# Patient Record
Sex: Female | Born: 1940 | Race: White | Hispanic: No | Marital: Married | Smoking: Never smoker
Health system: Southern US, Community
[De-identification: ages and names within clinical notes are randomized; demographics above are authoritative.]

## PROBLEM LIST (undated history)

## (undated) DIAGNOSIS — I639 Cerebral infarction, unspecified: Secondary | ICD-10-CM

## (undated) DIAGNOSIS — F329 Major depressive disorder, single episode, unspecified: Secondary | ICD-10-CM

## (undated) DIAGNOSIS — F32A Depression, unspecified: Secondary | ICD-10-CM

## (undated) DIAGNOSIS — K219 Gastro-esophageal reflux disease without esophagitis: Secondary | ICD-10-CM

## (undated) DIAGNOSIS — I10 Essential (primary) hypertension: Secondary | ICD-10-CM

## (undated) DIAGNOSIS — M1990 Unspecified osteoarthritis, unspecified site: Secondary | ICD-10-CM

## (undated) HISTORY — PX: WISDOM TOOTH EXTRACTION: SHX21

## (undated) HISTORY — PX: HIATAL HERNIA REPAIR: SHX195

---

## 1998-07-31 ENCOUNTER — Other Ambulatory Visit: Admission: RE | Admit: 1998-07-31 | Discharge: 1998-07-31 | Payer: Self-pay | Admitting: Obstetrics & Gynecology

## 2000-06-22 ENCOUNTER — Encounter: Admission: RE | Admit: 2000-06-22 | Discharge: 2000-06-22 | Payer: Self-pay | Admitting: Obstetrics & Gynecology

## 2000-06-22 ENCOUNTER — Encounter: Payer: Self-pay | Admitting: Obstetrics & Gynecology

## 2000-08-03 ENCOUNTER — Other Ambulatory Visit: Admission: RE | Admit: 2000-08-03 | Discharge: 2000-08-03 | Payer: Self-pay | Admitting: Obstetrics & Gynecology

## 2001-06-24 ENCOUNTER — Encounter: Admission: RE | Admit: 2001-06-24 | Discharge: 2001-06-24 | Payer: Self-pay | Admitting: Internal Medicine

## 2001-06-24 ENCOUNTER — Encounter: Payer: Self-pay | Admitting: Internal Medicine

## 2001-09-13 ENCOUNTER — Other Ambulatory Visit: Admission: RE | Admit: 2001-09-13 | Discharge: 2001-09-13 | Payer: Self-pay | Admitting: Obstetrics & Gynecology

## 2002-04-14 ENCOUNTER — Encounter: Admission: RE | Admit: 2002-04-14 | Discharge: 2002-04-14 | Payer: Self-pay | Admitting: Internal Medicine

## 2002-04-14 ENCOUNTER — Encounter: Payer: Self-pay | Admitting: Internal Medicine

## 2002-09-12 ENCOUNTER — Encounter: Payer: Self-pay | Admitting: Obstetrics & Gynecology

## 2002-09-12 ENCOUNTER — Encounter: Admission: RE | Admit: 2002-09-12 | Discharge: 2002-09-12 | Payer: Self-pay | Admitting: Obstetrics & Gynecology

## 2002-10-30 ENCOUNTER — Other Ambulatory Visit: Admission: RE | Admit: 2002-10-30 | Discharge: 2002-10-30 | Payer: Self-pay | Admitting: Obstetrics & Gynecology

## 2003-12-25 ENCOUNTER — Other Ambulatory Visit: Admission: RE | Admit: 2003-12-25 | Discharge: 2003-12-25 | Payer: Self-pay | Admitting: Obstetrics & Gynecology

## 2004-05-06 ENCOUNTER — Encounter: Admission: RE | Admit: 2004-05-06 | Discharge: 2004-05-06 | Payer: Self-pay | Admitting: Obstetrics & Gynecology

## 2004-06-18 ENCOUNTER — Other Ambulatory Visit: Admission: RE | Admit: 2004-06-18 | Discharge: 2004-06-18 | Payer: Self-pay | Admitting: Obstetrics & Gynecology

## 2004-12-10 ENCOUNTER — Other Ambulatory Visit: Admission: RE | Admit: 2004-12-10 | Discharge: 2004-12-10 | Payer: Self-pay | Admitting: Obstetrics & Gynecology

## 2005-06-17 ENCOUNTER — Encounter: Admission: RE | Admit: 2005-06-17 | Discharge: 2005-06-17 | Payer: Self-pay | Admitting: Obstetrics & Gynecology

## 2005-06-17 ENCOUNTER — Other Ambulatory Visit: Admission: RE | Admit: 2005-06-17 | Discharge: 2005-06-17 | Payer: Self-pay | Admitting: Obstetrics & Gynecology

## 2005-12-24 ENCOUNTER — Other Ambulatory Visit: Admission: RE | Admit: 2005-12-24 | Discharge: 2005-12-24 | Payer: Self-pay | Admitting: Obstetrics & Gynecology

## 2006-07-01 ENCOUNTER — Encounter: Admission: RE | Admit: 2006-07-01 | Discharge: 2006-07-01 | Payer: Self-pay | Admitting: Obstetrics & Gynecology

## 2007-11-16 ENCOUNTER — Observation Stay (HOSPITAL_COMMUNITY): Admission: EM | Admit: 2007-11-16 | Discharge: 2007-11-16 | Payer: Self-pay | Admitting: Emergency Medicine

## 2007-12-08 ENCOUNTER — Encounter: Admission: RE | Admit: 2007-12-08 | Discharge: 2007-12-08 | Payer: Self-pay | Admitting: Internal Medicine

## 2008-01-26 ENCOUNTER — Encounter: Admission: RE | Admit: 2008-01-26 | Discharge: 2008-01-26 | Payer: Self-pay | Admitting: General Surgery

## 2008-05-04 ENCOUNTER — Encounter: Admission: RE | Admit: 2008-05-04 | Discharge: 2008-05-04 | Payer: Self-pay | Admitting: Internal Medicine

## 2008-06-21 IMAGING — RF DG ESOPHAGUS
19 of 23 series · 19 of 24 positions shown · non-contrast
Comparison: [REDACTED] portable chest x-ray, 11/16/07.

CLINICAL DATA: Chest/arm pain.  Hiatus hernia.   Treatment for GERD.
DIAGNOSTIC BARIUM SWALLOW/ESOPHAGRAM:
TECHNIQUE: Double and single contrast technique with ingested 13 mm barium tablet.

[Series 1: run · 1 of 4 slices shown (1 of 19)]
[im 1/4]
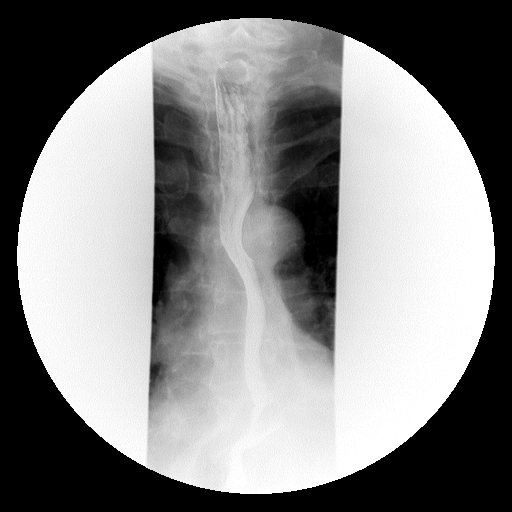

[Series 2: run · 1 of 14 slices shown (2 of 19)]
[im 1/14]
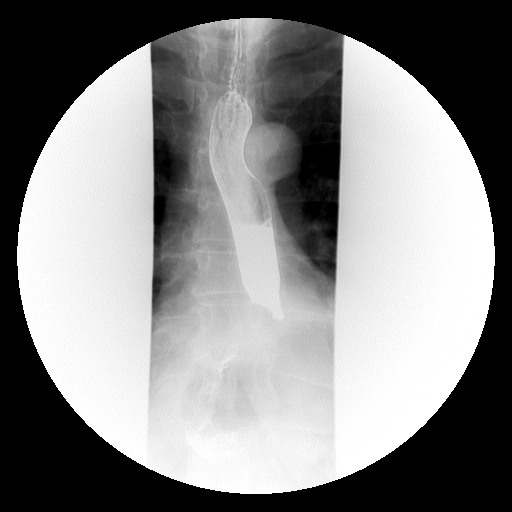

[Series 4: run · 1 of 1 slices shown (3 of 19)]
[im 1/1]
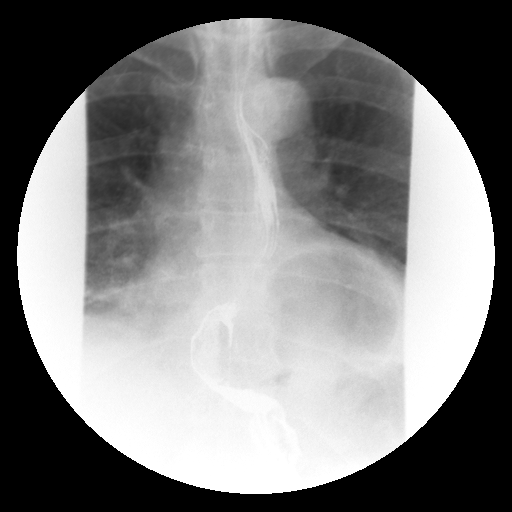

[Series 5: run · 1 of 6 slices shown (4 of 19)]
[im 1/6]
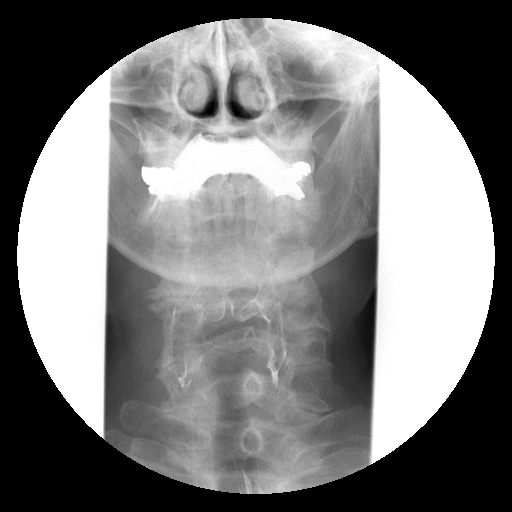

[Series 6: run · 1 of 1 slices shown (5 of 19)]
[im 1/1]
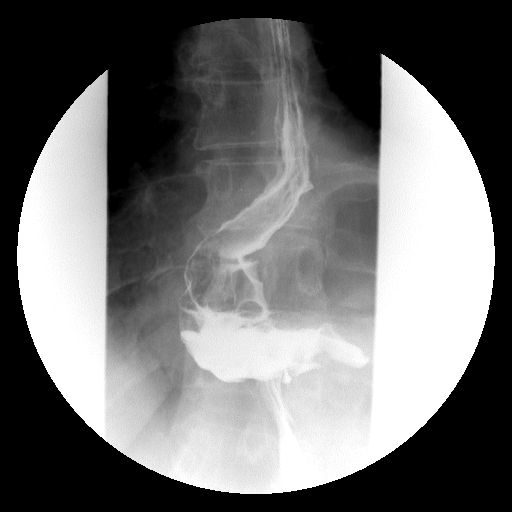

[Series 7: run · 1 of 5 slices shown (6 of 19)]
[im 1/5]
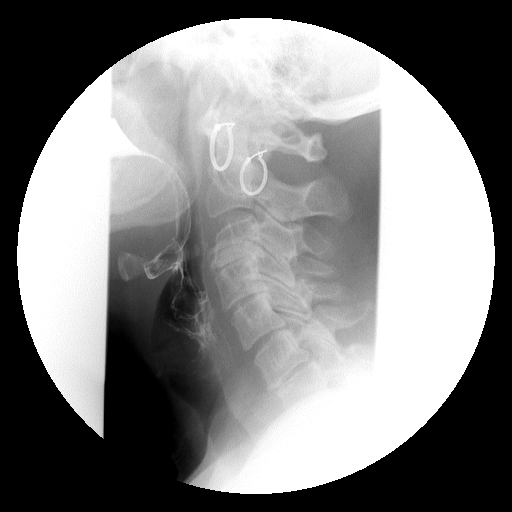

[Series 9: run · 1 of 5 slices shown (7 of 19)]
[im 1/5]
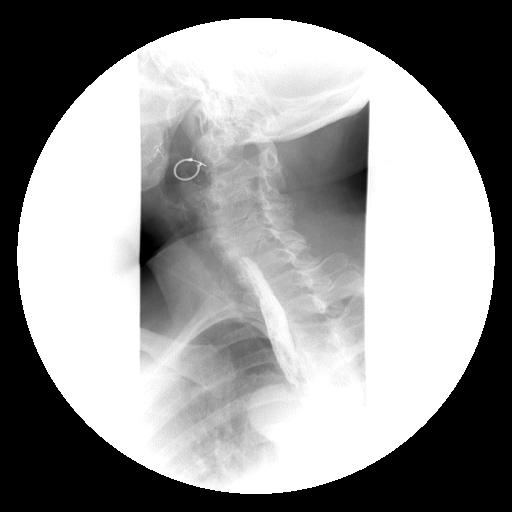

[Series 10: run · 1 of 3 slices shown (8 of 19)]
[im 1/3]
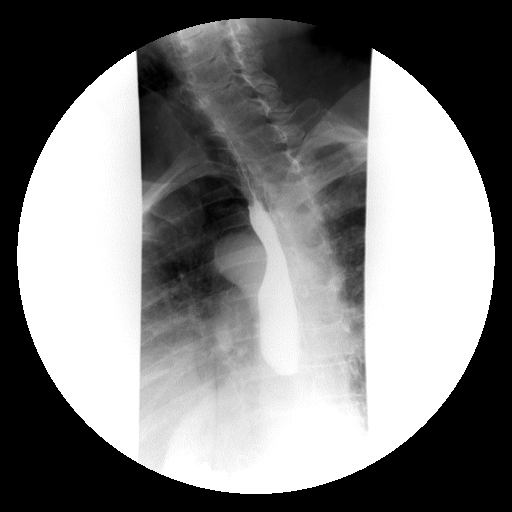

[Series 11: run · 1 of 22 slices shown (9 of 19)]
[im 1/22]
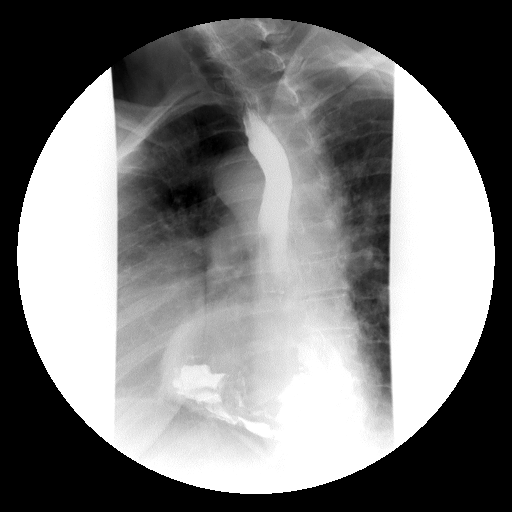

[Series 12: run · 1 of 30 slices shown (10 of 19)]
[im 30/30]
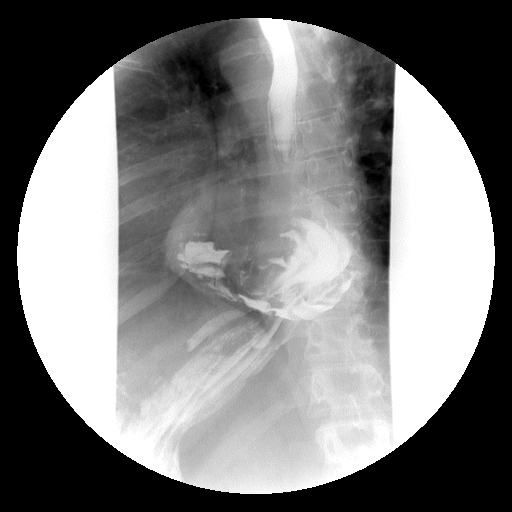

[Series 13: run · 1 of 8 slices shown (11 of 19)]
[im 1/8]
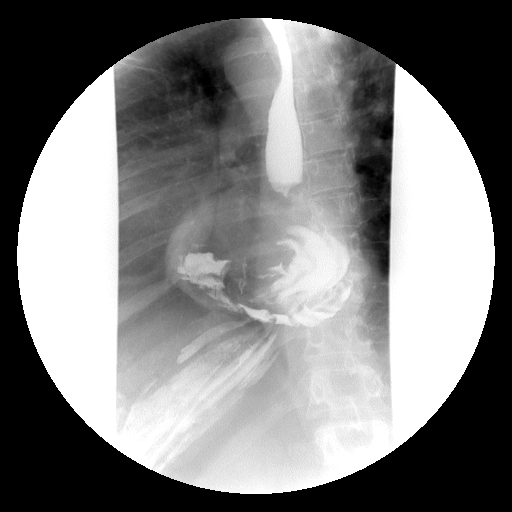

[Series 14: run · 1 of 1 slices shown (12 of 19)]
[im 1/1]
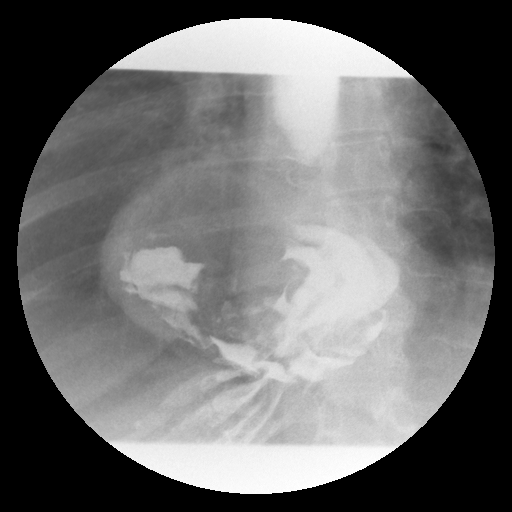

[Series 15: run · 1 of 1 slices shown (13 of 19)]
[im 1/1]
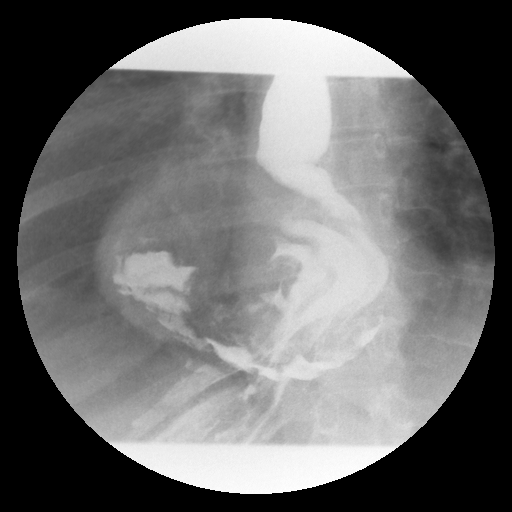

[Series 17: run · 1 of 1 slices shown (14 of 19)]
[im 1/1]
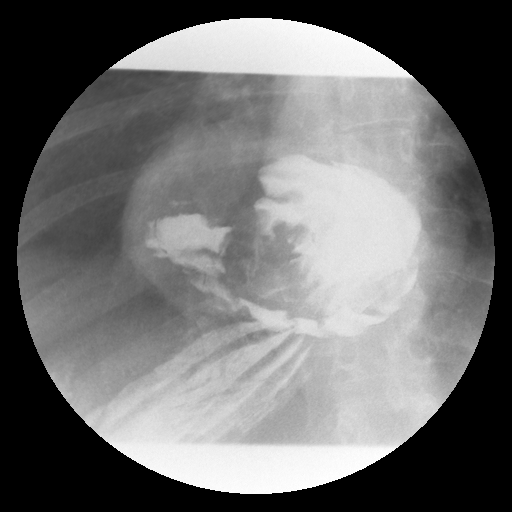

[Series 18: run · 1 of 1 slices shown (15 of 19)]
[im 1/1]
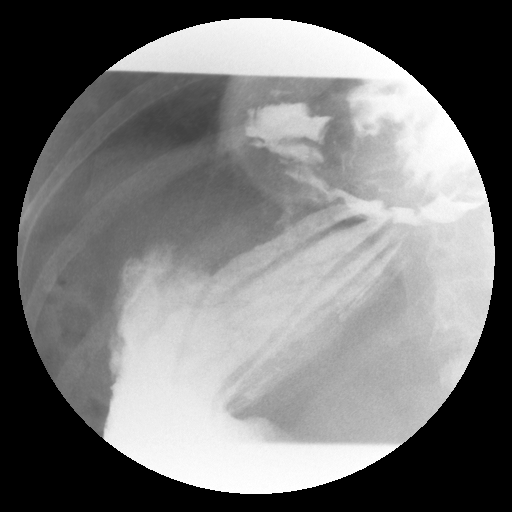

[Series 19: run · 1 of 1 slices shown (16 of 19)]
[im 1/1]
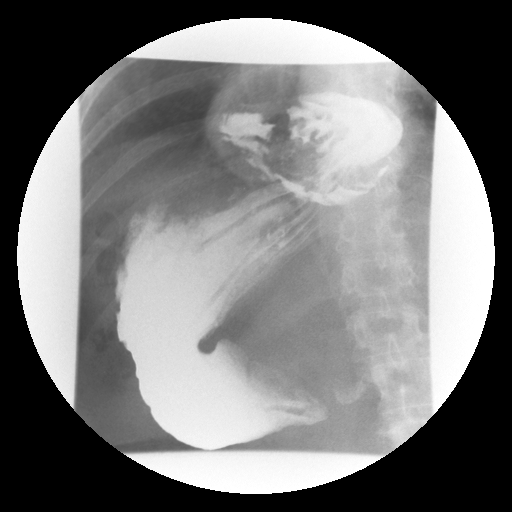

[Series 20: run · 1 of 1 slices shown (17 of 19)]
[im 1/1]
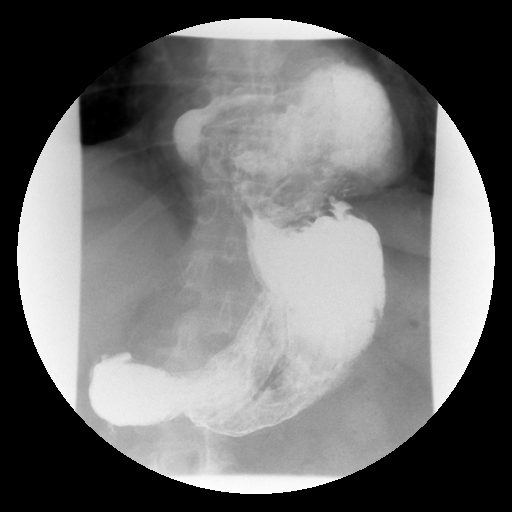

[Series 22: run · 1 of 1 slices shown (18 of 19)]
[im 1/1]
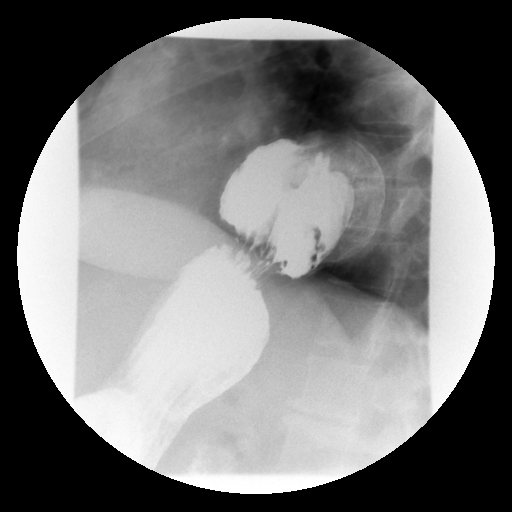

[Series 23: run · 1 of 1 slices shown (19 of 19)]
[im 1/1]
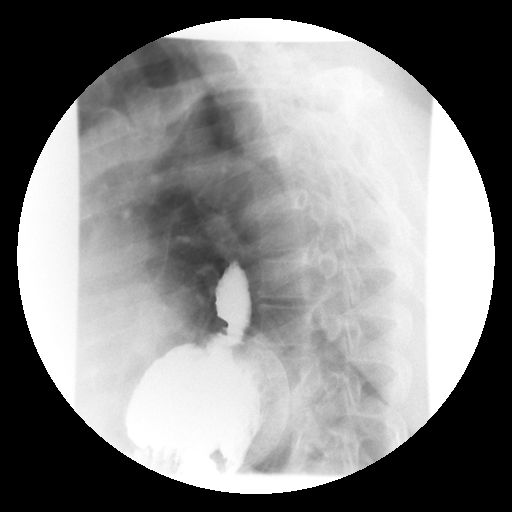

[19 of 24 positions shown; findings below may reference images not displayed]

FINDINGS: Moderately large (approximately one-third stomach) paraesophageal hiatus hernia is seen.  Normal antegrade peristalsis is seen through the cervical and thoracic esophagus with no obstruction to liquid barium nor ingested 13 mm barium tablet.  Slight spontaneous and Valsalva/water siphon induced gastroesophageal reflux was demonstrated in recumbent position.  No other significant intrinsic nor extrinsic esophageal nor gastric lesion is seen.   Advanced degenerative disk disease with slight posterior spondylosis C3-4 noted.
IMPRESSION: 1.  Moderately large (approximately one-third stomach) paraesophageal hernia.
2.  Slight gastroesophageal reflux. 
3.  Degenerative joint disease maximal C3-4 with slight posterior spondylosis.

## 2008-12-25 ENCOUNTER — Ambulatory Visit (HOSPITAL_COMMUNITY): Admission: RE | Admit: 2008-12-25 | Discharge: 2008-12-25 | Payer: Self-pay | Admitting: Gastroenterology

## 2009-01-03 ENCOUNTER — Encounter: Admission: RE | Admit: 2009-01-03 | Discharge: 2009-01-03 | Payer: Self-pay | Admitting: General Surgery

## 2009-02-22 ENCOUNTER — Inpatient Hospital Stay (HOSPITAL_COMMUNITY): Admission: RE | Admit: 2009-02-22 | Discharge: 2009-02-26 | Payer: Self-pay | Admitting: General Surgery

## 2009-02-22 ENCOUNTER — Encounter (INDEPENDENT_AMBULATORY_CARE_PROVIDER_SITE_OTHER): Payer: Self-pay | Admitting: General Surgery

## 2009-04-11 ENCOUNTER — Encounter: Admission: RE | Admit: 2009-04-11 | Discharge: 2009-04-11 | Payer: Self-pay | Admitting: General Surgery

## 2009-07-24 ENCOUNTER — Encounter: Admission: RE | Admit: 2009-07-24 | Discharge: 2009-07-24 | Payer: Self-pay | Admitting: Obstetrics & Gynecology

## 2009-09-25 ENCOUNTER — Encounter: Admission: RE | Admit: 2009-09-25 | Discharge: 2009-09-25 | Payer: Self-pay | Admitting: Orthopedic Surgery

## 2010-08-25 ENCOUNTER — Encounter: Admission: RE | Admit: 2010-08-25 | Discharge: 2010-08-25 | Payer: Self-pay | Admitting: Obstetrics & Gynecology

## 2010-11-18 ENCOUNTER — Encounter
Admission: RE | Admit: 2010-11-18 | Discharge: 2010-11-18 | Payer: Self-pay | Source: Home / Self Care | Attending: Internal Medicine | Admitting: Internal Medicine

## 2010-12-07 ENCOUNTER — Encounter: Payer: Self-pay | Admitting: Orthopedic Surgery

## 2011-02-07 ENCOUNTER — Emergency Department (HOSPITAL_COMMUNITY)
Admission: EM | Admit: 2011-02-07 | Discharge: 2011-02-07 | Disposition: A | Discharge status: Home / Self Care | Payer: Medicare Other | Attending: Emergency Medicine | Admitting: Emergency Medicine

## 2011-02-07 ENCOUNTER — Emergency Department (HOSPITAL_COMMUNITY): Payer: Medicare Other

## 2011-02-07 DIAGNOSIS — W1809XA Striking against other object with subsequent fall, initial encounter: Secondary | ICD-10-CM | POA: Insufficient documentation

## 2011-02-07 DIAGNOSIS — S0510XA Contusion of eyeball and orbital tissues, unspecified eye, initial encounter: Secondary | ICD-10-CM | POA: Insufficient documentation

## 2011-02-07 DIAGNOSIS — R51 Headache: Secondary | ICD-10-CM | POA: Insufficient documentation

## 2011-02-07 DIAGNOSIS — S0083XA Contusion of other part of head, initial encounter: Secondary | ICD-10-CM | POA: Insufficient documentation

## 2011-02-07 DIAGNOSIS — M542 Cervicalgia: Secondary | ICD-10-CM | POA: Insufficient documentation

## 2011-02-07 DIAGNOSIS — S0003XA Contusion of scalp, initial encounter: Secondary | ICD-10-CM | POA: Insufficient documentation

## 2011-02-07 DIAGNOSIS — I10 Essential (primary) hypertension: Secondary | ICD-10-CM | POA: Insufficient documentation

## 2011-02-07 DIAGNOSIS — IMO0002 Reserved for concepts with insufficient information to code with codable children: Secondary | ICD-10-CM | POA: Insufficient documentation

## 2011-02-26 LAB — CBC
Hemoglobin: 11.7 g/dL — ABNORMAL LOW (ref 12.0–15.0)
MCV: 79.3 fL (ref 78.0–100.0)
RDW: 16.4 % — ABNORMAL HIGH (ref 11.5–15.5)
WBC: 7.3 10*3/uL (ref 4.0–10.5)

## 2011-02-26 LAB — COMPREHENSIVE METABOLIC PANEL
Albumin: 3.7 g/dL (ref 3.5–5.2)
Alkaline Phosphatase: 76 U/L (ref 39–117)
BUN: 8 mg/dL (ref 6–23)
CO2: 27 mEq/L (ref 19–32)
Chloride: 105 mEq/L (ref 96–112)
Creatinine, Ser: 0.99 mg/dL (ref 0.4–1.2)
GFR calc Af Amer: >60 mL/min (ref >60)
GFR calc non Af Amer: 56 mL/min — ABNORMAL LOW (ref >60)
Potassium: 3.6 mEq/L (ref 3.5–5.1)
Sodium: 140 mEq/L (ref 135–145)
Total Bilirubin: 0.7 mg/dL (ref 0.3–1.2)
Total Protein: 6.7 g/dL (ref 6.0–8.3)

## 2011-02-26 LAB — URINALYSIS, ROUTINE W REFLEX MICROSCOPIC
Bilirubin Urine: NEGATIVE
Hgb urine dipstick: NEGATIVE
Ketones, ur: NEGATIVE mg/dL
Nitrite: NEGATIVE
Protein, ur: NEGATIVE mg/dL
Specific Gravity, Urine: 1.017 (ref 1.005–1.030)
Urobilinogen, UA: 0.2 mg/dL (ref 0.0–1.0)

## 2011-02-26 LAB — URINE MICROSCOPIC-ADD ON

## 2011-02-26 LAB — DIFFERENTIAL
Basophils Relative: 1 % (ref 0–1)
Monocytes Absolute: 0.4 10*3/uL (ref 0.1–1.0)
Neutrophils Relative %: 68 % (ref 43–77)

## 2011-03-31 NOTE — Op Note (Signed)
[name], [name] NO.:  [id number]   MEDICAL RECORD NO.:  [id number]          PATIENT TYPE:  INP   LOCATION:  1529                         FACILITY:  [facility]   PHYSICIAN:  Angelia Mould. Derrell Lolling, M.D.DATE OF BIRTH:  05/17/1941   DATE OF PROCEDURE:  02/22/2009  DATE OF DISCHARGE:                               OPERATIVE REPORT   PREOPERATIVE DIAGNOSIS:  Paraesophageal hernia and gastroesophageal  reflux disease.   POSTOPERATIVE DIAGNOSIS:  Paraesophageal hernia and gastroesophageal  reflux disease.   OPERATION PERFORMED:  1. Reduction and repair of paraesophageal hernia with Veritas mesh.  2. Laparoscopic Nissen fundoplication over a 50-French bougie.   SURGEON:  Angelia Mould. Derrell Lolling, M.D.   FIRST ASSISTANT:  Adolph Pollack, M.D.   OPERATIVE INDICATIONS:  This is a 70 year old Caucasian female who has  been having progressive problems with left chest pain and mild  obstructive symptoms and to a lesser degree, some reflux symptoms.  She  has been worked up and by upper GI she has a large paraesophageal  hernia.  Manometry shows good esophageal peristalsis and amplitude.  She  has had endoscopy, which shows no tumors.  Because of progressive  symptoms, she is brought to the operating room electively for repair.   OPERATIVE FINDINGS:  The patient had a large paraesophageal hernia with  about 50% of the stomach up in the chest.  There were very few adhesions  and I was able to reduce this reasonably well.  There was a large hernia  sac which was completely debrided.  The esophageal length looked normal  once we had reduced everything and dissected all the hernia sac out and  had gotten our esophageal length all the way.  The stomach, duodenum and  liver looked healthy.  The peritoneal surfaces, small bowel and large  bowel looked healthy.   OPERATIVE TECHNIQUE:  Following the induction of general endotracheal  anesthesia, a Foley catheter was placed.   Intravenous antibiotics were  given.  The abdomen was prepped and draped in a sterile fashion.  The  patient was identified as the correct patient and correct procedure and  a surgical time-out was held.   A 11-mm optical port was placed in the right rectus sheath about 5 cm  above the umbilicus.  This was uneventful and atraumatic.  Pneumoperitoneum was created.  A 5-mm port was placed in the subxiphoid  region and entering just to the left of the falciform ligament.  We  removed this and then placed an Iron Man liver retractor and fixed this  to the table, which elevated the left lobe of the liver quite nicely.  We placed an 11-mm trocar in the right upper quadrant and an 11-mm  trocar in the left subcostal region and a 5-mm trocar in the left  abdomen.   We reduced the stomach out of the chest.  We examined the right and left  crura.   We started the dissection by pulling the hernia sac down and dissecting  it away from the apex of the crura, and then dissecting it away from the  right and left crura, and  gently and bluntly teasing it out of the  mediastinum.  We identified the anterior vagus nerve and felt that we  had preserved that.  We then continued dissection down the right and  left crura as far posteriorly as we could.  We then took down all of the  short gastric vessels using the Harmonic scalpel.  We we then able to  create a window behind the gastroesophageal junction and pass a Penrose  drain around this for elevation.  We continued the dissection of the  hernia sac circumferentially.  We then had the hernia sac completely  elevated to where we could see it and it was quite bulky, so we just  slowly debrided this away from the upper stomach and gastroesophageal  junction using the Harmonic scalpel and removed it from the abdominal  cavity.  It was sent for pathologic examination.  At this point we had  good visualization of what appeared to be a normal esophageal  length.  We had about 6 cm of esophagus below the hiatus.  The hiatus was quite  large.  We had the crura completely dissected back to their posterior  confluence.  We then asked the anesthesiologist to insert a 50-French  lighted bougie.  We then closed the diaphragm with 4 interrupted sutures  of 0 Surgidac over Teflon pledgets.  This tightened up the hiatus quite  nicely but allowed adequate opening for the esophagus with the 50-French  bougie in place.   We pulled the bougie back.  We then took a piece of Veritas biologic  mesh.  This was an 8 x 14-inche piece of mesh and we trimmed it back to  about 8 x 9.  We cut a U-shaped collar in it.  We then inserted it into  the peritoneal cavity and placed it around the esophagus up above the  repair and well below the repair.  This completely covered the crural  repair.  This was then sutured in place with about 8 interrupted sutures  of 0 Surgidac, which were tied with tie knot device.  At this point we  had the diaphragm repaired and then reinforced it with the biologic  mesh.  There was no bleeding and everything looked good.  We inspected  the esophagus and the stomach.  We then examined the fundus of the  stomach and passed it behind the esophagus and then found a piece of  fundus on the left side.  We did a shoe shine maneuver to make sure we  had everything in place.  We then reinserted the bougie and then judged  the wrap.  We did a Nissen fundoplication with 3 interrupted sutures of  0 Surgidac.  We took a suitable piece of fundus on the left, a small  bite of esophagus anteriorly, and then a suitable piece of fundus on the  right.  We then tied all of these 3 sutures with the tie knot device.  This looked good.  We then secured the fundoplication itself to the  anterior rim of the diaphragmatic repair with 2 interrupted sutures of  #1 Surgidac, one on the right and one on the left. We placed a 19-French  Blake drain anteriorly up  into the mediastinum.  We brought this out  through one of the trocar sites in the left upper quadrant and sutured  the drain to the skin and connected it to a suction bulb.   We irrigated the area a little bit.  It  was actually quite clean.  There  was no bleeding.  The liver looked healthy.  The spleen was not injured.  There was no bleeding.  The stomach looked fine; really no bleeding or  bruising anywhere.  No sign of any injury.  We felt everything looked  good and so we removed all the trocars under direct vision.  We removed  the liver retractor under direct vision.  The pneumoperitoneum was  released.  We closed all the skin incisions with skin staples.  Clean  bandages were placed.  The patient was taken to the recovery room in  stable condition.   ESTIMATED BLOOD LOSS:  About 20-30 mL.   COMPLICATIONS:  None.   COUNTS:  Sponge, needle and instrument counts were correct.      Angelia Mould. Derrell Lolling, M.D.  Electronically Signed     HMI/MEDQ  D:  02/22/2009  T:  02/22/2009  Job:  [id number]   cc:   Johnella Moloney, MD   Armanda Magic, M.D.  Fax: [phone]   Danise Edge, M.D.  Fax: [phone]

## 2011-03-31 NOTE — Op Note (Signed)
NAMEEVELINA, LORE NO.:  [id number]   MEDICAL RECORD NO.:  [id number]          PATIENT TYPE:  AMB   LOCATION:  ENDO                         FACILITY:  [facility]   PHYSICIAN:  Danise Edge, M.D.   DATE OF BIRTH:  1941/07/16   DATE OF PROCEDURE:  DATE OF DISCHARGE:                               OPERATIVE REPORT   PROCEDURE:  Esophageal manometry.   PROCEDURE INDICATIONS:  Ms. Samari Bittinger has a symptomatic large  paraesophageal hernia.  She is undergoing a preoperative esophageal  manometry.   Lower esophageal sphincter data was based on five wet swallows.  Data  obtained from the lower esophageal sphincter may be inaccurate due to  the presence of her large paraesophageal hernia.  The resting lower  esophageal sphincter pressure was measured at 18.3 mmHg, which is  normal.  With five wet swallows, there is 80% relaxation of the lower  esophageal sphincter for 9.1 seconds.  The residual pressure after  relaxation was measured at 3.6 mmHg which is normal.   Lower esophageal body study:  Data obtained from the esophageal body was  based on six wet swallows.  There was 83% peristaltic contractions and  17% retrograde contractions.  Distal esophageal contraction amplitude  was normal at 54 mmHg.   Upper esophageal sphincter function:  With swallows, there is 93%  relaxation of the upper esophageal sphincter which appears coordinated.           ______________________________  Danise Edge, M.D.     MJ/MEDQ  D:  02/08/2009  T:  02/09/2009  Job:  [id number]   cc:   Angelia Mould. Derrell Lolling, M.D.  [address]  [place]  [place] [address]

## 2011-03-31 NOTE — Consult Note (Signed)
[name], RAYOS NO.:  [id number]   MEDICAL RECORD NO.:  [id number]          PATIENT TYPE:  INP   LOCATION:  3735                         FACILITY:  [facility]   PHYSICIAN:  Armanda Magic, M.D.     DATE OF BIRTH:  16-Jun-1941   DATE OF CONSULTATION:  11/16/2007  DATE OF DISCHARGE:                                 CONSULTATION   REFERRING PHYSICIAN:  Candyce Churn, M.D.   CHIEF COMPLAINT:  Chest pain.   HISTORY OF PRESENT ILLNESS:  This is a 70 year old female with no known  history of coronary disease who described an episode of indigestion  while eating pizza last night.  Apparently she had finished one piece of  pizza and then developed pressure in her midsternal area that she said  it felt like her entire meal was stuck in her chest.  She said she could  not take a deep breath in because she felt so full.  She went home and  then developed a pain sensation in her left shoulder radiating down her  left arm and up into the back of her neck.  She went home and took 2  Tums without any improvement.  She called the [facility]  nurse who told her to take four baby aspirin which she took and then had  relief of her discomfort.  She presented to the emergency room.  She  said she had about 3 hours of constant discomfort and she has had no  further episodes.  She says that she has been told in the past that she  has a very large hiatal hernia and has pretty significant GERD for which  she used to have to sleep sitting up but now has been fairly well  controlled on Protonix.   ALLERGIES:  PENICILLIN, SULFA.   MEDICATIONS:  1. Norvasc 5 mg a day.  2. Protonix 40 mg a day.  3. Zocor 40 mg a day.   SOCIAL HISTORY:  She denies any tobacco or alcohol use.  No IV drug use.   FAMILY HISTORY:  Her father died of an MI at 73.  He was a smoker.   PAST MEDICAL HISTORY:  Significant for hypertension, gastroesophageal  reflux disease and dyslipidemia.   PAST SURGICAL HISTORY:  None.   PHYSICAL EXAM:  VITAL SIGNS:  Blood pressure is 146/87, respirations 18.  She is afebrile.  Pulse 71.  GENERAL:  This is a well-developed, obese white female in no acute  distress.  HEENT:  Is benign.  NECK:  Supple without lymphadenopathy.  Carotid upstrokes +2  bilaterally, no bruits.  LUNGS:  Clear to auscultation throughout.  HEART:  Regular rate and rhythm.  No murmurs, rubs or gallops.  Normal  S1, S2.  ABDOMEN:  Benign.  EXTREMITIES:  No edema.   LABS:  Sodium 137, potassium 3.6, chloride 107, BUN 18, creatinine 0.9,  glucose 120, white cell count 13.1, hematocrit 32.4, hemoglobin 10.8,  platelet count 407.  Point of care enzymes are negative x1.  CPK 75, MB  1.1, troponin 0.02.  EKG shows sinus rhythm at 89 beats  per minute with  RSR prime in V1 and nonspecific T-wave abnormality.  Chest x-ray shows  large hiatal hernia.   ASSESSMENT:  1. Chest pain syndrome, atypical and most likely secondary to      gastroesophageal reflux with esophageal spasm especially given the      fact that her chest x-ray shows a very markedly enlarged hiatal      hernia.  Cardiac enzymes are negative thus far.  EKG has had some      nonspecific T-wave changes.  2. Known hiatal hernia with gastroesophageal reflux disease.  3. Dyslipidemia.  4. Hypertension.  5. Obesity.   PLAN:  1. Will check another set of cardiac enzymes.  If 2 sets of cardiac      enzymes are negative given 3 hours of constant pain would unlikely      be due to coronary ischemia and therefore I think would be okay to      send home.  We will schedule an outpatient stress Cardiolite study      on her next week in our office.      Armanda Magic, M.D.  Electronically Signed     TT/MEDQ  D:  11/16/2007  T:  11/16/2007  Job:  [id number]   cc:   Candyce Churn, M.D.  Deirdre Peer. Polite, M.D.

## 2011-03-31 NOTE — Discharge Summary (Signed)
[name], [name] NO.:  [id number]   MEDICAL RECORD NO.:  [id number]          PATIENT TYPE:  INP   LOCATION:  1529                         FACILITY:  [facility]   PHYSICIAN:  Angelia Mould. Derrell Lolling, M.D.DATE OF BIRTH:  06-11-1941   DATE OF ADMISSION:  02/22/2009  DATE OF DISCHARGE:  02/26/2009                               DISCHARGE SUMMARY   FINAL DIAGNOSES:  1. Large paraesophageal hernia.  2. Gastroesophageal reflux disease.  3. Hypertension.   OPERATION PERFORMED:  Laparoscopic repair of paraesophageal hernia with  mesh, Nissen fundoplication.   SURGEON:  Angelia Mould. Derrell Lolling, M.D.   FIRST ASSISTANT:  Adolph Pollack, M.D.   OPERATIVE INDICATION:  This is a 70 year old Caucasian female who has  had symptoms with pain in the left chest and left arm after meals.  She  had been evaluated in the emergency room and myocardial infarction has  been ruled out.  She takes Protonix and that helps partially but does  not control her symptoms.  She has had an upper GI which shows a large  paraesophageal hernia.  She has had a full GI workup.  Her symptoms are  progressive and she has requested that we proceed with repair.   HOSPITAL COURSE:  On the day of admission the patient was taken to the  operating room and underwent laparoscopic reduction and repair of her  large paraesophageal hernia.  The crural closure was reinforced with  biologic mesh and a Nissen fundoplication over a 50 French bougie was  performed.  The surgery was uneventful.  Postoperatively she had a  stable course.  She had some dysphagia the first 24 hours.  Her  Gastrografin swallow showed some edema of the wrap but it was not  completely obstructing.  Forty-eight hours after her surgery she was  swallowing liquids without any difficulty and feeling fine.  We were  able to advance her to full liquid diet which she tolerated.  We had no  unusual drainage and we removed the drain and discharged her  home on  April 13 on a full liquid diet.  She was given a prescription for  Vicodin for pain.  She was told to continue her usual medications which  include:   DISCHARGE MEDICATIONS:  1. Azor 5 mg a day.  2. Crestor 5 mg a day.  3. Protonix 1 tablet daily.  4. Prozac.  5. Multivitamins.   FOLLOWUP:  She was asked to return to see me in the office in about 10-  14 days.      Angelia Mould. Derrell Lolling, M.D.  Electronically Signed     HMI/MEDQ  D:  03/09/2009  T:  03/10/2009  Job:  [id number]   cc:   Candyce Churn, M.D.  Fax: [phone]   Armanda Magic, M.D.  Fax: [phone]   Danise Edge, M.D.  Fax: [phone]

## 2011-03-31 NOTE — H&P (Signed)
NAMEALEXX, GIAMBRA NO.:  [id number]   MEDICAL RECORD NO.:  [id number]          PATIENT TYPE:  EMS   LOCATION:  MAJO                         FACILITY:  [facility]   PHYSICIAN:  Hollice Espy, M.D.DATE OF BIRTH:  [birth year]   DATE OF ADMISSION:  11/16/2007  DATE OF DISCHARGE:                              HISTORY & PHYSICAL   PRIMARY CARE PHYSICIAN:  Candyce Churn, M.D.   [facility]   CHIEF COMPLAINT:  Chest pressure.   HISTORY OF PRESENT ILLNESS:  The patient is a 70 year old white female  with past medical history of hypertension, hyperlipidemia and GERD who  has been in previously good health, and this evening after dinner  started noticing she was complaining of some shortness of breath and  difficulty taking a deep inspiration. She then went home and then  started noticing some mid sternal chest pressure.  This also had some  associated symptoms, some aching in her left shoulder going a little bit  down her left arm and up the side of her neck.  These symptoms persisted  for several hours and only felt better after she crushed and took four  aspirin as was advised per 9-1-1.  She then came into the emergency  room.  An EKG showed normal sinus rhythm and cardiac markers were  essentially unremarkable.  Other lab work noted a white count of 13 and  a chest x-ray noted large hiatal hernia, but no evidence of any active  disease.  Currently, the patient is doing well.  She says that ever  since the aspirin resolved her chest and shoulder discomfort, she has  not had a problems.  She currently denies any headaches, vision changes,  dysphagia, chest pain, palpitations, shortness of breath, wheeze, cough,  abdominal pain, hematuria, dysuria, constipation, diarrhea, focal  extremity numbness, weakness or pain.  Review of systems otherwise  negative.   PAST MEDICAL HISTORY:  1. Hypertension.  2. GERD.  3. Hyperlipidemia.   MEDICATIONS:  Azor, Protonix, Prozac.  She needs to be started on  cholesterol medication, but has not yet had her appointment with her PCP  to start that.   ALLERGIES:  PENICILLIN, SULFA.   SOCIAL HISTORY:  She denies any alcohol, alcohol or drug use.   FAMILY HISTORY:  Negative for CAD.   PHYSICAL EXAMINATION:  VITAL SIGNS:  Temperature 97, heart rate 75,  blood pressure 122/76, although, since then her blood pressure has  actually gone up to 169/99, respirations 18, O2 saturation 98% on room  air.  GENERAL:  Alert and oriented x3.  No apparent distress.  HEENT:  Normocephalic, atraumatic.  Mucous membranes moist.  No carotid  bruits.  HEART:  Regular rate and rhythm.  S1, S2.  Soft 2/6 systolic ejection  murmur.  LUNGS:  Clear to auscultation bilaterally.  ABDOMEN:  Soft, nontender, nondistended.  Positive bowel sounds.  EXTREMITIES:  No cyanosis, clubbing or edema.   STUDIES:  Chest x-ray is as per HPI as is EKG.  White count 13.1, H&H  10.8 and 32, MCV 82, platelet count 407, 77% neutrophils.  Sodium 137,  potassium 3.6, chloride 107, bicarbonate 23, BUN 18, creatinine 0.9,  glucose 120, CPK 59.5, MB 1.3, troponin I less than 0.05.   ASSESSMENT:  1. Chest pressure with migratory symptoms.  Will check two more sets      of cardiac markers, consult [facility].  The patient will      likely need an Adenosine Cardiolite stress test as she is unable to      run on the treadmill.  2. Leukocytosis.  No shift.  This may be some stress margination,      possibly from a cardiac event.  3  Hypertension.  Continue Azor.  1. Gastroesophageal reflux disease.  Continue Protonix.  2. Hyperlipidemia.      Hollice Espy, M.D.  Electronically Signed     SKK/MEDQ  D:  11/16/2007  T:  11/16/2007  Job:  [id number]   cc:   Candyce Churn, M.D.  [facility]

## 2011-07-01 ENCOUNTER — Other Ambulatory Visit: Payer: Self-pay | Admitting: Dermatology

## 2011-07-31 ENCOUNTER — Other Ambulatory Visit: Payer: Self-pay | Admitting: Internal Medicine

## 2011-07-31 ENCOUNTER — Ambulatory Visit
Admission: RE | Admit: 2011-07-31 | Discharge: 2011-07-31 | Disposition: A | Discharge status: Home / Self Care | Payer: Medicare Other | Source: Ambulatory Visit | Attending: Internal Medicine | Admitting: Internal Medicine

## 2011-07-31 ENCOUNTER — Inpatient Hospital Stay (HOSPITAL_COMMUNITY)
Admission: EM | Admit: 2011-07-31 | Discharge: 2011-08-04 | DRG: 066 | Disposition: A | Discharge status: Home / Self Care | Payer: Medicare Other | Attending: Internal Medicine | Admitting: Internal Medicine

## 2011-07-31 DIAGNOSIS — R531 Weakness: Secondary | ICD-10-CM

## 2011-07-31 DIAGNOSIS — Y998 Other external cause status: Secondary | ICD-10-CM

## 2011-07-31 DIAGNOSIS — E785 Hyperlipidemia, unspecified: Secondary | ICD-10-CM | POA: Diagnosis present

## 2011-07-31 DIAGNOSIS — R42 Dizziness and giddiness: Secondary | ICD-10-CM

## 2011-07-31 DIAGNOSIS — R5381 Other malaise: Secondary | ICD-10-CM | POA: Diagnosis present

## 2011-07-31 DIAGNOSIS — R51 Headache: Secondary | ICD-10-CM

## 2011-07-31 DIAGNOSIS — R279 Unspecified lack of coordination: Secondary | ICD-10-CM | POA: Diagnosis present

## 2011-07-31 DIAGNOSIS — W19XXXA Unspecified fall, initial encounter: Secondary | ICD-10-CM | POA: Diagnosis present

## 2011-07-31 DIAGNOSIS — R202 Paresthesia of skin: Secondary | ICD-10-CM

## 2011-07-31 DIAGNOSIS — I10 Essential (primary) hypertension: Secondary | ICD-10-CM | POA: Diagnosis present

## 2011-07-31 DIAGNOSIS — I6350 Cerebral infarction due to unspecified occlusion or stenosis of unspecified cerebral artery: Principal | ICD-10-CM | POA: Diagnosis present

## 2011-07-31 DIAGNOSIS — I519 Heart disease, unspecified: Secondary | ICD-10-CM | POA: Diagnosis present

## 2011-07-31 MED ORDER — GADOBENATE DIMEGLUMINE 529 MG/ML IV SOLN
15.0000 mL | Freq: Once | INTRAVENOUS | Status: AC | PRN
  Administered 2011-07-31: 15 mL via INTRAVENOUS

## 2011-08-01 LAB — CBC
HCT: 37.7 % (ref 36.0–46.0)
Hemoglobin: 13.4 g/dL (ref 12.0–15.0)
MCH: 30 pg (ref 26.0–34.0)
MCHC: 35.5 g/dL (ref 30.0–36.0)
MCV: 84.3 fL (ref 78.0–100.0)
Platelets: 300 10*3/uL (ref 150–400)
RBC: 4.38 MIL/uL (ref 3.87–5.11)
RDW: 12.5 % (ref 11.5–15.5)
WBC: 7.8 10*3/uL (ref 4.0–10.5)

## 2011-08-01 LAB — LIPID PANEL
Cholesterol: 219 mg/dL — ABNORMAL HIGH (ref 0–200)
HDL: 62 mg/dL (ref >39)
LDL Cholesterol: 124 mg/dL — ABNORMAL HIGH (ref 0–99)
Total CHOL/HDL Ratio: 3.5 RATIO
Triglycerides: 108 mg/dL (ref <150)
Triglycerides: 126 mg/dL (ref <150)
VLDL: 25 mg/dL (ref 0–40)

## 2011-08-01 LAB — CK TOTAL AND CKMB (NOT AT ARMC): Total CK: 53 U/L (ref 7–177)

## 2011-08-01 LAB — COMPREHENSIVE METABOLIC PANEL
ALT: 10 U/L (ref 0–35)
AST: 13 U/L (ref 0–37)
Albumin: 3.7 g/dL (ref 3.5–5.2)
Alkaline Phosphatase: 82 U/L (ref 39–117)
Calcium: 9.8 mg/dL (ref 8.4–10.5)
Potassium: 3.7 mEq/L (ref 3.5–5.1)
Sodium: 139 mEq/L (ref 135–145)
Total Protein: 6.7 g/dL (ref 6.0–8.3)

## 2011-08-01 LAB — PROTIME-INR: INR: 0.84 (ref 0.00–1.49)

## 2011-08-01 LAB — DIFFERENTIAL
Basophils Absolute: 0.1 10*3/uL (ref 0.0–0.1)
Eosinophils Absolute: 0.2 10*3/uL (ref 0.0–0.7)
Eosinophils Relative: 2 % (ref 0–5)
Lymphocytes Relative: 41 % (ref 12–46)
Lymphs Abs: 3.2 10*3/uL (ref 0.7–4.0)
Neutrophils Relative %: 49 % (ref 43–77)

## 2011-08-01 LAB — HEMOGLOBIN A1C: Hgb A1c MFr Bld: 5.3 % (ref <5.7)

## 2011-08-01 LAB — TROPONIN I: Troponin I: <0.3 ng/mL (ref <0.30)

## 2011-08-01 LAB — TSH: TSH: 2.176 u[IU]/mL (ref 0.350–4.500)

## 2011-08-01 NOTE — H&P (Signed)
[name], [name] NO.:  [id number]  MEDICAL RECORD NO.:  [id number]  LOCATION:  [facility]                         FACILITY:  [facility]  PHYSICIAN:  Freeman Caldron, MD    DATE OF BIRTH:  08/31/1941  DATE OF ADMISSION:  07/31/2011 DATE OF DISCHARGE:                             HISTORY & PHYSICAL   PRIMARY CARE PHYSICIAN:  Dr. Kevan Ny.  CHIEF COMPLAINT:  Disk herniation.  HISTORY OF PRESENT ILLNESS:  Ms. Lizer is a very delightful 69 year old female with a history of hypertension, dyslipidemia, and depression, who presents for an acute stroke.  She was seen by her primary care doctor earlier today and was noted to have increasing frequent episodes of kind of atypical coordination.  She states "I can move my right arm and leg, but I cannot make it do what I want to."  She actually suffered a fall early Thursday morning because of the issues with her coordination on the right side.  She denies any trauma event.  This has been a kind of on and off throughout the last day and half or so.  She said she actually had 1 similar episode about a month ago that lasted may be 45 minutes or so, but then completely resolved.  Currently, she has no other complaints such as fevers, chills, weight loss, pain or discomfort, headaches, diplopia or visual disturbances, chest pain, shortness of breath, nausea, vomiting, diarrhea, melena, edema, PND, orthopnea.  PAST MEDICAL HISTORY: 1. Hypertension. 2. Dyslipidemia. 3. Depression. 4. She had a paraesophageal hernia repair in 2010.  SOCIAL HISTORY:  She lives in [place] with her husband and her cat. 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  No tobacco.  No EtOH.  No drug use.  FAMILY HISTORY:  Mom passed away at 56 secondary to her old age.  Father passed away at 47 secondary to MI.  ALLERGIES:  PENICILLIN causes a rash and swelling.  SULFA, nausea.  MEDICATIONS: 1. She takes  Azor 10/40 half tab p.o. every daily. 2. Aspirin 81 though she has not been taking this for the last 2     weeks. 3. Prozac 40 one daily.  A 12-point review of systems was conducted, negative with the exception of pertinent as per HPI.  PHYSICAL EXAMINATION:  VITAL SIGNS:  Temperature 98.1, blood pressure 142/79 with a heart rate of some 65, respiratory rate 18, satting 99% on room air. GENERAL:  She is a middle-to-older age Caucasian female in no acute distress, very pleasant to conversation. HEENT:  Anicteric.  Pupils equally round and reactive to light and accommodation.  Extraocular movements intact. NECK:  Negative bilaterally for bruits. CARDIOVASCULAR:  Regular S1 and S2.  She has no murmurs, clicks, or rubs.  PMI is nondisplaced. LUNGS:  Clear to auscultation bilaterally. SKIN:  Warm.  No rash. ABDOMEN:  Soft, nontender, nondistended.  No organomegaly.  Positive bowel sounds. EXTREMITIES:  Warm and well perfused.  No clubbing, cyanosis, or edema. MUSCULOSKELETAL:  Good muscle bulk and intact throughout. NEUROLOGIC:  Alert and oriented x3.  Cranial nerves II through XII appeared to be grossly intact.  Patient is notable to have an abnormal Romberg  and difficulty with finger-nose-finger test.  She can iniate movement with the right lower extremity though has difficulty completing the task.  DIAGNOSTIC STUDIES AND RADIOLOGY:  MRI of brain, acute small vessel infarct of left paramedian pons.  No mass or hemorrhage effect.  There is solitary and chronic microhemorrhage in the right cerebellum.  LABORATORY DATA:  White count 7.8, hemoglobin 13.2, platelets 300. Chemistry, 139, 3.7, 105, 25, 18, 0.8, 6.  LFTs unremarkable.  INR 0.7.  ASSESSMENT AND PLAN:  A 70 year old female with hypertension, dyslipidemia, recent loss of coordination in the right upper and lower extremity, found to have a new pons cerebrovascular accident. 1. We will admit to Hospitalist Service.  Monitor on  telemetry.  We     will ask Neurology to weigh in.  We will allow some permissive     hypertension and hold blood pressure medications for the next 36 hrs.  She will     complete a speech eval and start heart-healthy diet.  We will go     ahead and get an EKG, 2-D echo, and carotid Dopplers be performed.     She will be maintained on enoxaparin for DVT prophylaxis.  She is a     nontobacco user and thus will not need any education in that     regard.  Thus, we will go ahead and continue her on an aspirin 325     mg p.o. daily.  Follow her up after an hour. 2. Hypertension.  Hold her amlodipine/olmesartan 10/40 one-half tablet     daily for the time being. 3. Dyslipidemia.  I do not really see where she is going.  We are     going to go ahead and check a lipid panel in a.m. as well. 4. FEN/gastrointestinal prophylaxis.  No fluids.  Follow     electrolytes.  Heart-health diet once speech eval.  Enoxaparin for     DVT. 5. Full code.     Freeman Caldron, MD     LR/MEDQ  D:  08/01/2011  T:  08/01/2011  Job:  [id number]  Electronically Signed by Freeman Caldron  on 08/01/2011 07:31:42 PM

## 2011-08-02 LAB — CARDIAC PANEL(CRET KIN+CKTOT+MB+TROPI)
Relative Index: INVALID (ref 0.0–2.5)
Relative Index: INVALID (ref 0.0–2.5)
Total CK: 45 U/L (ref 7–177)
Troponin I: <0.3 ng/mL (ref <0.30)

## 2011-08-03 DIAGNOSIS — I6330 Cerebral infarction due to thrombosis of unspecified cerebral artery: Secondary | ICD-10-CM

## 2011-08-03 LAB — BASIC METABOLIC PANEL
Calcium: 9.4 mg/dL (ref 8.4–10.5)
Chloride: 103 mEq/L (ref 96–112)
Creatinine, Ser: 0.81 mg/dL (ref 0.50–1.10)
GFR calc Af Amer: >60 mL/min (ref >60)

## 2011-08-07 ENCOUNTER — Ambulatory Visit: Payer: Medicare Other | Attending: Internal Medicine | Admitting: Physical Therapy

## 2011-08-07 DIAGNOSIS — Z5189 Encounter for other specified aftercare: Secondary | ICD-10-CM | POA: Insufficient documentation

## 2011-08-07 DIAGNOSIS — I69998 Other sequelae following unspecified cerebrovascular disease: Secondary | ICD-10-CM | POA: Insufficient documentation

## 2011-08-07 DIAGNOSIS — R269 Unspecified abnormalities of gait and mobility: Secondary | ICD-10-CM | POA: Insufficient documentation

## 2011-08-07 DIAGNOSIS — R5381 Other malaise: Secondary | ICD-10-CM | POA: Insufficient documentation

## 2011-08-07 DIAGNOSIS — M6281 Muscle weakness (generalized): Secondary | ICD-10-CM | POA: Insufficient documentation

## 2011-08-10 ENCOUNTER — Ambulatory Visit: Payer: Medicare Other | Admitting: Physical Therapy

## 2011-08-10 ENCOUNTER — Ambulatory Visit: Payer: Medicare Other | Admitting: Occupational Therapy

## 2011-08-12 ENCOUNTER — Ambulatory Visit: Payer: Medicare Other | Admitting: Physical Therapy

## 2011-08-12 ENCOUNTER — Ambulatory Visit: Payer: Medicare Other | Admitting: Occupational Therapy

## 2011-08-14 ENCOUNTER — Ambulatory Visit: Payer: Medicare Other | Admitting: Occupational Therapy

## 2011-08-14 ENCOUNTER — Ambulatory Visit: Payer: Medicare Other | Admitting: Physical Therapy

## 2011-08-14 NOTE — Consult Note (Signed)
[name], [name] NO.:  [id number]  MEDICAL RECORD NO.:  [id number]  LOCATION:  3003                         FACILITY:  [facility]  PHYSICIAN:  Thana Farr, MD    DATE OF BIRTH:  [birth year]  DATE OF CONSULTATION:  08/01/2011 DATE OF DISCHARGE:                                CONSULTATION   CHIEF COMPLAINT:  Stroke.  HISTORY OF PRESENT ILLNESS:  This 70 year old white female who had onset of dizziness and right-sided clumsiness during the night, Wednesday. Her symptoms were intermittent through Thursday.  She had MRI of the brain on Friday ordered by her primary care provider, which confirmed an infarct and was sent to the hospital for further workup.  The patient complains of being off balance, dizzy, and ntrouble coordinating use the right upper extremity and hand.   Neuro consult is now requested for further evaluation.  PAST MEDICAL HISTORY:  1. Hypertension. 2. Hyperlipidemia (intolerant to Crestor.  Has not tried other     statins). 3. Depression. 4. History of hiatal hernia repair in 2010.  CURRENT MEDICATIONS: 1. Azor 10/40 one half p.o. daily. 2. Aspirin 325 mg (had been taking 81 mg aspirin at home but had been     off this 2 weeks prior to this admission). 3. Prozac 40 mg a day. 4. Estradiol 1 mg p.o. daily. 5. Prometrium 200 mg p.o. daily. 6. Vitamin B and vitamin D.  ALLERGIES:  PENICILLIN causing rash and SULFA causes nausea.  FAMILY HISTORY:  Noncontributory to this consult.  SOCIAL HISTORY:  The patient is married and retired.  She is a mother of 2 and watches her grandchildren during the day.  No tobacco, alcohol or illicit drug use.  REVIEW OF SYSTEMS:  She complains of dizziness which she describes more of vertigo type dizziness but no other visual disturbance.  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  She has a mild left frontal headache for the last couple of days which is new for her.  She does not  generally have headaches.  She is denying any swallowing difficulties, coughing, chest pain, or shortness of breath.  No palpitations.  No trouble controlling bowel or bladder.  No history of seizure or stroke previously.  PHYSICAL EXAMINATION:  VITAL SIGNS:  Temperature is 98.3, blood pressure 123/79, pulse 69, respirations 16, SAO2 97% on room air. NEURO:  The patient is alert and oriented x3 with fluent speech and no evidence of aphasia.  She follows multi-step commands.  PERRL.  EOMI. Visual fields are full and intact.  Conjugate gaze.  No nystagmus. Tongue is midline and smile symmetric.  Shoulder shrug is intact. Strength is 5/5 x4 extremities.  Grips are equal.  She has a drift at the right upper extremity.  She has impaired RAMI on the right, intact to the left.  Sensation to pinprick and light touch is intact throughout.  DTRs are 2+ in the bilateral upper and lower extremities and equivocal plantars bilaterally.  Finger-to-nose on the right is dysmetric, on the left is intact.  Heel-to-shin is intact bilaterally.  LABORATORY DATA:  Cholesterol 219, triglyceride 108, HDL 62, and LDL 135.  White blood cells 9.6, hemoglobin 13.4,  and platelets 303.  Sodium is 139, potassium 3.7, BUN 18, creatinine 0.86 and glucose 93.  First cardiac enzymes are negative.  Her MRI of the brain performed July 31, 2011, with and without contrast showed acute small vessel infarcts in the left paramedian pons without mass or hemorrhage.  She has nonspecific white matter disease, otherwise.  ASSESSMENT: 1. This is a 70 year old white female with hypertension and statin     intolerance (ongoing hyperlipidemia) now with declared left     paramedian pons infarct.  The patient's symptoms include primarily     clumsiness of the right upper and lower extremities especially the     right hand and some dizziness. 2. Hyperlipidemia - consider Pravachol treatment due to problems with     other statin  intolerance.  PLAN: 1. Two-D echo. 2. Carotid Dopplers. 3. PT/OT. 4. Aspirin 325 a day (continue). 5. Primary provider to address need for ongoing estrogen/progesterone therapy as an outpatient.  The patient has been seen and examined by Dr. Thana Farr.  She agrees with the above plan.  Thank you for allowing [place] to assist in the management of the patient.     Luan Moore, P.A.   ______________________________ Thana Farr, MD    TCJ/MEDQ  D:  08/01/2011  T:  08/01/2011  Job:  [id number]  Electronically Signed by Delice Bison JERNEJCIC P.A. on 08/04/2011 07:03:22 PM Electronically Signed by Thana Farr MD on 08/14/2011 09:48:47 AM

## 2011-08-17 ENCOUNTER — Ambulatory Visit: Payer: Medicare Other | Admitting: Occupational Therapy

## 2011-08-17 ENCOUNTER — Ambulatory Visit: Payer: Medicare Other | Attending: Internal Medicine | Admitting: Physical Therapy

## 2011-08-17 DIAGNOSIS — I69998 Other sequelae following unspecified cerebrovascular disease: Secondary | ICD-10-CM | POA: Insufficient documentation

## 2011-08-17 DIAGNOSIS — M6281 Muscle weakness (generalized): Secondary | ICD-10-CM | POA: Insufficient documentation

## 2011-08-17 DIAGNOSIS — R5381 Other malaise: Secondary | ICD-10-CM | POA: Insufficient documentation

## 2011-08-17 DIAGNOSIS — R269 Unspecified abnormalities of gait and mobility: Secondary | ICD-10-CM | POA: Insufficient documentation

## 2011-08-17 DIAGNOSIS — Z5189 Encounter for other specified aftercare: Secondary | ICD-10-CM | POA: Insufficient documentation

## 2011-08-18 ENCOUNTER — Ambulatory Visit: Payer: Medicare Other | Admitting: Physical Therapy

## 2011-08-18 ENCOUNTER — Ambulatory Visit: Payer: Medicare Other | Admitting: Occupational Therapy

## 2011-08-19 ENCOUNTER — Ambulatory Visit: Payer: Medicare Other | Admitting: Occupational Therapy

## 2011-08-19 ENCOUNTER — Ambulatory Visit: Payer: Medicare Other | Admitting: Physical Therapy

## 2011-08-21 LAB — POCT CARDIAC MARKERS
Myoglobin, poc: 59.5
Operator id: 270651
Troponin i, poc: <0.05

## 2011-08-21 LAB — LIPID PANEL
Cholesterol: 268 — ABNORMAL HIGH
HDL: 58
Triglycerides: 203 — ABNORMAL HIGH

## 2011-08-21 LAB — I-STAT 8, (EC8 V) (CONVERTED LAB)
Bicarbonate: 23.2
HCT: 34 — ABNORMAL LOW
Hemoglobin: 11.6 — ABNORMAL LOW
Operator id: 270651
pCO2, Ven: 34 — ABNORMAL LOW

## 2011-08-21 LAB — CBC
Platelets: 407 — ABNORMAL HIGH
WBC: 13.1 — ABNORMAL HIGH

## 2011-08-21 LAB — HEMOGLOBIN A1C: Mean Plasma Glucose: 115

## 2011-08-21 LAB — DIFFERENTIAL
Lymphocytes Relative: 17
Lymphs Abs: 2.3
Neutrophils Relative %: 77

## 2011-08-21 LAB — TROPONIN I
Troponin I: 0.02
Troponin I: 0.02

## 2011-08-21 LAB — CK TOTAL AND CKMB (NOT AT ARMC): Relative Index: INVALID

## 2011-08-24 ENCOUNTER — Ambulatory Visit: Payer: Medicare Other | Admitting: Physical Therapy

## 2011-08-24 ENCOUNTER — Encounter: Payer: Medicare Other | Admitting: Occupational Therapy

## 2011-08-24 NOTE — Discharge Summary (Signed)
[name], [name] NO.:  [id number]  MEDICAL RECORD NO.:  [id number]  LOCATION:  3003                         FACILITY:  [facility]  PHYSICIAN:  Elizabeth Overlie, MD       DATE OF BIRTH:  [birth year]  DATE OF ADMISSION:  07/31/2011 DATE OF DISCHARGE:  08/03/2011                              DISCHARGE SUMMARY   PRIMARY CARE PHYSICIAN:  Dr. Kevan Ny.  DISCHARGE DIAGNOSES: 1. Hypertension. 2. Dyslipidemia. 3. Left paramedian pons infarct. 4. Deconditioning secondary to cerebrovascular accident, awaiting CIR     consult.  PROCEDURES:  Carotid Doppler ultrasound does not show any significant extracranial carotid artery stenosis.  MRI of the brain shows actual infarct in the left paramedian pons.  No mass effect or hemorrhage. Chronic underlying nonspecific white matter signal changes.  EF of 55- 60% with grade 1 diastolic dysfunction.  No cardiac source of embolic embolism identified.  Hemoglobin A1c of 5.3.  Lipid panel shows triglycerides of 126, LDL of 124, and total cholesterol of 204.  SUBJECTIVE:  This is a 70 year old female who presents to the ER with a chief complaint of atypical coordination and inability to move the right arm and the right leg.  The patient has also suffered a fall on Thursday morning because of difficulty with coordination.  She denied any trauma. The patient symptoms persisted.  She had an earlier episode during the month when her symptoms lasted about 45 minutes.  The patient denied any chest pain, palpitations, or shortness of breath.  HOSPITAL COURSE:  The patient was admitted to the Hospitalist Service for her CVA.  She was found to have multiple risk factors including hypertension and dyslipidemia.  A full stroke workup including a 2-D echo, carotid Doppler, and PT/OT evaluation was done and MRI was consistent with her symptoms.  She was started on a full-dose aspirin. She was asked to discontinue her hormonal therapy and  discontinue Naprosyn.  She was insisted to be started on statin, but the patient states that she has been intolerant to Crestor in the past due to myalgias and therefore she would not to consider starting her statin therapy at this time.  She was requesting to be started on Zetia and Zetia has been started.  The patient was evaluated by PT/OT and was found to be appropriate for CIR.  A CIR consultation is being obtained.  PHYSICAL EXAMINATION:  VITAL SIGNS:  Blood pressure 111/63, respirations 24, pulse 68, temperature 98.0, and 98% on room air. GENERAL:  Comfortable currently.  No acute cardiopulmonary distress. HEENT:  Pupils are equal and reactive.  Extraocular movements are intact. NECK:  Supple.  No JVD. LUNGS:  Clear to auscultation bilaterally.  No wheezes, crackles, or rhonchi. CARDIOVASCULAR:  Regular rate and rhythm.  No murmurs, rubs, or gallops. ABDOMEN:  Soft, nontender, and nondistended. EXTREMITIES:  Without cyanosis, clubbing, or edema. NEUROLOGIC:  Unchanged.  DISCHARGE MEDICATIONS: 1. Aspirin 325 mg p.o. daily. 2. Zetia 10 mg p.o. daily. 3. Azor 10/40 one tablet p.o. daily. 4. Prozac 40 mg p.o. daily. 5. Vitamin B12 1000 mg p.o. daily. 6. Vitamin D 2000 units p.o. daily.     Elizabeth Overlie, MD     NA/MEDQ  D:  08/03/2011  T:  08/03/2011  Job:  [id number]  cc:   Dr. Kevan Ny  Electronically Signed by Elizabeth Overlie MD on 08/24/2011 02:04:57 PM

## 2011-08-25 ENCOUNTER — Ambulatory Visit: Payer: Medicare Other | Admitting: Physical Therapy

## 2011-08-26 ENCOUNTER — Ambulatory Visit: Payer: Medicare Other | Admitting: Physical Therapy

## 2011-08-26 ENCOUNTER — Ambulatory Visit: Payer: Medicare Other | Admitting: Occupational Therapy

## 2011-08-28 ENCOUNTER — Ambulatory Visit: Payer: Medicare Other | Admitting: Physical Therapy

## 2011-08-31 ENCOUNTER — Encounter: Payer: Medicare Other | Admitting: Occupational Therapy

## 2011-08-31 ENCOUNTER — Ambulatory Visit: Payer: Medicare Other | Admitting: Physical Therapy

## 2011-09-02 ENCOUNTER — Encounter: Payer: Medicare Other | Admitting: Occupational Therapy

## 2011-09-02 ENCOUNTER — Ambulatory Visit: Payer: Medicare Other | Admitting: Physical Therapy

## 2011-09-03 ENCOUNTER — Ambulatory Visit: Payer: Medicare Other | Admitting: Physical Therapy

## 2011-09-04 ENCOUNTER — Ambulatory Visit: Payer: Medicare Other | Admitting: Physical Therapy

## 2011-09-04 ENCOUNTER — Encounter: Payer: Medicare Other | Admitting: Occupational Therapy

## 2011-09-07 ENCOUNTER — Ambulatory Visit: Payer: Medicare Other | Admitting: Physical Therapy

## 2011-09-07 ENCOUNTER — Encounter: Payer: Medicare Other | Admitting: Occupational Therapy

## 2011-09-07 ENCOUNTER — Other Ambulatory Visit: Payer: Self-pay | Admitting: Internal Medicine

## 2011-09-07 DIAGNOSIS — Z1231 Encounter for screening mammogram for malignant neoplasm of breast: Secondary | ICD-10-CM

## 2011-09-08 ENCOUNTER — Ambulatory Visit
Admission: RE | Admit: 2011-09-08 | Discharge: 2011-09-08 | Disposition: A | Discharge status: Home / Self Care | Payer: Medicare Other | Source: Ambulatory Visit | Attending: Internal Medicine | Admitting: Internal Medicine

## 2011-09-08 DIAGNOSIS — Z1231 Encounter for screening mammogram for malignant neoplasm of breast: Secondary | ICD-10-CM

## 2011-09-09 ENCOUNTER — Ambulatory Visit: Payer: Medicare Other | Admitting: Physical Therapy

## 2011-09-09 ENCOUNTER — Encounter: Payer: Medicare Other | Admitting: Occupational Therapy

## 2011-09-11 ENCOUNTER — Encounter: Payer: Medicare Other | Admitting: Occupational Therapy

## 2011-09-11 ENCOUNTER — Ambulatory Visit: Payer: Medicare Other | Admitting: Physical Therapy

## 2011-09-14 ENCOUNTER — Encounter: Payer: Medicare Other | Admitting: Occupational Therapy

## 2011-09-14 ENCOUNTER — Ambulatory Visit: Payer: Medicare Other | Admitting: Physical Therapy

## 2011-09-16 ENCOUNTER — Encounter: Payer: Medicare Other | Admitting: Occupational Therapy

## 2011-09-18 ENCOUNTER — Ambulatory Visit: Payer: Medicare Other | Admitting: Physical Therapy

## 2011-09-18 ENCOUNTER — Ambulatory Visit: Payer: Medicare Other | Attending: Internal Medicine | Admitting: Physical Therapy

## 2011-09-18 ENCOUNTER — Encounter: Payer: Medicare Other | Admitting: Occupational Therapy

## 2011-09-18 DIAGNOSIS — R269 Unspecified abnormalities of gait and mobility: Secondary | ICD-10-CM | POA: Insufficient documentation

## 2011-09-18 DIAGNOSIS — Z5189 Encounter for other specified aftercare: Secondary | ICD-10-CM | POA: Insufficient documentation

## 2011-09-18 DIAGNOSIS — I69998 Other sequelae following unspecified cerebrovascular disease: Secondary | ICD-10-CM | POA: Insufficient documentation

## 2011-09-18 DIAGNOSIS — M6281 Muscle weakness (generalized): Secondary | ICD-10-CM | POA: Insufficient documentation

## 2011-09-18 DIAGNOSIS — R5381 Other malaise: Secondary | ICD-10-CM | POA: Insufficient documentation

## 2011-09-23 ENCOUNTER — Ambulatory Visit: Payer: Medicare Other | Admitting: Physical Therapy

## 2011-09-25 ENCOUNTER — Ambulatory Visit: Payer: Medicare Other | Admitting: Physical Therapy

## 2011-10-05 ENCOUNTER — Ambulatory Visit: Payer: Medicare Other | Admitting: Physical Therapy

## 2011-10-06 ENCOUNTER — Ambulatory Visit: Payer: Medicare Other | Admitting: Physical Therapy

## 2011-10-07 ENCOUNTER — Ambulatory Visit: Payer: Medicare Other | Admitting: Physical Therapy

## 2011-10-15 ENCOUNTER — Ambulatory Visit: Payer: Medicare Other | Admitting: Physical Therapy

## 2011-10-20 ENCOUNTER — Ambulatory Visit: Payer: Medicare Other | Attending: Internal Medicine | Admitting: Physical Therapy

## 2011-10-20 DIAGNOSIS — R5381 Other malaise: Secondary | ICD-10-CM | POA: Insufficient documentation

## 2011-10-20 DIAGNOSIS — M6281 Muscle weakness (generalized): Secondary | ICD-10-CM | POA: Insufficient documentation

## 2011-10-20 DIAGNOSIS — R269 Unspecified abnormalities of gait and mobility: Secondary | ICD-10-CM | POA: Insufficient documentation

## 2011-10-20 DIAGNOSIS — Z5189 Encounter for other specified aftercare: Secondary | ICD-10-CM | POA: Insufficient documentation

## 2011-10-20 DIAGNOSIS — I69998 Other sequelae following unspecified cerebrovascular disease: Secondary | ICD-10-CM | POA: Insufficient documentation

## 2011-10-22 ENCOUNTER — Ambulatory Visit: Payer: Medicare Other | Admitting: Physical Therapy

## 2011-11-23 DIAGNOSIS — R209 Unspecified disturbances of skin sensation: Secondary | ICD-10-CM | POA: Diagnosis not present

## 2011-11-23 DIAGNOSIS — I6350 Cerebral infarction due to unspecified occlusion or stenosis of unspecified cerebral artery: Secondary | ICD-10-CM | POA: Diagnosis not present

## 2011-11-23 DIAGNOSIS — M5412 Radiculopathy, cervical region: Secondary | ICD-10-CM | POA: Diagnosis not present

## 2011-11-25 DIAGNOSIS — M5412 Radiculopathy, cervical region: Secondary | ICD-10-CM | POA: Diagnosis not present

## 2011-12-11 DIAGNOSIS — E559 Vitamin D deficiency, unspecified: Secondary | ICD-10-CM | POA: Diagnosis not present

## 2011-12-11 DIAGNOSIS — F329 Major depressive disorder, single episode, unspecified: Secondary | ICD-10-CM | POA: Diagnosis not present

## 2011-12-11 DIAGNOSIS — I10 Essential (primary) hypertension: Secondary | ICD-10-CM | POA: Diagnosis not present

## 2011-12-11 DIAGNOSIS — M5412 Radiculopathy, cervical region: Secondary | ICD-10-CM | POA: Diagnosis not present

## 2011-12-11 DIAGNOSIS — E538 Deficiency of other specified B group vitamins: Secondary | ICD-10-CM | POA: Diagnosis not present

## 2011-12-11 DIAGNOSIS — R5382 Chronic fatigue, unspecified: Secondary | ICD-10-CM | POA: Diagnosis not present

## 2011-12-11 DIAGNOSIS — I6990 Unspecified sequelae of unspecified cerebrovascular disease: Secondary | ICD-10-CM | POA: Diagnosis not present

## 2011-12-29 ENCOUNTER — Ambulatory Visit: Payer: Medicare Other | Attending: Internal Medicine | Admitting: *Deleted

## 2011-12-29 DIAGNOSIS — R5381 Other malaise: Secondary | ICD-10-CM | POA: Diagnosis not present

## 2011-12-29 DIAGNOSIS — R269 Unspecified abnormalities of gait and mobility: Secondary | ICD-10-CM | POA: Diagnosis not present

## 2011-12-29 DIAGNOSIS — I69998 Other sequelae following unspecified cerebrovascular disease: Secondary | ICD-10-CM | POA: Diagnosis not present

## 2011-12-29 DIAGNOSIS — M6281 Muscle weakness (generalized): Secondary | ICD-10-CM | POA: Insufficient documentation

## 2011-12-29 DIAGNOSIS — Z5189 Encounter for other specified aftercare: Secondary | ICD-10-CM | POA: Insufficient documentation

## 2011-12-31 ENCOUNTER — Ambulatory Visit: Payer: Medicare Other | Admitting: *Deleted

## 2012-01-05 ENCOUNTER — Ambulatory Visit: Payer: Medicare Other | Admitting: *Deleted

## 2012-01-07 ENCOUNTER — Ambulatory Visit: Payer: Medicare Other | Admitting: *Deleted

## 2012-01-12 ENCOUNTER — Ambulatory Visit: Payer: Medicare Other | Admitting: *Deleted

## 2012-01-13 DIAGNOSIS — I6350 Cerebral infarction due to unspecified occlusion or stenosis of unspecified cerebral artery: Secondary | ICD-10-CM | POA: Diagnosis not present

## 2012-01-14 ENCOUNTER — Ambulatory Visit: Payer: Medicare Other | Admitting: *Deleted

## 2012-01-19 ENCOUNTER — Ambulatory Visit: Payer: Medicare Other | Admitting: *Deleted

## 2012-01-21 ENCOUNTER — Ambulatory Visit: Payer: Medicare Other | Admitting: *Deleted

## 2012-02-10 DIAGNOSIS — M25519 Pain in unspecified shoulder: Secondary | ICD-10-CM | POA: Diagnosis not present

## 2012-02-10 DIAGNOSIS — E785 Hyperlipidemia, unspecified: Secondary | ICD-10-CM | POA: Diagnosis not present

## 2012-02-10 DIAGNOSIS — R5382 Chronic fatigue, unspecified: Secondary | ICD-10-CM | POA: Diagnosis not present

## 2012-02-10 DIAGNOSIS — I10 Essential (primary) hypertension: Secondary | ICD-10-CM | POA: Diagnosis not present

## 2012-02-10 DIAGNOSIS — E538 Deficiency of other specified B group vitamins: Secondary | ICD-10-CM | POA: Diagnosis not present

## 2012-02-10 DIAGNOSIS — F329 Major depressive disorder, single episode, unspecified: Secondary | ICD-10-CM | POA: Diagnosis not present

## 2012-02-10 DIAGNOSIS — E559 Vitamin D deficiency, unspecified: Secondary | ICD-10-CM | POA: Diagnosis not present

## 2012-02-10 DIAGNOSIS — M5412 Radiculopathy, cervical region: Secondary | ICD-10-CM | POA: Diagnosis not present

## 2012-02-10 DIAGNOSIS — I6990 Unspecified sequelae of unspecified cerebrovascular disease: Secondary | ICD-10-CM | POA: Diagnosis not present

## 2012-04-06 DIAGNOSIS — H3530 Unspecified macular degeneration: Secondary | ICD-10-CM | POA: Diagnosis not present

## 2012-04-06 DIAGNOSIS — H2510 Age-related nuclear cataract, unspecified eye: Secondary | ICD-10-CM | POA: Diagnosis not present

## 2012-04-06 DIAGNOSIS — H02059 Trichiasis without entropian unspecified eye, unspecified eyelid: Secondary | ICD-10-CM | POA: Diagnosis not present

## 2012-04-06 DIAGNOSIS — H5310 Unspecified subjective visual disturbances: Secondary | ICD-10-CM | POA: Diagnosis not present

## 2012-07-05 DIAGNOSIS — I6350 Cerebral infarction due to unspecified occlusion or stenosis of unspecified cerebral artery: Secondary | ICD-10-CM | POA: Diagnosis not present

## 2012-08-17 DIAGNOSIS — Z1331 Encounter for screening for depression: Secondary | ICD-10-CM | POA: Diagnosis not present

## 2012-08-17 DIAGNOSIS — Z79899 Other long term (current) drug therapy: Secondary | ICD-10-CM | POA: Diagnosis not present

## 2012-08-17 DIAGNOSIS — E559 Vitamin D deficiency, unspecified: Secondary | ICD-10-CM | POA: Diagnosis not present

## 2012-08-17 DIAGNOSIS — I6990 Unspecified sequelae of unspecified cerebrovascular disease: Secondary | ICD-10-CM | POA: Diagnosis not present

## 2012-08-17 DIAGNOSIS — E7800 Pure hypercholesterolemia, unspecified: Secondary | ICD-10-CM | POA: Diagnosis not present

## 2012-08-17 DIAGNOSIS — Z0000 Encounter for general adult medical examination without abnormal findings: Secondary | ICD-10-CM | POA: Diagnosis not present

## 2012-08-17 DIAGNOSIS — I10 Essential (primary) hypertension: Secondary | ICD-10-CM | POA: Diagnosis not present

## 2012-08-17 DIAGNOSIS — Z23 Encounter for immunization: Secondary | ICD-10-CM | POA: Diagnosis not present

## 2012-08-17 DIAGNOSIS — E538 Deficiency of other specified B group vitamins: Secondary | ICD-10-CM | POA: Diagnosis not present

## 2012-08-17 DIAGNOSIS — R5382 Chronic fatigue, unspecified: Secondary | ICD-10-CM | POA: Diagnosis not present

## 2012-09-14 ENCOUNTER — Other Ambulatory Visit: Payer: Self-pay | Admitting: Internal Medicine

## 2012-09-14 DIAGNOSIS — Z1231 Encounter for screening mammogram for malignant neoplasm of breast: Secondary | ICD-10-CM

## 2012-09-15 DIAGNOSIS — E538 Deficiency of other specified B group vitamins: Secondary | ICD-10-CM | POA: Diagnosis not present

## 2012-09-15 DIAGNOSIS — E559 Vitamin D deficiency, unspecified: Secondary | ICD-10-CM | POA: Diagnosis not present

## 2012-09-15 DIAGNOSIS — I10 Essential (primary) hypertension: Secondary | ICD-10-CM | POA: Diagnosis not present

## 2012-09-15 DIAGNOSIS — E7800 Pure hypercholesterolemia, unspecified: Secondary | ICD-10-CM | POA: Diagnosis not present

## 2012-09-15 DIAGNOSIS — L659 Nonscarring hair loss, unspecified: Secondary | ICD-10-CM | POA: Diagnosis not present

## 2012-10-18 ENCOUNTER — Ambulatory Visit
Admission: RE | Admit: 2012-10-18 | Discharge: 2012-10-18 | Disposition: A | Discharge status: Home / Self Care | Payer: Medicare Other | Source: Ambulatory Visit | Attending: Internal Medicine | Admitting: Internal Medicine

## 2012-10-18 DIAGNOSIS — Z1231 Encounter for screening mammogram for malignant neoplasm of breast: Secondary | ICD-10-CM

## 2012-11-17 DIAGNOSIS — E538 Deficiency of other specified B group vitamins: Secondary | ICD-10-CM | POA: Diagnosis not present

## 2012-11-17 DIAGNOSIS — I10 Essential (primary) hypertension: Secondary | ICD-10-CM | POA: Diagnosis not present

## 2012-11-17 DIAGNOSIS — E7800 Pure hypercholesterolemia, unspecified: Secondary | ICD-10-CM | POA: Diagnosis not present

## 2012-11-17 DIAGNOSIS — E559 Vitamin D deficiency, unspecified: Secondary | ICD-10-CM | POA: Diagnosis not present

## 2012-11-17 DIAGNOSIS — L659 Nonscarring hair loss, unspecified: Secondary | ICD-10-CM | POA: Diagnosis not present

## 2012-11-17 DIAGNOSIS — M26629 Arthralgia of temporomandibular joint, unspecified side: Secondary | ICD-10-CM | POA: Diagnosis not present

## 2013-04-04 ENCOUNTER — Encounter (HOSPITAL_COMMUNITY): Payer: Self-pay | Admitting: Emergency Medicine

## 2013-04-04 ENCOUNTER — Emergency Department (HOSPITAL_COMMUNITY): Payer: Medicare Other

## 2013-04-04 ENCOUNTER — Observation Stay (HOSPITAL_COMMUNITY)
Admission: EM | Admit: 2013-04-04 | Discharge: 2013-04-04 | Disposition: A | Discharge status: Home / Self Care | Payer: Medicare Other | Attending: Emergency Medicine | Admitting: Emergency Medicine

## 2013-04-04 DIAGNOSIS — Z7982 Long term (current) use of aspirin: Secondary | ICD-10-CM | POA: Diagnosis not present

## 2013-04-04 DIAGNOSIS — Z8673 Personal history of transient ischemic attack (TIA), and cerebral infarction without residual deficits: Secondary | ICD-10-CM | POA: Insufficient documentation

## 2013-04-04 DIAGNOSIS — G459 Transient cerebral ischemic attack, unspecified: Secondary | ICD-10-CM | POA: Diagnosis not present

## 2013-04-04 DIAGNOSIS — R42 Dizziness and giddiness: Secondary | ICD-10-CM | POA: Insufficient documentation

## 2013-04-04 DIAGNOSIS — Z79899 Other long term (current) drug therapy: Secondary | ICD-10-CM | POA: Insufficient documentation

## 2013-04-04 DIAGNOSIS — R110 Nausea: Secondary | ICD-10-CM | POA: Diagnosis not present

## 2013-04-04 DIAGNOSIS — I639 Cerebral infarction, unspecified: Secondary | ICD-10-CM | POA: Insufficient documentation

## 2013-04-04 DIAGNOSIS — I6350 Cerebral infarction due to unspecified occlusion or stenosis of unspecified cerebral artery: Secondary | ICD-10-CM | POA: Diagnosis not present

## 2013-04-04 DIAGNOSIS — G319 Degenerative disease of nervous system, unspecified: Secondary | ICD-10-CM | POA: Diagnosis not present

## 2013-04-04 DIAGNOSIS — R209 Unspecified disturbances of skin sensation: Secondary | ICD-10-CM | POA: Insufficient documentation

## 2013-04-04 DIAGNOSIS — R29898 Other symptoms and signs involving the musculoskeletal system: Secondary | ICD-10-CM | POA: Insufficient documentation

## 2013-04-04 DIAGNOSIS — R29818 Other symptoms and signs involving the nervous system: Secondary | ICD-10-CM | POA: Diagnosis not present

## 2013-04-04 DIAGNOSIS — H538 Other visual disturbances: Secondary | ICD-10-CM | POA: Insufficient documentation

## 2013-04-04 DIAGNOSIS — I10 Essential (primary) hypertension: Secondary | ICD-10-CM | POA: Diagnosis not present

## 2013-04-04 HISTORY — DX: Essential (primary) hypertension: I10

## 2013-04-04 HISTORY — DX: Cerebral infarction, unspecified: I63.9

## 2013-04-04 LAB — APTT: aPTT: 35 seconds (ref 24–37)

## 2013-04-04 LAB — PROTIME-INR: Prothrombin Time: 12.5 seconds (ref 11.6–15.2)

## 2013-04-04 LAB — COMPREHENSIVE METABOLIC PANEL
ALT: 12 U/L (ref 0–35)
AST: 17 U/L (ref 0–37)
CO2: 20 mEq/L (ref 19–32)
Calcium: 9.8 mg/dL (ref 8.4–10.5)
Chloride: 104 mEq/L (ref 96–112)
GFR calc non Af Amer: 65 mL/min — ABNORMAL LOW (ref >90)
Sodium: 139 mEq/L (ref 135–145)
Total Bilirubin: 0.4 mg/dL (ref 0.3–1.2)

## 2013-04-04 LAB — DIFFERENTIAL
Basophils Absolute: 0.1 10*3/uL (ref 0.0–0.1)
Lymphocytes Relative: 17 % (ref 12–46)
Neutro Abs: 6.5 10*3/uL (ref 1.7–7.7)

## 2013-04-04 LAB — CBC
Platelets: 300 10*3/uL (ref 150–400)
RDW: 13.1 % (ref 11.5–15.5)
WBC: 8.4 10*3/uL (ref 4.0–10.5)

## 2013-04-04 LAB — GLUCOSE, CAPILLARY

## 2013-04-04 LAB — POCT I-STAT TROPONIN I: Troponin i, poc: 0 ng/mL (ref 0.00–0.08)

## 2013-04-04 MED ORDER — ONDANSETRON 4 MG PO TBDP: 4.0000 mg | ORAL_TABLET | Freq: Three times a day (TID) | ORAL | Status: DC | PRN

## 2013-04-04 MED ORDER — ONDANSETRON 4 MG PO TBDP
8.0000 mg | ORAL_TABLET | Freq: Once | ORAL | Status: AC
  Administered 2013-04-04: 8 mg via ORAL
  Filled 2013-04-04: qty 2

## 2013-04-04 MED ORDER — LORAZEPAM 2 MG/ML IJ SOLN
1.0000 mg | Freq: Once | INTRAMUSCULAR | Status: AC
  Administered 2013-04-04: 1 mg via INTRAVENOUS
  Filled 2013-04-04: qty 1

## 2013-04-04 MED ORDER — MECLIZINE HCL 25 MG PO TABS
25.0000 mg | ORAL_TABLET | Freq: Once | ORAL | Status: AC
  Administered 2013-04-04: 25 mg via ORAL
  Filled 2013-04-04: qty 1

## 2013-04-04 MED ORDER — MECLIZINE HCL 25 MG PO TABS: ORAL_TABLET | ORAL | Status: DC

## 2013-04-04 NOTE — ED Notes (Signed)
MD at bedside. 

## 2013-04-04 NOTE — ED Notes (Signed)
Pt stated she passes out when she gets her blood drawn and would prefer to be in a bed before blood is drawn on her; RN aware

## 2013-04-04 NOTE — ED Notes (Addendum)
Pt c/o dizziness and right sided numbness upon waking this am; pt sts up early this am and not feeling normal; pt sts hx of CVA in past with similar sx; no obvious neuro deficits; grip strength equal at present

## 2013-04-04 NOTE — ED Provider Notes (Addendum)
History     CSN: [id number]  Arrival date & time 04/04/13  1036   First MD Initiated Contact with Patient 04/04/13 1110      Chief Complaint  Patient presents with  . Stroke Symptoms    (Consider location/radiation/quality/duration/timing/severity/associated sxs/prior treatment) HPI Patient reports she didn't sleep well last night for no apparent reason. She states she did doze off when it was getting day light,  She states when she woke up however she started feeling dizziness which she describes as a spinning sensation especially when she tried to sit up. He did have some nausea without vomiting. She states at home sitting up made it feel worse, laying down made it better. She states the spinning sensation did go away but it did recur when she got to the ER for a short period time. She also reports some tingling in her right hand and her right foot. She and her husband report she has had normal speech today.  Patient reports in September 2012 she had an episode when she was seeing double and had headache with nausea. She did not seek medical care at that time. About 10 days later she started having tingling in her right arm and right leg and her right leg was very weak with difficulty walking on it, she delayed getting medical care and was diagnosed with a stroke. She reports she has regained her strength in her right side. She relates she does have intermittent tingling in her right hand or foot. She did not have a spinning sensation or dizziness with this episode of stroke. She is followed by Dr. Pearlean Brownie, neurology. She states she takes a regular aspirin every other day because taking 1 daily made her gums bleed.  PCP Dr Jerelyn Scott Neurology Dr Pearlean Brownie  Past Medical History  Diagnosis Date  . Hypertension   . CVA (cerebral vascular accident)     History reviewed. No pertinent past surgical history.  History reviewed. No pertinent family history.  History  Substance Use Topics  .  Smoking status: Never Smoker   . Smokeless tobacco: Not on file  . Alcohol Use: No  lives at home Lives with spouse  OB History   Grav Para Term Preterm Abortions TAB SAB Ect Mult Living                  Review of Systems  All other systems reviewed and are negative.    Allergies  Sulfa antibiotics and Penicillins  Home Medications   Current Outpatient Rx  Name  Route  Sig  Dispense  Refill  . amLODipine-olmesartan (AZOR) 10-40 MG per tablet   Oral   Take 1 tablet by mouth daily. Take 0.5/half of this medication daily.         Marland Kitchen aspirin 325 MG tablet   Oral   Take 325 mg by mouth every other day         . Cyanocobalamin (VITAMIN B-12 PO)   Oral   Take 1 tablet by mouth daily.         Marland Kitchen FLUoxetine (PROZAC) 40 MG capsule   Oral   Take 40 mg by mouth daily.         Marland Kitchen omeprazole (PRILOSEC) 20 MG capsule   Oral   Take 20 mg by mouth daily.         Marland Kitchen VITAMIN D, CHOLECALCIFEROL, PO   Oral   Take 1 tablet by mouth daily.  BP 152/74  Pulse 64  Temp(Src) 98.4 F (36.9 C) (Oral)  Resp 18  SpO2 98%  Vital signs normal    Physical Exam  Nursing note and vitals reviewed. Constitutional: She is oriented to person, place, and time. She appears well-developed and well-nourished.  Non-toxic appearance. She does not appear ill. No distress.  HENT:  Head: Normocephalic and atraumatic.  Right Ear: External ear normal.  Left Ear: External ear normal.  Nose: Nose normal. No mucosal edema or rhinorrhea.  Mouth/Throat: Oropharynx is clear and moist and mucous membranes are normal. No dental abscesses or edematous.  Eyes: Conjunctivae and EOM are normal. Pupils are equal, round, and reactive to light.  Neck: Normal range of motion and full passive range of motion without pain. Neck supple.  Cardiovascular: Normal rate, regular rhythm and normal heart sounds.  Exam reveals no gallop and no friction rub.   No murmur heard. Pulmonary/Chest: Effort  normal and breath sounds normal. No respiratory distress. She has no wheezes. She has no rhonchi. She has no rales. She exhibits no tenderness and no crepitus.  Abdominal: Soft. Normal appearance and bowel sounds are normal. She exhibits no distension. There is no tenderness. There is no rebound and no guarding.  Musculoskeletal: Normal range of motion. She exhibits no edema and no tenderness.  Moves all extremities well.   Neurological: She is alert and oriented to person, place, and time. She has normal strength. No cranial nerve deficit.  Cranial nerves II through XII are intact, motor is equal bilaterally, grips are equal bilaterally, finger to nose is equal bilaterally, her motor strength is normal bilaterally. She does report however she has some diminished sensation to light touch on her right lower extremity.  Skin: Skin is warm, dry and intact. No rash noted. No erythema. No pallor.  Psychiatric: She has a normal mood and affect. Her speech is normal and behavior is normal. Her mood appears not anxious.    ED Course  Procedures (including critical care time)   14:06 Dr Thad Ranger, Neurology will see  15:20 Neurology has consulted recommend admission per PA note.   16:25 Dr Lendell Caprice admit to observation, tele, team 2  16:42 Dr Thad Ranger, states patient can be discharged to see Dr Pearlean Brownie as an outpatient, to take her regular ASA today and start her QOD ASA regimine after that.  16:45 Dr Lendell Caprice notified that patient is being discharged.   Results for orders placed during the hospital encounter of 04/04/13  [facility]      Result Value Range   Prothrombin Time 12.5  11.6 - 15.2 seconds   INR 0.94  0.00 - 1.49  APTT      Result Value Range   aPTT 35  24 - 37 seconds  CBC      Result Value Range   WBC 8.4  4.0 - 10.5 K/uL   RBC 4.59  3.87 - 5.11 MIL/uL   Hemoglobin 13.5  12.0 - 15.0 g/dL   HCT 16.1  09.6 - 04.5 %   MCV 84.5  78.0 - 100.0 fL   MCH 29.4  26.0 - 34.0 pg   MCHC  34.8  30.0 - 36.0 g/dL   RDW 40.9  81.1 - 91.4 %   Platelets 300  150 - 400 K/uL  DIFFERENTIAL      Result Value Range   Neutrophils Relative % 77  43 - 77 %   Neutro Abs 6.5  1.7 - 7.7 K/uL   Lymphocytes Relative 17  12 - 46 %   Lymphs Abs 1.5  0.7 - 4.0 K/uL   Monocytes Relative 4  3 - 12 %   Monocytes Absolute 0.3  0.1 - 1.0 K/uL   Eosinophils Relative 1  0 - 5 %   Eosinophils Absolute 0.1  0.0 - 0.7 K/uL   Basophils Relative 1  0 - 1 %   Basophils Absolute 0.1  0.0 - 0.1 K/uL  COMPREHENSIVE METABOLIC PANEL      Result Value Range   Sodium 139  135 - 145 mEq/L   Potassium 4.1  3.5 - 5.1 mEq/L   Chloride 104  96 - 112 mEq/L   CO2 20  19 - 32 mEq/L   Glucose, Bld 102 (*) 70 - 99 mg/dL   BUN 14  6 - 23 mg/dL   Creatinine, Ser 4.09  0.50 - 1.10 mg/dL   Calcium 9.8  8.4 - 81.1 mg/dL   Total Protein 7.1  6.0 - 8.3 g/dL   Albumin 3.7  3.5 - 5.2 g/dL   AST 17  0 - 37 U/L   ALT 12  0 - 35 U/L   Alkaline Phosphatase 93  39 - 117 U/L   Total Bilirubin 0.4  0.3 - 1.2 mg/dL   GFR calc non Af Amer 65 (*) >90 mL/min   GFR calc Af Amer 76 (*) >90 mL/min  TROPONIN I      Result Value Range   Troponin I <0.30  <0.30 ng/mL  GLUCOSE, CAPILLARY      Result Value Range   Glucose-Capillary 92  70 - 99 mg/dL   Comment 1 Documented in Chart     Comment 2 Notify RN    POCT I-STAT TROPONIN I      Result Value Range   Troponin i, poc 0.00  0.00 - 0.08 ng/mL   Comment 3            Laboratory interpretation all normal   Ct Head (brain) Wo Contrast  04/04/2013   *RADIOLOGY REPORT*  Clinical Data: Dizziness and right-sided numbness  CT HEAD WITHOUT CONTRAST  Technique:  Contiguous axial images were obtained from the base of the skull through the vertex without contrast. Study was obtained within 24 hours of patient arrival at the emergency department.  Comparison: Brain MRI July 31, 2011 and brain CT February 07, 2011  Findings: There is mild diffuse atrophy.  There is no mass, hemorrhage,  extra-axial fluid collection, or midline shift.  There is patchy small vessel disease in the centra semiovale bilaterally. There is evidence of small lacunar infarcts in the left pons which do not appear acute.  No acute appearing infarct is seen on this study.  Bony calvarium appears intact.  The mastoid air cells are clear.  IMPRESSION: Atrophy with small vessel disease.  Small prior lacunar infarcts in the left pons.  There is no demonstrable mass, hemorrhage, or acute appearing infarct.   Original Report Authenticated By: Bretta Bang, M.D.   Mr Brain Wo Contrast  04/04/2013   *RADIOLOGY REPORT*  Clinical Data: Dizziness with right-sided weakness.  History of prior brainstem stroke.  Hypertension.  MRI HEAD WITHOUT CONTRAST  Technique:  Multiplanar, multiecho pulse sequences of the brain and surrounding structures were obtained according to standard protocol without intravenous contrast.  Comparison: 04/04/2013 CT.  07/31/2011 MR.  Findings: Motion degraded exam.  No acute infarct.  Remote left pontine infarct.  Moderate small vessel disease type changes.  Global atrophy. Ventricular prominence  without change and may be related to the central atrophy.  Difficult to exclude mild hydrocephalus.  Small area of blood breakdown products right cerebellum unchanged. This may reflect changes of prior hemorrhagic ischemia, trauma or small cavernoma.  No other areas of intracranial hemorrhage.  No other intracranial mass lesion detected on this unenhanced exam.  Major intracranial vascular structures are patent.  Small right vertebral artery without change.  Mild cervical spondylotic changes C3-4.  Cervical medullary junction, pituitary region, pineal region and orbital structures unremarkable.  Minimal paranasal sinus mucosal thickening.  IMPRESSION: No acute infarct.  Please see above for additional findings.   Original Report Authenticated By: Lacy Duverney, M.D.       Date: 04/04/2013  Rate: 71  Rhythm:  normal sinus rhythm  QRS Axis: normal  Intervals: normal  ST/T Wave abnormalities: normal  Conduction Disutrbances:none  Narrative Interpretation:   Old EKG Reviewed: unchanged from 08/01/2011     1. TIA (transient ischemic attack)   2. Vertigo     New Prescriptions   MECLIZINE (ANTIVERT) 25 MG TABLET    Take 1 or 2 po Q 6hrs for dizziness   ONDANSETRON (ZOFRAN ODT) 4 MG DISINTEGRATING TABLET    Take 1 tablet (4 mg total) by mouth every 8 (eight) hours as needed for nausea (or dizziness).     Plan discharge  Devoria Albe, MD, FACEP       MDM          Ward Givens, MD 04/04/13 1625  Ward Givens, MD 04/04/13 1610  Ward Givens, MD 04/04/13 1651

## 2013-04-04 NOTE — Consult Note (Addendum)
Referring Physician: Lynelle Doctor    Chief Complaint: right finger tip paresthesia and resolved right leg paresthesia  HPI:                                                                                                                                         [name] is an 72 y.o. female who suffered a left paramedian pontine infarct in 2012 with right arm/leg paresthesia, diplopia and nausea.  Patient has since had residual right fingertip paresthesia and intermittent right leg paresthesia. Last night she was having no symptoms.  She states she could not sleep and received very little sleep last night.  At 8:30 this am she noted blurred vision, nausea, increased paresthesia of right hand fingertips and right leg paresthesia.  She continues to have finger tip tingling but leg has resolved. While in ER she noted a sensation that the room was "moving back and forth" but this since has resolved.    Patient was on 325 ASA every other day since stroke. Every other day due to significant bleeding in gums when she was on every day.   Date last known well: 5.20.14 Time last known well: early in the morning "just before the son came up" tPA Given: No: out of window  Past Medical History  Diagnosis Date  . Hypertension   . CVA (cerebral vascular accident)     History reviewed. No pertinent past surgical history.  Family history: Both parents died in their 21's.  Mother died of old age.  Father died of an MI.  Social History:  reports that she has never smoked. She does not have any smokeless tobacco history on file. She reports that she does not drink alcohol or use illicit drugs.  Allergies:  Allergies  Allergen Reactions  . Sulfa Antibiotics Nausea And Vomiting  . Penicillins Rash    Medications:                                                                                                                           No current facility-administered medications for this encounter.    Current Outpatient Prescriptions  Medication Sig Dispense Refill  . amLODipine-olmesartan (AZOR) 10-40 MG per tablet Take 1 tablet by mouth daily. Take 0.5/half of this medication daily.      Marland Kitchen aspirin 325 MG tablet Take 325 mg by mouth daily.  Take o      . Cyanocobalamin (VITAMIN B-12 PO) Take 1 tablet by mouth daily.      Marland Kitchen FLUoxetine (PROZAC) 40 MG capsule Take 40 mg by mouth daily.      Marland Kitchen omeprazole (PRILOSEC) 20 MG capsule Take 20 mg by mouth daily.      Marland Kitchen VITAMIN D, CHOLECALCIFEROL, PO Take 1 tablet by mouth daily.      . meclizine (ANTIVERT) 25 MG tablet Take 1 or 2 po Q 6hrs for dizziness  40 tablet  0  . ondansetron (ZOFRAN ODT) 4 MG disintegrating tablet Take 1 tablet (4 mg total) by mouth every 8 (eight) hours as needed for nausea (or dizziness).  8 tablet  0     ROS:                                                                                                                                       History obtained from the patient  General ROS: negative for - chills, fatigue, fever, night sweats, weight gain or weight loss Psychological ROS: negative for - behavioral disorder, hallucinations, memory difficulties, mood swings or suicidal ideation Ophthalmic ROS: negative for - blurry vision, double vision, eye pain or loss of vision ENT ROS: negative for - epistaxis, nasal discharge, oral lesions, sore throat, tinnitus or vertigo Allergy and Immunology ROS: negative for - hives or itchy/watery eyes Hematological and Lymphatic ROS: negative for - bleeding problems, bruising or swollen lymph nodes Endocrine ROS: negative for - galactorrhea, hair pattern changes, polydipsia/polyuria or temperature intolerance Respiratory ROS: negative for - cough, hemoptysis, shortness of breath or wheezing Cardiovascular ROS: negative for - chest pain, dyspnea on exertion, edema or irregular heartbeat Gastrointestinal ROS: negative for - abdominal pain, diarrhea, hematemesis, nausea/vomiting or  stool incontinence Genito-Urinary ROS: negative for - dysuria, hematuria, incontinence or urinary frequency/urgency Musculoskeletal ROS: negative for - joint swelling or muscular weakness Neurological ROS: as noted in HPI Dermatological ROS: negative for rash and skin lesion changes  Neurologic Examination:                                                                                                      Blood pressure 126/77, pulse 75, temperature 98.4 F (36.9 C), temperature source Oral, resp. rate 17, SpO2 94.00%.  Mental Status: Alert, oriented, thought content appropriate.  Speech fluent without evidence of aphasia.  Able to follow 3 step commands without difficulty. Cranial Nerves: II: Discs flat bilaterally; Visual fields grossly normal,  pupils equal, round, reactive to light and accommodation III,IV, VI: ptosis not present, extra-ocular motions intact bilaterally V,VII: smile symmetric, facial light touch sensation normal bilaterally VIII: hearing normal bilaterally IX,X: gag reflex present XI: bilateral shoulder shrug XII: midline tongue extension Motor: Right : Upper extremity   5/5    Left:     Upper extremity   5/5  Lower extremity   5/5     Lower extremity   5/5 Tone and bulk:normal tone throughout; no atrophy noted Sensory: Pinprick and light touch intact throughout, bilaterally---has subjective tingling in right finger tips  Deep Tendon Reflexes: 2+ bilateral UE, right KJ 2+, left KJ 1+ and bilateral AJ 1+ Plantars: Right: downgoing   Left: downgoing Cerebellar: normal finger-to-nose,  normal heel-to-shin test CV: pulses palpable throughout    Results for orders placed during the hospital encounter of 04/04/13 (from the past 48 hour(s))  GLUCOSE, CAPILLARY     Status: None   Collection Time    04/04/13 11:29 AM      Result Value Range   Glucose-Capillary 92  70 - 99 mg/dL   Comment 1 Documented in Chart     Comment 2 Notify RN    PROTIME-INR     Status: None    Collection Time    04/04/13 11:33 AM      Result Value Range   Prothrombin Time 12.5  11.6 - 15.2 seconds   INR 0.94  0.00 - 1.49  APTT     Status: None   Collection Time    04/04/13 11:33 AM      Result Value Range   aPTT 35  24 - 37 seconds  CBC     Status: None   Collection Time    04/04/13 11:33 AM      Result Value Range   WBC 8.4  4.0 - 10.5 K/uL   RBC 4.59  3.87 - 5.11 MIL/uL   Hemoglobin 13.5  12.0 - 15.0 g/dL   HCT 40.9  81.1 - 91.4 %   MCV 84.5  78.0 - 100.0 fL   MCH 29.4  26.0 - 34.0 pg   MCHC 34.8  30.0 - 36.0 g/dL   RDW 78.2  95.6 - 21.3 %   Platelets 300  150 - 400 K/uL  DIFFERENTIAL     Status: None   Collection Time    04/04/13 11:33 AM      Result Value Range   Neutrophils Relative % 77  43 - 77 %   Neutro Abs 6.5  1.7 - 7.7 K/uL   Lymphocytes Relative 17  12 - 46 %   Lymphs Abs 1.5  0.7 - 4.0 K/uL   Monocytes Relative 4  3 - 12 %   Monocytes Absolute 0.3  0.1 - 1.0 K/uL   Eosinophils Relative 1  0 - 5 %   Eosinophils Absolute 0.1  0.0 - 0.7 K/uL   Basophils Relative 1  0 - 1 %   Basophils Absolute 0.1  0.0 - 0.1 K/uL  COMPREHENSIVE METABOLIC PANEL     Status: Abnormal   Collection Time    04/04/13 11:33 AM      Result Value Range   Sodium 139  135 - 145 mEq/L   Potassium 4.1  3.5 - 5.1 mEq/L   Chloride 104  96 - 112 mEq/L   CO2 20  19 - 32 mEq/L   Glucose, Bld 102 (*) 70 - 99 mg/dL   BUN 14  6 -  23 mg/dL   Creatinine, Ser 1.61  0.50 - 1.10 mg/dL   Calcium 9.8  8.4 - 09.6 mg/dL   Total Protein 7.1  6.0 - 8.3 g/dL   Albumin 3.7  3.5 - 5.2 g/dL   AST 17  0 - 37 U/L   ALT 12  0 - 35 U/L   Alkaline Phosphatase 93  39 - 117 U/L   Total Bilirubin 0.4  0.3 - 1.2 mg/dL   GFR calc non Af Amer 65 (*) >90 mL/min   GFR calc Af Amer 76 (*) >90 mL/min   Comment:            The eGFR has been calculated     using the CKD EPI equation.     This calculation has not been     validated in all clinical     situations.     eGFR's persistently     <90  mL/min signify     possible Chronic Kidney Disease.  TROPONIN I     Status: None   Collection Time    04/04/13 11:33 AM      Result Value Range   Troponin I <0.30  <0.30 ng/mL   Comment:            Due to the release kinetics of cTnI,     a negative result within the first hours     of the onset of symptoms does not rule out     myocardial infarction with certainty.     If myocardial infarction is still suspected,     repeat the test at appropriate intervals.  POCT I-STAT TROPONIN I     Status: None   Collection Time    04/04/13 11:46 AM      Result Value Range   Troponin i, poc 0.00  0.00 - 0.08 ng/mL   Comment 3            Comment: Due to the release kinetics of cTnI,     a negative result within the first hours     of the onset of symptoms does not rule out     myocardial infarction with certainty.     If myocardial infarction is still suspected,     repeat the test at appropriate intervals.   Ct Head (brain) Wo Contrast  04/04/2013   *RADIOLOGY REPORT*  Clinical Data: Dizziness and right-sided numbness  CT HEAD WITHOUT CONTRAST  Technique:  Contiguous axial images were obtained from the base of the skull through the vertex without contrast. Study was obtained within 24 hours of patient arrival at the emergency department.  Comparison: Brain MRI July 31, 2011 and brain CT February 07, 2011  Findings: There is mild diffuse atrophy.  There is no mass, hemorrhage, extra-axial fluid collection, or midline shift.  There is patchy small vessel disease in the centra semiovale bilaterally. There is evidence of small lacunar infarcts in the left pons which do not appear acute.  No acute appearing infarct is seen on this study.  Bony calvarium appears intact.  The mastoid air cells are clear.  IMPRESSION: Atrophy with small vessel disease.  Small prior lacunar infarcts in the left pons.  There is no demonstrable mass, hemorrhage, or acute appearing infarct.   Original Report Authenticated By:  Bretta Bang, M.D.   Mr Brain Wo Contrast  04/04/2013   *RADIOLOGY REPORT*  Clinical Data: Dizziness with right-sided weakness.  History of prior brainstem stroke.  Hypertension.  MRI HEAD  WITHOUT CONTRAST  Technique:  Multiplanar, multiecho pulse sequences of the brain and surrounding structures were obtained according to standard protocol without intravenous contrast.  Comparison: 04/04/2013 CT.  07/31/2011 MR.  Findings: Motion degraded exam.  No acute infarct.  Remote left pontine infarct.  Moderate small vessel disease type changes.  Global atrophy. Ventricular prominence without change and may be related to the central atrophy.  Difficult to exclude mild hydrocephalus.  Small area of blood breakdown products right cerebellum unchanged. This may reflect changes of prior hemorrhagic ischemia, trauma or small cavernoma.  No other areas of intracranial hemorrhage.  No other intracranial mass lesion detected on this unenhanced exam.  Major intracranial vascular structures are patent.  Small right vertebral artery without change.  Mild cervical spondylotic changes C3-4.  Cervical medullary junction, pituitary region, pineal region and orbital structures unremarkable.  Minimal paranasal sinus mucosal thickening.  IMPRESSION: No acute infarct.  Please see above for additional findings.   Original Report Authenticated By: Lacy Duverney, M.D.    Assessment and plan discussed with with attending physician and they are in agreement.    Felicie Morn PA-C [facility] [phone]  04/04/2013, 5:05 PM   Patient seen and examined.  Clinical course and management discussed.  Necessary edits performed.  I agree with the above.  Assessment and plan of care developed and discussed below.    Assessment: 72 y.o. female presenting with complaints of right hand and foot tingling.  Symptoms in foot have improved.  Patient reports to having intermittent symptoms in this distribution since her initial infarct  but this sensation was worse.  Head CT has been reviewed and shows no acute changes.  MRI of the brain has also been reviewed and shows no evidence of acute changes as well.  With no evidence of acute stroke do not feel that further stroke work up is indicated at this time.  Patient on an aspirin every other day at home.   Patient is not a tPA candidate, presented outside time window.    Stroke Risk Factors - hypertension and CVA  Recommendations: 1.  ASA 325 mg today.  To start back on her usual regimen tomorrow 2.  Patient to call Dr. Marlis Edelson office in AM for follow up instructions.   Case discussed with Dr. Cristal Generous, MD [facility] [phone]  04/04/2013  5:08 PM

## 2013-04-04 NOTE — ED Notes (Signed)
Dr. Reynolds at the bedside.  

## 2013-04-11 DIAGNOSIS — H52209 Unspecified astigmatism, unspecified eye: Secondary | ICD-10-CM | POA: Diagnosis not present

## 2013-04-11 DIAGNOSIS — H2510 Age-related nuclear cataract, unspecified eye: Secondary | ICD-10-CM | POA: Diagnosis not present

## 2013-04-11 DIAGNOSIS — H3530 Unspecified macular degeneration: Secondary | ICD-10-CM | POA: Diagnosis not present

## 2013-04-12 DIAGNOSIS — H6980 Other specified disorders of Eustachian tube, unspecified ear: Secondary | ICD-10-CM | POA: Diagnosis not present

## 2013-04-12 DIAGNOSIS — M25519 Pain in unspecified shoulder: Secondary | ICD-10-CM | POA: Diagnosis not present

## 2013-04-12 DIAGNOSIS — G501 Atypical facial pain: Secondary | ICD-10-CM | POA: Diagnosis not present

## 2013-04-12 DIAGNOSIS — H8110 Benign paroxysmal vertigo, unspecified ear: Secondary | ICD-10-CM | POA: Diagnosis not present

## 2013-04-26 DIAGNOSIS — L0291 Cutaneous abscess, unspecified: Secondary | ICD-10-CM | POA: Diagnosis not present

## 2013-04-26 DIAGNOSIS — L0390 Cellulitis, unspecified: Secondary | ICD-10-CM | POA: Diagnosis not present

## 2013-04-27 ENCOUNTER — Ambulatory Visit: Payer: Medicare Other | Admitting: Rehabilitation

## 2013-04-28 DIAGNOSIS — L408 Other psoriasis: Secondary | ICD-10-CM | POA: Diagnosis not present

## 2013-04-28 DIAGNOSIS — L03119 Cellulitis of unspecified part of limb: Secondary | ICD-10-CM | POA: Diagnosis not present

## 2013-04-28 DIAGNOSIS — L02419 Cutaneous abscess of limb, unspecified: Secondary | ICD-10-CM | POA: Diagnosis not present

## 2013-05-03 DIAGNOSIS — A4902 Methicillin resistant Staphylococcus aureus infection, unspecified site: Secondary | ICD-10-CM | POA: Diagnosis not present

## 2013-05-03 DIAGNOSIS — L03119 Cellulitis of unspecified part of limb: Secondary | ICD-10-CM | POA: Diagnosis not present

## 2013-05-23 DIAGNOSIS — E538 Deficiency of other specified B group vitamins: Secondary | ICD-10-CM | POA: Diagnosis not present

## 2013-05-23 DIAGNOSIS — M26629 Arthralgia of temporomandibular joint, unspecified side: Secondary | ICD-10-CM | POA: Diagnosis not present

## 2013-05-23 DIAGNOSIS — H6980 Other specified disorders of Eustachian tube, unspecified ear: Secondary | ICD-10-CM | POA: Diagnosis not present

## 2013-05-23 DIAGNOSIS — I10 Essential (primary) hypertension: Secondary | ICD-10-CM | POA: Diagnosis not present

## 2013-05-23 DIAGNOSIS — L03119 Cellulitis of unspecified part of limb: Secondary | ICD-10-CM | POA: Diagnosis not present

## 2013-05-23 DIAGNOSIS — A4902 Methicillin resistant Staphylococcus aureus infection, unspecified site: Secondary | ICD-10-CM | POA: Diagnosis not present

## 2013-05-23 DIAGNOSIS — L02419 Cutaneous abscess of limb, unspecified: Secondary | ICD-10-CM | POA: Diagnosis not present

## 2013-05-23 DIAGNOSIS — M25519 Pain in unspecified shoulder: Secondary | ICD-10-CM | POA: Diagnosis not present

## 2013-05-23 DIAGNOSIS — H8110 Benign paroxysmal vertigo, unspecified ear: Secondary | ICD-10-CM | POA: Diagnosis not present

## 2013-05-31 ENCOUNTER — Other Ambulatory Visit: Payer: Self-pay | Admitting: Neurology

## 2013-10-10 DIAGNOSIS — Z23 Encounter for immunization: Secondary | ICD-10-CM | POA: Diagnosis not present

## 2013-11-08 ENCOUNTER — Other Ambulatory Visit: Payer: Self-pay | Admitting: Internal Medicine

## 2013-11-08 DIAGNOSIS — E559 Vitamin D deficiency, unspecified: Secondary | ICD-10-CM | POA: Diagnosis not present

## 2013-11-08 DIAGNOSIS — M542 Cervicalgia: Secondary | ICD-10-CM

## 2013-11-08 DIAGNOSIS — Z1331 Encounter for screening for depression: Secondary | ICD-10-CM | POA: Diagnosis not present

## 2013-11-08 DIAGNOSIS — E7800 Pure hypercholesterolemia, unspecified: Secondary | ICD-10-CM | POA: Diagnosis not present

## 2013-11-08 DIAGNOSIS — E739 Lactose intolerance, unspecified: Secondary | ICD-10-CM | POA: Diagnosis not present

## 2013-11-08 DIAGNOSIS — R220 Localized swelling, mass and lump, head: Secondary | ICD-10-CM | POA: Diagnosis not present

## 2013-11-08 DIAGNOSIS — Z0000 Encounter for general adult medical examination without abnormal findings: Secondary | ICD-10-CM | POA: Diagnosis not present

## 2013-11-08 DIAGNOSIS — E538 Deficiency of other specified B group vitamins: Secondary | ICD-10-CM | POA: Diagnosis not present

## 2013-11-08 DIAGNOSIS — I10 Essential (primary) hypertension: Secondary | ICD-10-CM | POA: Diagnosis not present

## 2013-11-10 ENCOUNTER — Other Ambulatory Visit: Payer: Medicare Other

## 2013-11-14 ENCOUNTER — Ambulatory Visit
Admission: RE | Admit: 2013-11-14 | Discharge: 2013-11-14 | Disposition: A | Discharge status: Home / Self Care | Payer: Medicare Other | Source: Ambulatory Visit | Attending: Internal Medicine | Admitting: Internal Medicine

## 2013-11-14 DIAGNOSIS — M542 Cervicalgia: Secondary | ICD-10-CM

## 2013-11-14 DIAGNOSIS — R220 Localized swelling, mass and lump, head: Secondary | ICD-10-CM

## 2013-12-06 DIAGNOSIS — R220 Localized swelling, mass and lump, head: Secondary | ICD-10-CM | POA: Diagnosis not present

## 2013-12-06 DIAGNOSIS — R6889 Other general symptoms and signs: Secondary | ICD-10-CM | POA: Diagnosis not present

## 2013-12-06 DIAGNOSIS — R221 Localized swelling, mass and lump, neck: Secondary | ICD-10-CM | POA: Diagnosis not present

## 2014-04-17 DIAGNOSIS — H3530 Unspecified macular degeneration: Secondary | ICD-10-CM | POA: Diagnosis not present

## 2014-04-17 DIAGNOSIS — H2510 Age-related nuclear cataract, unspecified eye: Secondary | ICD-10-CM | POA: Diagnosis not present

## 2014-04-17 DIAGNOSIS — H52209 Unspecified astigmatism, unspecified eye: Secondary | ICD-10-CM | POA: Diagnosis not present

## 2014-05-11 DIAGNOSIS — L819 Disorder of pigmentation, unspecified: Secondary | ICD-10-CM | POA: Diagnosis not present

## 2014-05-11 DIAGNOSIS — R6889 Other general symptoms and signs: Secondary | ICD-10-CM | POA: Diagnosis not present

## 2014-05-11 DIAGNOSIS — E739 Lactose intolerance, unspecified: Secondary | ICD-10-CM | POA: Diagnosis not present

## 2014-05-11 DIAGNOSIS — I10 Essential (primary) hypertension: Secondary | ICD-10-CM | POA: Diagnosis not present

## 2014-05-11 DIAGNOSIS — L570 Actinic keratosis: Secondary | ICD-10-CM | POA: Diagnosis not present

## 2014-05-11 DIAGNOSIS — E7800 Pure hypercholesterolemia, unspecified: Secondary | ICD-10-CM | POA: Diagnosis not present

## 2014-05-11 DIAGNOSIS — M25519 Pain in unspecified shoulder: Secondary | ICD-10-CM | POA: Diagnosis not present

## 2014-05-11 DIAGNOSIS — L821 Other seborrheic keratosis: Secondary | ICD-10-CM | POA: Diagnosis not present

## 2014-05-11 DIAGNOSIS — E538 Deficiency of other specified B group vitamins: Secondary | ICD-10-CM | POA: Diagnosis not present

## 2014-05-11 DIAGNOSIS — L723 Sebaceous cyst: Secondary | ICD-10-CM | POA: Diagnosis not present

## 2014-05-11 DIAGNOSIS — D1801 Hemangioma of skin and subcutaneous tissue: Secondary | ICD-10-CM | POA: Diagnosis not present

## 2014-05-11 DIAGNOSIS — E559 Vitamin D deficiency, unspecified: Secondary | ICD-10-CM | POA: Diagnosis not present

## 2014-05-11 DIAGNOSIS — B079 Viral wart, unspecified: Secondary | ICD-10-CM | POA: Diagnosis not present

## 2014-08-09 ENCOUNTER — Other Ambulatory Visit: Payer: Self-pay

## 2014-08-09 ENCOUNTER — Ambulatory Visit
Admission: RE | Admit: 2014-08-09 | Discharge: 2014-08-09 | Disposition: A | Discharge status: Home / Self Care | Payer: Medicare Other | Source: Ambulatory Visit

## 2014-08-09 DIAGNOSIS — Z1231 Encounter for screening mammogram for malignant neoplasm of breast: Secondary | ICD-10-CM | POA: Diagnosis not present

## 2014-10-22 DIAGNOSIS — Z23 Encounter for immunization: Secondary | ICD-10-CM | POA: Diagnosis not present

## 2014-11-15 DIAGNOSIS — F419 Anxiety disorder, unspecified: Secondary | ICD-10-CM | POA: Diagnosis not present

## 2014-11-15 DIAGNOSIS — J329 Chronic sinusitis, unspecified: Secondary | ICD-10-CM | POA: Diagnosis not present

## 2014-11-15 DIAGNOSIS — Z23 Encounter for immunization: Secondary | ICD-10-CM | POA: Diagnosis not present

## 2014-11-15 DIAGNOSIS — Z0001 Encounter for general adult medical examination with abnormal findings: Secondary | ICD-10-CM | POA: Diagnosis not present

## 2014-11-15 DIAGNOSIS — D81818 Other biotin-dependent carboxylase deficiency: Secondary | ICD-10-CM | POA: Diagnosis not present

## 2014-11-15 DIAGNOSIS — R413 Other amnesia: Secondary | ICD-10-CM | POA: Diagnosis not present

## 2014-11-15 DIAGNOSIS — E780 Pure hypercholesterolemia: Secondary | ICD-10-CM | POA: Diagnosis not present

## 2014-11-15 DIAGNOSIS — I10 Essential (primary) hypertension: Secondary | ICD-10-CM | POA: Diagnosis not present

## 2014-11-15 DIAGNOSIS — Z1389 Encounter for screening for other disorder: Secondary | ICD-10-CM | POA: Diagnosis not present

## 2014-11-15 DIAGNOSIS — E559 Vitamin D deficiency, unspecified: Secondary | ICD-10-CM | POA: Diagnosis not present

## 2015-02-05 DIAGNOSIS — L570 Actinic keratosis: Secondary | ICD-10-CM | POA: Diagnosis not present

## 2015-02-05 DIAGNOSIS — L309 Dermatitis, unspecified: Secondary | ICD-10-CM | POA: Diagnosis not present

## 2015-04-23 DIAGNOSIS — H52203 Unspecified astigmatism, bilateral: Secondary | ICD-10-CM | POA: Diagnosis not present

## 2015-04-23 DIAGNOSIS — H2513 Age-related nuclear cataract, bilateral: Secondary | ICD-10-CM | POA: Diagnosis not present

## 2015-04-23 DIAGNOSIS — H3531 Nonexudative age-related macular degeneration: Secondary | ICD-10-CM | POA: Diagnosis not present

## 2015-05-07 ENCOUNTER — Other Ambulatory Visit (HOSPITAL_COMMUNITY): Payer: Self-pay | Admitting: Internal Medicine

## 2015-05-07 DIAGNOSIS — R1011 Right upper quadrant pain: Secondary | ICD-10-CM

## 2015-05-07 DIAGNOSIS — M546 Pain in thoracic spine: Secondary | ICD-10-CM | POA: Diagnosis not present

## 2015-05-07 DIAGNOSIS — M791 Myalgia: Secondary | ICD-10-CM | POA: Diagnosis not present

## 2015-05-07 DIAGNOSIS — M25511 Pain in right shoulder: Secondary | ICD-10-CM | POA: Diagnosis not present

## 2015-05-08 ENCOUNTER — Ambulatory Visit (HOSPITAL_COMMUNITY)
Admission: RE | Admit: 2015-05-08 | Discharge: 2015-05-08 | Disposition: A | Discharge status: Home / Self Care | Payer: Medicare Other | Source: Ambulatory Visit | Attending: Internal Medicine | Admitting: Internal Medicine

## 2015-05-08 DIAGNOSIS — R1011 Right upper quadrant pain: Secondary | ICD-10-CM | POA: Diagnosis not present

## 2015-05-08 MED ORDER — TECHNETIUM TC 99M MEBROFENIN IV KIT
5.0000 | PACK | Freq: Once | INTRAVENOUS | Status: AC | PRN
  Administered 2015-05-08: 5 via INTRAVENOUS

## 2015-05-08 MED ORDER — STERILE WATER FOR INJECTION IJ SOLN
INTRAMUSCULAR | Status: AC
  Filled 2015-05-08: qty 10

## 2015-05-08 MED ORDER — SINCALIDE 5 MCG IJ SOLR: 0.0200 ug/kg | Freq: Once | INTRAMUSCULAR | Status: AC

## 2015-05-08 MED ORDER — SINCALIDE 5 MCG IJ SOLR
INTRAMUSCULAR | Status: AC
  Administered 2015-05-08: 1.5 ug via INTRAVENOUS
  Filled 2015-05-08: qty 5

## 2015-05-17 ENCOUNTER — Ambulatory Visit (HOSPITAL_COMMUNITY): Payer: Medicare Other

## 2015-06-04 ENCOUNTER — Other Ambulatory Visit: Payer: Self-pay | Admitting: Internal Medicine

## 2015-06-04 DIAGNOSIS — M546 Pain in thoracic spine: Secondary | ICD-10-CM | POA: Diagnosis not present

## 2015-06-04 DIAGNOSIS — M791 Myalgia: Secondary | ICD-10-CM | POA: Diagnosis not present

## 2015-06-04 DIAGNOSIS — R1011 Right upper quadrant pain: Secondary | ICD-10-CM | POA: Diagnosis not present

## 2015-06-04 DIAGNOSIS — R413 Other amnesia: Secondary | ICD-10-CM | POA: Diagnosis not present

## 2015-06-04 DIAGNOSIS — E559 Vitamin D deficiency, unspecified: Secondary | ICD-10-CM | POA: Diagnosis not present

## 2015-06-04 DIAGNOSIS — D81818 Other biotin-dependent carboxylase deficiency: Secondary | ICD-10-CM | POA: Diagnosis not present

## 2015-06-04 DIAGNOSIS — K219 Gastro-esophageal reflux disease without esophagitis: Secondary | ICD-10-CM | POA: Diagnosis not present

## 2015-06-04 DIAGNOSIS — F329 Major depressive disorder, single episode, unspecified: Secondary | ICD-10-CM | POA: Diagnosis not present

## 2015-06-04 DIAGNOSIS — I10 Essential (primary) hypertension: Secondary | ICD-10-CM | POA: Diagnosis not present

## 2015-06-10 ENCOUNTER — Other Ambulatory Visit: Payer: Self-pay | Admitting: General Surgery

## 2015-06-10 DIAGNOSIS — Z8673 Personal history of transient ischemic attack (TIA), and cerebral infarction without residual deficits: Secondary | ICD-10-CM | POA: Diagnosis not present

## 2015-06-10 DIAGNOSIS — M549 Dorsalgia, unspecified: Secondary | ICD-10-CM

## 2015-06-10 DIAGNOSIS — E785 Hyperlipidemia, unspecified: Secondary | ICD-10-CM | POA: Diagnosis not present

## 2015-06-10 DIAGNOSIS — M5489 Other dorsalgia: Secondary | ICD-10-CM

## 2015-06-10 DIAGNOSIS — Z9889 Other specified postprocedural states: Secondary | ICD-10-CM | POA: Diagnosis not present

## 2015-06-10 DIAGNOSIS — Z8719 Personal history of other diseases of the digestive system: Secondary | ICD-10-CM

## 2015-06-10 DIAGNOSIS — F419 Anxiety disorder, unspecified: Secondary | ICD-10-CM | POA: Diagnosis not present

## 2015-06-10 DIAGNOSIS — K219 Gastro-esophageal reflux disease without esophagitis: Secondary | ICD-10-CM | POA: Diagnosis not present

## 2015-06-10 DIAGNOSIS — F329 Major depressive disorder, single episode, unspecified: Secondary | ICD-10-CM | POA: Diagnosis not present

## 2015-06-10 DIAGNOSIS — I10 Essential (primary) hypertension: Secondary | ICD-10-CM | POA: Diagnosis not present

## 2015-06-12 ENCOUNTER — Ambulatory Visit
Admission: RE | Admit: 2015-06-12 | Discharge: 2015-06-12 | Disposition: A | Discharge status: Home / Self Care | Payer: Medicare Other | Source: Ambulatory Visit | Attending: Internal Medicine | Admitting: Internal Medicine

## 2015-06-12 DIAGNOSIS — R197 Diarrhea, unspecified: Secondary | ICD-10-CM | POA: Diagnosis not present

## 2015-06-12 DIAGNOSIS — K8020 Calculus of gallbladder without cholecystitis without obstruction: Secondary | ICD-10-CM | POA: Diagnosis not present

## 2015-06-12 DIAGNOSIS — R1011 Right upper quadrant pain: Secondary | ICD-10-CM

## 2015-06-19 ENCOUNTER — Other Ambulatory Visit: Payer: Self-pay | Admitting: General Surgery

## 2015-06-20 ENCOUNTER — Other Ambulatory Visit: Payer: Self-pay | Admitting: General Surgery

## 2015-06-20 DIAGNOSIS — K449 Diaphragmatic hernia without obstruction or gangrene: Secondary | ICD-10-CM

## 2015-06-20 NOTE — Addendum Note (Signed)
Addended by: [name] on: 06/20/2015 12:24 PM   Modules accepted: Orders

## 2015-06-26 ENCOUNTER — Ambulatory Visit
Admission: RE | Admit: 2015-06-26 | Discharge: 2015-06-26 | Disposition: A | Discharge status: Home / Self Care | Payer: Medicare Other | Source: Ambulatory Visit | Attending: General Surgery | Admitting: General Surgery

## 2015-06-26 DIAGNOSIS — K219 Gastro-esophageal reflux disease without esophagitis: Secondary | ICD-10-CM | POA: Diagnosis not present

## 2015-06-26 DIAGNOSIS — K449 Diaphragmatic hernia without obstruction or gangrene: Secondary | ICD-10-CM | POA: Diagnosis not present

## 2015-07-16 ENCOUNTER — Other Ambulatory Visit: Payer: Self-pay | Admitting: General Surgery

## 2015-07-16 DIAGNOSIS — F329 Major depressive disorder, single episode, unspecified: Secondary | ICD-10-CM | POA: Diagnosis not present

## 2015-07-16 DIAGNOSIS — I10 Essential (primary) hypertension: Secondary | ICD-10-CM | POA: Diagnosis not present

## 2015-07-16 DIAGNOSIS — Z9889 Other specified postprocedural states: Secondary | ICD-10-CM | POA: Diagnosis not present

## 2015-07-16 DIAGNOSIS — Z8673 Personal history of transient ischemic attack (TIA), and cerebral infarction without residual deficits: Secondary | ICD-10-CM | POA: Diagnosis not present

## 2015-07-16 DIAGNOSIS — K8020 Calculus of gallbladder without cholecystitis without obstruction: Secondary | ICD-10-CM | POA: Diagnosis not present

## 2015-07-16 DIAGNOSIS — K219 Gastro-esophageal reflux disease without esophagitis: Secondary | ICD-10-CM | POA: Diagnosis not present

## 2015-07-16 DIAGNOSIS — M549 Dorsalgia, unspecified: Secondary | ICD-10-CM | POA: Diagnosis not present

## 2015-07-16 DIAGNOSIS — E785 Hyperlipidemia, unspecified: Secondary | ICD-10-CM | POA: Diagnosis not present

## 2015-07-16 DIAGNOSIS — F419 Anxiety disorder, unspecified: Secondary | ICD-10-CM | POA: Diagnosis not present

## 2015-08-08 DIAGNOSIS — L570 Actinic keratosis: Secondary | ICD-10-CM | POA: Diagnosis not present

## 2015-08-08 DIAGNOSIS — D225 Melanocytic nevi of trunk: Secondary | ICD-10-CM | POA: Diagnosis not present

## 2015-08-08 DIAGNOSIS — D2262 Melanocytic nevi of left upper limb, including shoulder: Secondary | ICD-10-CM | POA: Diagnosis not present

## 2015-08-08 DIAGNOSIS — L814 Other melanin hyperpigmentation: Secondary | ICD-10-CM | POA: Diagnosis not present

## 2015-08-08 DIAGNOSIS — L821 Other seborrheic keratosis: Secondary | ICD-10-CM | POA: Diagnosis not present

## 2015-08-08 DIAGNOSIS — L918 Other hypertrophic disorders of the skin: Secondary | ICD-10-CM | POA: Diagnosis not present

## 2015-08-19 ENCOUNTER — Other Ambulatory Visit: Payer: Self-pay

## 2015-08-19 DIAGNOSIS — Z1231 Encounter for screening mammogram for malignant neoplasm of breast: Secondary | ICD-10-CM

## 2015-08-20 ENCOUNTER — Ambulatory Visit
Admission: RE | Admit: 2015-08-20 | Discharge: 2015-08-20 | Disposition: A | Discharge status: Home / Self Care | Payer: Medicare Other | Source: Ambulatory Visit

## 2015-08-20 DIAGNOSIS — Z1231 Encounter for screening mammogram for malignant neoplasm of breast: Secondary | ICD-10-CM | POA: Diagnosis not present

## 2015-08-22 ENCOUNTER — Other Ambulatory Visit: Payer: Self-pay | Admitting: Internal Medicine

## 2015-08-22 DIAGNOSIS — R928 Other abnormal and inconclusive findings on diagnostic imaging of breast: Secondary | ICD-10-CM

## 2015-08-26 ENCOUNTER — Ambulatory Visit
Admission: RE | Admit: 2015-08-26 | Discharge: 2015-08-26 | Disposition: A | Discharge status: Home / Self Care | Payer: Medicare Other | Source: Ambulatory Visit | Attending: Internal Medicine | Admitting: Internal Medicine

## 2015-08-26 DIAGNOSIS — R928 Other abnormal and inconclusive findings on diagnostic imaging of breast: Secondary | ICD-10-CM | POA: Diagnosis not present

## 2015-08-26 DIAGNOSIS — N6489 Other specified disorders of breast: Secondary | ICD-10-CM | POA: Diagnosis not present

## 2015-08-26 NOTE — Pre-Procedure Instructions (Signed)
Shawnelle Spoerl [facility]  08/26/2015      [id number] [address] [place] [address] [address] Phone: [phone] Fax: [phone]    Your procedure is scheduled on Wed, Oct 12 @ 10:30 AM  Report to [facility] Admitting at 8:30 AM  Call this number if you have problems the morning of surgery:  [phone]   Remember:  Do not eat food or drink liquids after midnight.  Take these medicines the morning of surgery with A SIP OF WATER Amlodipine-Olmesartan(Azor),Prozac(Fluoxetine), and Meclizine(Antivert-if needed)               Stop taking your Aspirin. No Goody's,BC's,Aleve,Ibuprofen,Fish Oil,or any Herbal Medications.    Do not wear jewelry, make-up or nail polish.  Do not wear lotions, powders, or perfumes.  You may wear deodorant.  Do not shave 48 hours prior to surgery.    Do not bring valuables to the hospital.  [facility] is not responsible for any belongings or valuables.  Contacts, dentures or bridgework may not be worn into surgery.  Leave your suitcase in the car.  After surgery it may be brought to your room.  For patients admitted to the hospital, discharge time will be determined by your treatment team.  Patients discharged the day of surgery will not be allowed to drive home.    Special instructions:   - Preparing for Surgery  Before surgery, you can play an important role.  Because skin is not sterile, your skin needs to be as free of germs as possible.  You can reduce the number of germs on you skin by washing with CHG (chlorahexidine gluconate) soap before surgery.  CHG is an antiseptic cleaner which kills germs and bonds with the skin to continue killing germs even after washing.  Please DO NOT use if you have an allergy to CHG or antibacterial soaps.  If your skin becomes reddened/irritated stop using the CHG and inform your nurse when you arrive at Short Stay.  Do not shave  (including legs and underarms) for at least 48 hours prior to the first CHG shower.  You may shave your face.  Please follow these instructions carefully:   1.  Shower with CHG Soap the night before surgery and the                                morning of Surgery.  2.  If you choose to wash your hair, wash your hair first as usual with your       normal shampoo.  3.  After you shampoo, rinse your hair and body thoroughly to remove the                      Shampoo.  4.  Use CHG as you would any other liquid soap.  You can apply chg directly       to the skin and wash gently with scrungie or a clean washcloth.  5.  Apply the CHG Soap to your body ONLY FROM THE NECK DOWN.        Do not use on open wounds or open sores.  Avoid contact with your eyes,       ears, mouth and genitals (private parts).  Wash genitals (private parts)       with your normal soap.  6.  Wash thoroughly,  paying special attention to the area where your surgery        will be performed.  7.  Thoroughly rinse your body with warm water from the neck down.  8.  DO NOT shower/wash with your normal soap after using and rinsing off       the CHG Soap.  9.  Pat yourself dry with a clean towel.            10.  Wear clean pajamas.            11.  Place clean sheets on your bed the night of your first shower and do not        sleep with pets.  Day of Surgery  Do not apply any lotions/deoderants the morning of surgery.  Please wear clean clothes to the hospital/surgery center.    Please read over the following fact sheets that you were given. Pain Booklet, Coughing and Deep Breathing and Surgical Site Infection Prevention

## 2015-08-27 ENCOUNTER — Encounter (HOSPITAL_COMMUNITY): Payer: Self-pay

## 2015-08-27 ENCOUNTER — Other Ambulatory Visit: Payer: Self-pay

## 2015-08-27 ENCOUNTER — Encounter (HOSPITAL_COMMUNITY)
Admission: RE | Admit: 2015-08-27 | Discharge: 2015-08-27 | Disposition: A | Discharge status: Home / Self Care | Payer: Medicare Other | Source: Ambulatory Visit | Attending: General Surgery | Admitting: General Surgery

## 2015-08-27 DIAGNOSIS — I10 Essential (primary) hypertension: Secondary | ICD-10-CM | POA: Diagnosis not present

## 2015-08-27 DIAGNOSIS — Z7982 Long term (current) use of aspirin: Secondary | ICD-10-CM | POA: Diagnosis not present

## 2015-08-27 DIAGNOSIS — K8010 Calculus of gallbladder with chronic cholecystitis without obstruction: Secondary | ICD-10-CM | POA: Diagnosis not present

## 2015-08-27 DIAGNOSIS — E785 Hyperlipidemia, unspecified: Secondary | ICD-10-CM | POA: Diagnosis not present

## 2015-08-27 DIAGNOSIS — F329 Major depressive disorder, single episode, unspecified: Secondary | ICD-10-CM | POA: Diagnosis not present

## 2015-08-27 DIAGNOSIS — Z8673 Personal history of transient ischemic attack (TIA), and cerebral infarction without residual deficits: Secondary | ICD-10-CM | POA: Diagnosis not present

## 2015-08-27 DIAGNOSIS — K219 Gastro-esophageal reflux disease without esophagitis: Secondary | ICD-10-CM | POA: Diagnosis not present

## 2015-08-27 DIAGNOSIS — F419 Anxiety disorder, unspecified: Secondary | ICD-10-CM | POA: Diagnosis not present

## 2015-08-27 HISTORY — DX: Major depressive disorder, single episode, unspecified: F32.9

## 2015-08-27 HISTORY — DX: Unspecified osteoarthritis, unspecified site: M19.90

## 2015-08-27 HISTORY — DX: Gastro-esophageal reflux disease without esophagitis: K21.9

## 2015-08-27 HISTORY — DX: Depression, unspecified: F32.A

## 2015-08-27 LAB — CBC WITH DIFFERENTIAL/PLATELET
BASOS ABS: 0 10*3/uL (ref 0.0–0.1)
Basophils Relative: 0 %
Eosinophils Absolute: 0.2 10*3/uL (ref 0.0–0.7)
Eosinophils Relative: 3 %
HEMATOCRIT: 40.9 % (ref 36.0–46.0)
Hemoglobin: 13.8 g/dL (ref 12.0–15.0)
LYMPHS ABS: 1.9 10*3/uL (ref 0.7–4.0)
LYMPHS PCT: 26 %
MCH: 28.9 pg (ref 26.0–34.0)
MCHC: 33.7 g/dL (ref 30.0–36.0)
MCV: 85.7 fL (ref 78.0–100.0)
Monocytes Absolute: 0.4 10*3/uL (ref 0.1–1.0)
Monocytes Relative: 6 %
NEUTROS ABS: 4.8 10*3/uL (ref 1.7–7.7)
Neutrophils Relative %: 65 %
Platelets: 305 10*3/uL (ref 150–400)
RBC: 4.77 MIL/uL (ref 3.87–5.11)
RDW: 13.2 % (ref 11.5–15.5)
WBC: 7.3 10*3/uL (ref 4.0–10.5)

## 2015-08-27 LAB — COMPREHENSIVE METABOLIC PANEL
ALK PHOS: 94 U/L (ref 38–126)
ALT: 16 U/L (ref 14–54)
AST: 24 U/L (ref 15–41)
Albumin: 3.9 g/dL (ref 3.5–5.0)
Anion gap: 11 (ref 5–15)
BILIRUBIN TOTAL: 0.6 mg/dL (ref 0.3–1.2)
BUN: 15 mg/dL (ref 6–20)
CALCIUM: 9.9 mg/dL (ref 8.9–10.3)
CO2: 24 mmol/L (ref 22–32)
Chloride: 102 mmol/L (ref 101–111)
Creatinine, Ser: 1.05 mg/dL — ABNORMAL HIGH (ref 0.44–1.00)
GFR calc Af Amer: 59 mL/min — ABNORMAL LOW (ref >60)
GFR, EST NON AFRICAN AMERICAN: 51 mL/min — AB (ref >60)
Glucose, Bld: 98 mg/dL (ref 65–99)
Potassium: 4 mmol/L (ref 3.5–5.1)
Sodium: 137 mmol/L (ref 135–145)
TOTAL PROTEIN: 7.3 g/dL (ref 6.5–8.1)

## 2015-08-27 NOTE — Progress Notes (Signed)
Called office and left message for Dr. Dalbert Batman that patient has allergy to penicillin and ancef has been ordered.

## 2015-08-27 NOTE — H&P (Signed)
[name]  Location: [facility] Surgery Patient #: [id number] DOB: Jun 24, 1941 Married / Language: English / Race: White Female          History of Present Illness  The patient is a 74 year old female who presents for evaluation of gall stones. Miss [name] returns with her husband to discuss her gallbladder problems. I previously saw her on June 10, 2015. She had been having right upper back pain often postprandial but sometimes on an empty stomach. No nausea vomiting or dysphagia. No pain with swallowing. She does have some heartburn which is well controlled on omeprazole Upper endoscopy 2012 was normal. Upper GI shows a tiny hiatal hernia but no obstruction ultrasound shows a 1.6 cm gallstone lodged in the neck of the gallbladder. Hepatobiliary scan shows good visualization of the gallbladder but the ejection fraction is diminished to 17%. She has seen [name] from orthopedics who did not find any abnormality of spine or other musculoskeletal problems. History of paraesophageal hernia repair 2010 which helped her a lot. History of brainstem CVA. Hypertension. Depression and anxiety. Hyperlipidemia. I offered cholecystectomy to her and told her that I thought this would be appropriate considering her symptoms and workup to date. She would like to go ahead with cholecystectomy. She will be scheduled for laparoscopic cholecystectomy with cholangiogram, possible open cholecystectomy. I discussed the indications, details, techniques, and numerous risk of the surgery with her. She is aware the risk of bleeding, infection, conversion to open laparotomy, wound hernia, bile leak, injury to adjacent organs with major reconstructive surgery, possibility that distal stomach not resolve her symptoms. Cardiac pulmonary and thromboembolic problems. She understands all of these issues. At this time all her questions are answered. She agrees with this  plan.   Allergies  Ambien CR *HYPNOTICS/SEDATIVES/SLEEP DISORDER AGENTS* Penicillin V *PENICILLINS* Sulfa 10 *OPHTHALMIC AGENTS*  Medication History  Azor (10-40MG  Tablet, Oral) Active. Omeprazole (20MG  Capsule DR, Oral) Active. Aspirin (325MG  Tablet, Oral) Active. Vitamin B12 (100MCG Tablet, Oral) Active. PROzac (40MG  Capsule, Oral) Active. Antivert (25MG  Tablet, Oral) Active. Zofran (4MG  Tablet, Oral) Active. Vitamin D (Cholecalciferol) (1000UNIT Capsule, Oral) Active. Medications Reconciled  Vitals   Weight: 160 lb Height: 60.5in Body Surface Area: 1.76 m Body Mass Index: 30.73 kg/m Temp.: 97.70F(Temporal)  Pulse: 70 (Regular)  BP: 126/80 (Sitting, Left Arm, Standard)    Physical Exam General Mental Status-Alert. General Appearance-Not in acute distress. Build & Nutrition-Well nourished. Posture-Normal posture. Gait-Normal.  Head and Neck Head-normocephalic, atraumatic with no lesions or palpable masses. Trachea-midline. Thyroid Gland Characteristics - normal size and consistency and no palpable nodules.  Chest and Lung Exam Chest and lung exam reveals -on auscultation, normal breath sounds, no adventitious sounds and normal vocal resonance.  Cardiovascular Cardiovascular examination reveals -normal heart sounds, regular rate and rhythm with no murmurs and femoral artery auscultation bilaterally reveals normal pulses, no bruits, no thrills.  Abdomen Inspection Inspection of the abdomen reveals - No Hernias. Palpation/Percussion Palpation and Percussion of the abdomen reveal - Soft, Non Tender, No Rigidity (guarding), No hepatosplenomegaly and No Palpable abdominal masses. Note: Well-healed trocar sites from PE H repair. Umbilicus normal.   Neurologic Neurologic evaluation reveals -alert and oriented x 3 with no impairment of recent or remote memory, normal attention span and ability to concentrate, normal  sensation and normal coordination.  Musculoskeletal Normal Exam - Bilateral-Upper Extremity Strength Normal and Lower Extremity Strength Normal.    Assessment & Plan  GALLSTONES (574.20  K80.20) Current Plans  Schedule for Surgery Your  ultrasound shows a 1.6 cm gallstone which appears to be fixed in the gallbladder neck, but no inflammation or other mass. your upper GI shows a tiny recurrent hiatal hernia, but nothing bad. You continuing to have these episodes of right back pain which sometimes occur after meals and sometimes not. I suspect that your gallbladder is causing your symptoms, considering the extensive workup to date. I think that the next step in your care is to proceed with gallbladder surgery. Hopefully this will resolve your symptoms. We have discussed the indications, techniques, and risks of this surgery in detail Please read the information pamphlet that I gave you and read the printed information. We will schedule this surgery for you in the near future Pt Education - Laparoscopic Cholecystectomy: laparoscopic cholecystectomy   UPPER BACK PAIN ON RIGHT SIDE (724.5  M54.9) HISTORY OF CVA IN ADULTHOOD (V12.54  Z86.73) HISTORY OF REPAIR OF HIATAL HERNIA (V15.29  Z98.89) CHRONIC GERD (530.81  K21.9) ANXIETY AND DEPRESSION (300.00  F41.9) HYPERLIPIDEMIA, ACQUIRED (272.4  E78.5) HYPERTENSION, BENIGN (401.1  I10)   [name], M.D., [facility] General and Minimally invasive Surgery Breast and Colorectal Surgery Office:   [phone] Pager:   [phone]

## 2015-08-28 ENCOUNTER — Ambulatory Visit (HOSPITAL_COMMUNITY): Payer: Medicare Other | Admitting: Anesthesiology

## 2015-08-28 ENCOUNTER — Encounter (HOSPITAL_COMMUNITY): Payer: Self-pay | Admitting: *Deleted

## 2015-08-28 ENCOUNTER — Encounter (HOSPITAL_COMMUNITY)
Admission: RE | Disposition: A | Discharge status: Home / Self Care | Payer: Self-pay | Source: Ambulatory Visit | Attending: General Surgery

## 2015-08-28 ENCOUNTER — Ambulatory Visit (HOSPITAL_COMMUNITY)
Admission: RE | Admit: 2015-08-28 | Discharge: 2015-08-28 | Disposition: A | Discharge status: Home / Self Care | Payer: Medicare Other | Source: Ambulatory Visit | Attending: General Surgery | Admitting: General Surgery

## 2015-08-28 DIAGNOSIS — F419 Anxiety disorder, unspecified: Secondary | ICD-10-CM | POA: Diagnosis not present

## 2015-08-28 DIAGNOSIS — Z8673 Personal history of transient ischemic attack (TIA), and cerebral infarction without residual deficits: Secondary | ICD-10-CM | POA: Diagnosis not present

## 2015-08-28 DIAGNOSIS — K8020 Calculus of gallbladder without cholecystitis without obstruction: Secondary | ICD-10-CM | POA: Diagnosis not present

## 2015-08-28 DIAGNOSIS — Z7982 Long term (current) use of aspirin: Secondary | ICD-10-CM | POA: Diagnosis not present

## 2015-08-28 DIAGNOSIS — M1990 Unspecified osteoarthritis, unspecified site: Secondary | ICD-10-CM | POA: Diagnosis not present

## 2015-08-28 DIAGNOSIS — E785 Hyperlipidemia, unspecified: Secondary | ICD-10-CM | POA: Insufficient documentation

## 2015-08-28 DIAGNOSIS — K8010 Calculus of gallbladder with chronic cholecystitis without obstruction: Secondary | ICD-10-CM | POA: Diagnosis not present

## 2015-08-28 DIAGNOSIS — F329 Major depressive disorder, single episode, unspecified: Secondary | ICD-10-CM | POA: Diagnosis not present

## 2015-08-28 DIAGNOSIS — I10 Essential (primary) hypertension: Secondary | ICD-10-CM | POA: Insufficient documentation

## 2015-08-28 DIAGNOSIS — K219 Gastro-esophageal reflux disease without esophagitis: Secondary | ICD-10-CM | POA: Insufficient documentation

## 2015-08-28 HISTORY — PX: CHOLECYSTECTOMY: SHX55

## 2015-08-28 SURGERY — LAPAROSCOPIC CHOLECYSTECTOMY
Anesthesia: General | Site: Abdomen

## 2015-08-28 MED ORDER — LIDOCAINE HCL (CARDIAC) 20 MG/ML IV SOLN
INTRAVENOUS | Status: AC
  Filled 2015-08-28: qty 5

## 2015-08-28 MED ORDER — FENTANYL CITRATE (PF) 100 MCG/2ML IJ SOLN
INTRAMUSCULAR | Status: AC
  Administered 2015-08-28: 25 ug via INTRAVENOUS
  Filled 2015-08-28: qty 2

## 2015-08-28 MED ORDER — LACTATED RINGERS IV SOLN
INTRAVENOUS | Status: DC
  Administered 2015-08-28: 10:00:00 via INTRAVENOUS

## 2015-08-28 MED ORDER — FENTANYL CITRATE (PF) 100 MCG/2ML IJ SOLN
INTRAMUSCULAR | Status: DC | PRN
Start: 2015-08-28 — End: 2015-08-28
  Administered 2015-08-28: 100 ug via INTRAVENOUS
  Administered 2015-08-28: 50 ug via INTRAVENOUS

## 2015-08-28 MED ORDER — ONDANSETRON HCL 4 MG/2ML IJ SOLN
INTRAMUSCULAR | Status: DC | PRN
  Administered 2015-08-28: 4 mg via INTRAVENOUS

## 2015-08-28 MED ORDER — FENTANYL CITRATE (PF) 250 MCG/5ML IJ SOLN
INTRAMUSCULAR | Status: AC
  Filled 2015-08-28: qty 5

## 2015-08-28 MED ORDER — SUGAMMADEX SODIUM 200 MG/2ML IV SOLN
INTRAVENOUS | Status: DC | PRN
  Administered 2015-08-28: 200 mg via INTRAVENOUS

## 2015-08-28 MED ORDER — SUGAMMADEX SODIUM 200 MG/2ML IV SOLN
INTRAVENOUS | Status: AC
  Filled 2015-08-28: qty 2

## 2015-08-28 MED ORDER — DEXAMETHASONE SODIUM PHOSPHATE 10 MG/ML IJ SOLN
INTRAMUSCULAR | Status: DC | PRN
  Administered 2015-08-28: 8 mg via INTRAVENOUS

## 2015-08-28 MED ORDER — SODIUM CHLORIDE 0.9 % IJ SOLN: 3.0000 mL | Freq: Two times a day (BID) | INTRAMUSCULAR | Status: DC

## 2015-08-28 MED ORDER — LACTATED RINGERS IV SOLN
INTRAVENOUS | Status: DC | PRN
  Administered 2015-08-28: 10:00:00 via INTRAVENOUS

## 2015-08-28 MED ORDER — HYDROCODONE-ACETAMINOPHEN 7.5-325 MG PO TABS: 1.0000 | ORAL_TABLET | Freq: Once | ORAL | Status: DC | PRN

## 2015-08-28 MED ORDER — BUPIVACAINE-EPINEPHRINE 0.5% -1:200000 IJ SOLN
INTRAMUSCULAR | Status: DC | PRN
  Administered 2015-08-28: 12 mL

## 2015-08-28 MED ORDER — FENTANYL CITRATE (PF) 100 MCG/2ML IJ SOLN
25.0000 ug | INTRAMUSCULAR | Status: DC | PRN
  Administered 2015-08-28 (×2): 25 ug via INTRAVENOUS

## 2015-08-28 MED ORDER — GLYCOPYRROLATE 0.2 MG/ML IJ SOLN
INTRAMUSCULAR | Status: AC
  Filled 2015-08-28: qty 4

## 2015-08-28 MED ORDER — CEFAZOLIN SODIUM-DEXTROSE 2-3 GM-% IV SOLR
INTRAVENOUS | Status: AC
  Filled 2015-08-28: qty 50

## 2015-08-28 MED ORDER — CEFAZOLIN SODIUM-DEXTROSE 2-3 GM-% IV SOLR
INTRAVENOUS | Status: DC | PRN
  Administered 2015-08-28: 2 g via INTRAVENOUS

## 2015-08-28 MED ORDER — ROCURONIUM BROMIDE 100 MG/10ML IV SOLN
INTRAVENOUS | Status: DC | PRN
  Administered 2015-08-28: 40 mg via INTRAVENOUS

## 2015-08-28 MED ORDER — PHENYLEPHRINE HCL 10 MG/ML IJ SOLN
INTRAMUSCULAR | Status: DC | PRN
  Administered 2015-08-28 (×2): 40 ug via INTRAVENOUS

## 2015-08-28 MED ORDER — 0.9 % SODIUM CHLORIDE (POUR BTL) OPTIME
TOPICAL | Status: DC | PRN
  Administered 2015-08-28: 1000 mL

## 2015-08-28 MED ORDER — ACETAMINOPHEN 325 MG PO TABS: 650.0000 mg | ORAL_TABLET | ORAL | Status: DC | PRN

## 2015-08-28 MED ORDER — OXYCODONE HCL 5 MG PO TABS
5.0000 mg | ORAL_TABLET | ORAL | Status: DC | PRN
  Administered 2015-08-28: 10 mg via ORAL

## 2015-08-28 MED ORDER — SODIUM CHLORIDE 0.9 % IV SOLN: INTRAVENOUS | Status: DC

## 2015-08-28 MED ORDER — ACETAMINOPHEN 650 MG RE SUPP: 650.0000 mg | RECTAL | Status: DC | PRN

## 2015-08-28 MED ORDER — DEXAMETHASONE SODIUM PHOSPHATE 4 MG/ML IJ SOLN
INTRAMUSCULAR | Status: AC
  Filled 2015-08-28: qty 4

## 2015-08-28 MED ORDER — OXYCODONE HCL 5 MG PO TABS
ORAL_TABLET | ORAL | Status: AC
  Administered 2015-08-28: 10 mg via ORAL
  Filled 2015-08-28: qty 2

## 2015-08-28 MED ORDER — SODIUM CHLORIDE 0.9 % IR SOLN
Status: DC | PRN
  Administered 2015-08-28: 1000 mL

## 2015-08-28 MED ORDER — PROPOFOL 10 MG/ML IV BOLUS
INTRAVENOUS | Status: AC
  Filled 2015-08-28: qty 20

## 2015-08-28 MED ORDER — LIDOCAINE HCL (CARDIAC) 20 MG/ML IV SOLN
INTRAVENOUS | Status: DC | PRN
  Administered 2015-08-28: 50 mg via INTRAVENOUS

## 2015-08-28 MED ORDER — CHLORHEXIDINE GLUCONATE 4 % EX LIQD: 1.0000 "application " | Freq: Once | CUTANEOUS | Status: DC

## 2015-08-28 MED ORDER — HYDROCODONE-ACETAMINOPHEN 5-325 MG PO TABS: 1.0000 | ORAL_TABLET | Freq: Four times a day (QID) | ORAL | Status: DC | PRN

## 2015-08-28 MED ORDER — PROPOFOL 10 MG/ML IV BOLUS
INTRAVENOUS | Status: DC | PRN
  Administered 2015-08-28: 200 mg via INTRAVENOUS

## 2015-08-28 MED ORDER — SODIUM CHLORIDE 0.9 % IV SOLN: 250.0000 mL | INTRAVENOUS | Status: DC | PRN

## 2015-08-28 MED ORDER — NEOSTIGMINE METHYLSULFATE 10 MG/10ML IV SOLN
INTRAVENOUS | Status: AC
  Filled 2015-08-28: qty 3

## 2015-08-28 MED ORDER — PROMETHAZINE HCL 25 MG/ML IJ SOLN
6.2500 mg | INTRAMUSCULAR | Status: DC | PRN
Start: 2015-08-28 — End: 2015-08-28

## 2015-08-28 MED ORDER — FENTANYL CITRATE (PF) 100 MCG/2ML IJ SOLN: 25.0000 ug | INTRAMUSCULAR | Status: DC | PRN

## 2015-08-28 MED ORDER — SODIUM CHLORIDE 0.9 % IJ SOLN: 3.0000 mL | INTRAMUSCULAR | Status: DC | PRN

## 2015-08-28 SURGICAL SUPPLY — 41 items
APPLIER CLIP ROT 10 11.4 M/L (STAPLE) ×4
BLADE SURG ROTATE 9660 (MISCELLANEOUS) IMPLANT
CANISTER SUCTION 2500CC (MISCELLANEOUS) ×4 IMPLANT
CHLORAPREP W/TINT 26ML (MISCELLANEOUS) ×4 IMPLANT
CLIP APPLIE ROT 10 11.4 M/L (STAPLE) ×2 IMPLANT
COVER MAYO STAND STRL (DRAPES) IMPLANT
COVER SURGICAL LIGHT HANDLE (MISCELLANEOUS) ×4 IMPLANT
DERMABOND ADVANCED (GAUZE/BANDAGES/DRESSINGS) ×2
DERMABOND ADVANCED .7 DNX12 (GAUZE/BANDAGES/DRESSINGS) ×2 IMPLANT
DRAPE C-ARM 42X72 X-RAY (DRAPES) IMPLANT
ELECT REM PT RETURN 9FT ADLT (ELECTROSURGICAL) ×4
ELECTRODE REM PT RTRN 9FT ADLT (ELECTROSURGICAL) ×2 IMPLANT
GLOVE BIO SURGEON STRL SZ7 (GLOVE) ×4 IMPLANT
GLOVE BIOGEL PI IND STRL 7.0 (GLOVE) ×6 IMPLANT
GLOVE BIOGEL PI IND STRL 7.5 (GLOVE) ×2 IMPLANT
GLOVE BIOGEL PI INDICATOR 7.0 (GLOVE) ×6
GLOVE BIOGEL PI INDICATOR 7.5 (GLOVE) ×2
GLOVE ECLIPSE 7.5 STRL STRAW (GLOVE) ×8 IMPLANT
GLOVE EUDERMIC 7 POWDERFREE (GLOVE) ×4 IMPLANT
GOWN STRL REUS W/ TWL LRG LVL3 (GOWN DISPOSABLE) ×4 IMPLANT
GOWN STRL REUS W/ TWL XL LVL3 (GOWN DISPOSABLE) ×2 IMPLANT
GOWN STRL REUS W/TWL LRG LVL3 (GOWN DISPOSABLE) ×4
GOWN STRL REUS W/TWL XL LVL3 (GOWN DISPOSABLE) ×2
KIT BASIN OR (CUSTOM PROCEDURE TRAY) ×4 IMPLANT
KIT ROOM TURNOVER OR (KITS) ×4 IMPLANT
NS IRRIG 1000ML POUR BTL (IV SOLUTION) ×4 IMPLANT
PAD ARMBOARD 7.5X6 YLW CONV (MISCELLANEOUS) ×4 IMPLANT
POUCH SPECIMEN RETRIEVAL 10MM (ENDOMECHANICALS) ×4 IMPLANT
SCISSORS LAP 5X35 DISP (ENDOMECHANICALS) ×4 IMPLANT
SET CHOLANGIOGRAPH 5 50 .035 (SET/KITS/TRAYS/PACK) IMPLANT
SET IRRIG TUBING LAPAROSCOPIC (IRRIGATION / IRRIGATOR) ×4 IMPLANT
SLEEVE ENDOPATH XCEL 5M (ENDOMECHANICALS) ×4 IMPLANT
SPECIMEN JAR SMALL (MISCELLANEOUS) ×4 IMPLANT
SUT MNCRL AB 4-0 PS2 18 (SUTURE) ×4 IMPLANT
TOWEL OR 17X24 6PK STRL BLUE (TOWEL DISPOSABLE) ×4 IMPLANT
TOWEL OR 17X26 10 PK STRL BLUE (TOWEL DISPOSABLE) ×4 IMPLANT
TRAY LAPAROSCOPIC MC (CUSTOM PROCEDURE TRAY) ×4 IMPLANT
TROCAR XCEL BLUNT TIP 100MML (ENDOMECHANICALS) ×4 IMPLANT
TROCAR XCEL NON-BLD 11X100MML (ENDOMECHANICALS) ×4 IMPLANT
TROCAR XCEL NON-BLD 5MMX100MML (ENDOMECHANICALS) ×4 IMPLANT
TUBING INSUFFLATION (TUBING) ×4 IMPLANT

## 2015-08-28 NOTE — Discharge Instructions (Signed)
[facility] ______[facility] °LAPAROSCOPIC SURGERY: POST OP INSTRUCTIONS °Always review your discharge instruction sheet given to you by the facility where your surgery was performed. °IF YOU HAVE DISABILITY OR FAMILY LEAVE FORMS, YOU MUST BRING THEM TO THE OFFICE FOR PROCESSING.   °DO NOT GIVE THEM TO YOUR DOCTOR. ° °1. A prescription for pain medication may be given to you upon discharge.  Take your pain medication as prescribed, if needed.  If narcotic pain medicine is not needed, then you may take acetaminophen (Tylenol) or ibuprofen (Advil) as needed. °2. Take your usually prescribed medications unless otherwise directed. °3. If you need a refill on your pain medication, please contact your pharmacy.  They will contact our office to request authorization. Prescriptions will not be filled after 5pm or on week-ends. °4. You should follow a light diet the first few days after arrival home, such as soup and crackers, etc.  Be sure to include lots of fluids daily. °5. Most patients will experience some swelling and bruising in the area of the incisions.  Ice packs will help.  Swelling and bruising can take several days to resolve.  °6. It is common to experience some constipation if taking pain medication after surgery.  Increasing fluid intake and taking a stool softener (such as Colace) will usually help or prevent this problem from occurring.  A mild laxative (Milk of Magnesia or Miralax) should be taken according to package instructions if there are no bowel movements after 48 hours. °7. Unless discharge instructions indicate otherwise, you may remove your bandages 24-48 hours after surgery, and you may shower at that time.  You may have steri-strips (small skin tapes) in place directly over the incision.  These strips should be left on the skin for 7-10 days.  If your surgeon used skin glue on the incision, you may shower in 24 hours.  The glue will flake off over the next 2-3 weeks.  Any sutures or  staples will be removed at the office during your follow-up visit. °8. ACTIVITIES:  You may resume regular (light) daily activities beginning the next day--such as daily self-care, walking, climbing stairs--gradually increasing activities as tolerated.  You may have sexual intercourse when it is comfortable.  Refrain from any heavy lifting or straining until approved by your doctor. °a. You may drive when you are no longer taking prescription pain medication, you can comfortably wear a seatbelt, and you can safely maneuver your car and apply brakes. °b. RETURN TO WORK:  __________________________________________________________ °9. You should see your doctor in the office for a follow-up appointment approximately 2-3 weeks after your surgery.  Make sure that you call for this appointment within a day or two after you arrive home to insure a convenient appointment time. °10. OTHER INSTRUCTIONS: __________________________________________________________________________________________________________________________ __________________________________________________________________________________________________________________________ °WHEN TO CALL YOUR DOCTOR: °1. Fever over 101.0 °2. Inability to urinate °3. Continued bleeding from incision. °4. Increased pain, redness, or drainage from the incision. °5. Increasing abdominal pain ° °The clinic staff is available to answer your questions during regular business hours.  Please don’t hesitate to call and ask to speak to one of the nurses for clinical concerns.  If you have a medical emergency, go to the nearest emergency room or call 911.  A surgeon from [facility] is always on call at the hospital. °[address], [address], [place], [place]  [address] ? [address] [address], [place], [place]   [address] °[phone] ? [phone] ? FAX [phone] °Web site:   [email or web address] °

## 2015-08-28 NOTE — Op Note (Signed)
Patient Name:           [name] [facility]   Date of Surgery:        08/28/2015  Pre op Diagnosis:      Chronic cholecystitis with cholelithiasis  Post op Diagnosis:    Chronic cholecystitis with cholelithiasis  Procedure:                 Laparoscopic cholecystectomy  Surgeon:                     Edsel Petrin. Dalbert Batman, M.D., FACS  Assistant:                      OR staff  Operative Indications:   The patient is a 74 year old female who presents for evaluation of gall stones. [name] returns with her husband to discuss her gallbladder problems. I previously saw her on June 10, 2015. She had been having right upper back pain often postprandial but sometimes on an empty stomach. No nausea vomiting or dysphagia. No pain with swallowing. She does have some heartburn which is well controlled on omeprazole Upper endoscopy 2012 was normal. Upper GI shows a tiny hiatal hernia but no obstruction.   I performed a laparoscopic para- esophageal hernia repair several years ago and she has done well with that.  ultrasound shows a 1.6 cm gallstone lodged in the neck of the gallbladder. Hepatobiliary scan shows good visualization of the gallbladder but the ejection fraction is diminished to 17%. She has seen Lindwood Qua from orthopedics who did not find any abnormality of spine or other musculoskeletal problems. . History of brainstem CVA. Hypertension. Depression and anxiety. Hyperlipidemia. I offered cholecystectomy to her and told her that I thought this would be appropriate considering her symptoms and workup to date. She would like to go ahead with cholecystectomy.  She is brought to the operating room electively  Operative Findings:       The gallbladder was relatively thin-walled.  Minimal adhesions.  Anatomy of the cystic duct, cystic artery, and common bile duct were conventional.  Preop liver function tests were normal.  The liver, stomach, duodenum, small intestine, and large  intestine were grossly normal to inspection.  Procedure in Detail:          Following the induction of general endotracheal anesthesia the patient's abdomen was prepped and draped in a sterile fashion, surgical timeout was performed, intravenous antibiotics were given.  0.5% Marcaine with epinephrine was used as a local infiltration anesthetic.     A vertical incision was made in the lower rim of the umbilicus.  The fascia was incised in the midline and the abdominal cavity entered under direct vision.  An 11 mm Hassan trocar was inserted and secured with the Purstring suture of 0 Vicryl.  Pneumoperitoneum was created and video camera was inserted.  There were minimal adhesions.  A  Trochar was placed in the subxiphoid region and 2 small trochars placed in the right upper quadrant.  Gallbladder fundus was identified and elevated.  The infundibulum was identified and retracted laterally.  The peritoneum over the neck of the gallbladder was incised and we carefully dissected out the cystic duct and the cystic artery.  The cystic duct and cystic artery were running parallel to each other I was able to isolate each of the structures and create a large window and a very good critical view.  The structures were then secured with multiple medical  clips and divided, gallbladder dissected from its bed with electrocautery, placed in a specimen bag and removed.  I made one small pinhole in the gallbladder and spilled a little bit of clear bile but no stones.  The operative field was copiously irrigated.  Irrigation fluid became completely clear.  I inspected the areas of cystic duct stump and cystic artery stump closure and they look  secure.  After removing all of the irrigation fluid I released the pneumoperitoneum and removed the trochars.  The fascia at the umbilicus was closed with 0 Vicryl sutures and the skin incisions closed with subcuticular 4-0 Monocryl sutures and Dermabond.  Patient tolerated the procedure well  was taken to PACU in stable condition.  EBL 10 mL.  Counts correct.  Complications none.     Edsel Petrin. Dalbert Batman, M.D., FACS General and Minimally Invasive Surgery Breast and Colorectal Surgery  08/28/2015 10:56 AM

## 2015-08-28 NOTE — Transfer of Care (Signed)
Immediate Anesthesia Transfer of Care Note  Patient: [name] [facility]  Procedure(s) Performed: Procedure(s): LAPAROSCOPIC CHOLECYSTECTOMY (N/A)  Patient Location: PACU  Anesthesia Type:General  Level of Consciousness: awake, alert  and oriented  Airway & Oxygen Therapy: Patient Spontanous Breathing and Patient connected to face mask oxygen  Post-op Assessment: Report given to RN, Post -op Vital signs reviewed and stable and Patient moving all extremities X 4  Post vital signs: Reviewed and stable  Last Vitals:  Filed Vitals:   08/28/15 0908  BP: 156/89  Pulse: 78  Temp: 37.2 C  Resp: 20    Complications: No apparent anesthesia complications

## 2015-08-28 NOTE — Anesthesia Preprocedure Evaluation (Addendum)
Anesthesia Evaluation  Patient identified by MRN, date of birth, ID band Patient awake    Reviewed: Allergy & Precautions, NPO status , Patient's Chart, lab work & pertinent test results  Airway Mallampati: II  TM Distance: >3 FB     Dental  (+) Teeth Intact, Dental Advidsory Given, Caps   Pulmonary neg pulmonary ROS,    breath sounds clear to auscultation       Cardiovascular hypertension, Pt. on medications  Rhythm:regular Rate:Normal     Neuro/Psych PSYCHIATRIC DISORDERS CVA    GI/Hepatic Neg liver ROS, GERD  ,  Endo/Other  negative endocrine ROS  Renal/GU negative Renal ROS     Musculoskeletal  (+) Arthritis ,   Abdominal   Peds  Hematology negative hematology ROS (+)   Anesthesia Other Findings   Reproductive/Obstetrics                           Anesthesia Physical Anesthesia Plan  ASA: II  Anesthesia Plan: General   Post-op Pain Management:    Induction: Intravenous  Airway Management Planned: Oral ETT  Additional Equipment:   Intra-op Plan:   Post-operative Plan: Extubation in OR  Informed Consent: I have reviewed the patients History and Physical, chart, labs and discussed the procedure including the risks, benefits and alternatives for the proposed anesthesia with the patient or authorized representative who has indicated his/her understanding and acceptance.   Dental advisory given and Dental Advisory Given  Plan Discussed with: CRNA, Anesthesiologist and Surgeon  Anesthesia Plan Comments:        Anesthesia Quick Evaluation

## 2015-08-28 NOTE — Interval H&P Note (Signed)
History and Physical Interval Note:  08/28/2015 9:29 AM  [name]  has presented today for surgery, with the diagnosis of CHOLELITHIASIS  The various methods of treatment have been discussed with the patient and family. After consideration of risks, benefits and other options for treatment, the patient has consented to  Procedure(s): LAPAROSCOPIC CHOLECYSTECTOMY WITH INTRAOPERATIVE CHOLANGIOGRAM POSSIBLE OPEN (N/A) as a surgical intervention .  The patient's history has been reviewed, patient examined, no change in status, stable for surgery.  I have reviewed the patient's chart and labs.  Questions were answered to the patient's satisfaction.     [name]

## 2015-08-28 NOTE — Anesthesia Procedure Notes (Signed)
Procedure Name: Intubation Date/Time: 08/28/2015 10:07 AM Performed by: Neldon Newport Pre-anesthesia Checklist: Patient being monitored, Suction available, Emergency Drugs available, Timeout performed and Patient identified Patient Re-evaluated:Patient Re-evaluated prior to inductionOxygen Delivery Method: Circle system utilized Preoxygenation: Pre-oxygenation with 100% oxygen Intubation Type: IV induction Ventilation: Mask ventilation without difficulty Laryngoscope Size: Mac and 3 Grade View: Grade III Tube type: Oral Tube size: 7.0 mm Number of attempts: 1 Placement Confirmation: positive ETCO2,  ETT inserted through vocal cords under direct vision and breath sounds checked- equal and bilateral Secured at: 21 cm Tube secured with: Tape Dental Injury: Teeth and Oropharynx as per pre-operative assessment

## 2015-08-28 NOTE — Anesthesia Postprocedure Evaluation (Signed)
  Anesthesia Post-op Note  Patient: [name], [name]  Procedure(s) Performed: Procedure(s): LAPAROSCOPIC CHOLECYSTECTOMY (N/A)  Patient Location: PACU  Anesthesia Type:General  Level of Consciousness: awake and alert   Airway and Oxygen Therapy: Patient Spontanous Breathing  Post-op Pain: mild  Post-op Assessment: Post-op Vital signs reviewed              Post-op Vital Signs: Reviewed  Last Vitals:  Filed Vitals:   08/28/15 1310  BP: 145/72  Pulse: 94  Temp:   Resp: 15    Complications: No apparent anesthesia complications

## 2015-08-28 NOTE — Progress Notes (Signed)
Dr. Dalbert Batman in to see pt, this nurse asked about penicillin allergy. Okay for pt to have IV Ancef 2g.

## 2015-08-29 ENCOUNTER — Encounter (HOSPITAL_COMMUNITY): Payer: Self-pay | Admitting: General Surgery

## 2015-09-24 DIAGNOSIS — Z23 Encounter for immunization: Secondary | ICD-10-CM | POA: Diagnosis not present

## 2015-09-24 DIAGNOSIS — M5430 Sciatica, unspecified side: Secondary | ICD-10-CM | POA: Diagnosis not present

## 2015-09-24 DIAGNOSIS — M546 Pain in thoracic spine: Secondary | ICD-10-CM | POA: Diagnosis not present

## 2015-09-24 DIAGNOSIS — R197 Diarrhea, unspecified: Secondary | ICD-10-CM | POA: Diagnosis not present

## 2015-10-08 ENCOUNTER — Ambulatory Visit: Payer: Medicare Other | Attending: Internal Medicine

## 2015-10-08 DIAGNOSIS — M546 Pain in thoracic spine: Secondary | ICD-10-CM | POA: Diagnosis not present

## 2015-10-08 DIAGNOSIS — G8929 Other chronic pain: Secondary | ICD-10-CM | POA: Diagnosis not present

## 2015-10-08 DIAGNOSIS — R293 Abnormal posture: Secondary | ICD-10-CM | POA: Insufficient documentation

## 2015-10-08 NOTE — Therapy (Signed)
[place], [place], [address] Phone: [phone]   Fax:  [phone]  Physical Therapy Evaluation  Patient Details  Name: [name] MRN: [id number] Date of Birth: [birth year] Referring Provider: Mertha Finders MD  Encounter Date: 10/08/2015      PT End of Session - 10/08/15 1015    Visit Number 1   Number of Visits 16   Date for PT Re-Evaluation 12/03/15   Authorization Type [employer]    Authorization Time Period KX at visit 15   Authorization - Visit Number 1   Authorization - Number of Visits 16   PT Start Time 0930   PT Stop Time 1015   PT Time Calculation (min) 45 min   Activity Tolerance Patient tolerated treatment well   Behavior During Therapy [facility] for tasks assessed/performed      Past Medical History  Diagnosis Date  . Hypertension   . CVA (cerebral vascular accident) ([place])   . Depression   . GERD (gastroesophageal reflux disease)   . Arthritis     Past Surgical History  Procedure Laterality Date  . Wisdom tooth extraction    . Hiatal hernia repair    . Cholecystectomy N/A 08/28/2015    Procedure: LAPAROSCOPIC CHOLECYSTECTOMY;  Surgeon: Fanny Skates, MD;  Location: [place];  Service: General;  Laterality: N/A;    There were no vitals filed for this visit.  Visit Diagnosis:  Abnormal posture  Chronic thoracic spine pain      Subjective Assessment - 10/08/15 0940    Subjective She reports since 2012 she has had RT scapula pain. She was treated before with manual treatment and was better for 6 weeks.  Pain can start with walking  She also has pain in back and to leg but she is more interested in dealing with RT posterior shoulder pain.     Pertinent History CVA , Gall bladder Sx, Hiattal hernius Sx, MRSA   Limitations --  all activity when hurting   How long can you sit comfortably? As need without pain.    Diagnostic tests MRI Thoracic spine: DJD    Patient Stated  Goals To get it to stop hurting   Currently in Pain? No/denies   Multiple Pain Sites No            [facility] PT Assessment - 10/08/15 0930    Assessment   Medical Diagnosis sciatica   Referring Provider Mertha Finders MD   Onset Date/Surgical Date --  2012   Hand Dominance Right   Next MD Visit Not scheduled   Prior Therapy PT in past year for same issue   Precautions   Precautions --   Precaution Comments Limit heavy lifting of 10 pounds  due to gall bladder surgery.    Restrictions   Weight Bearing Restrictions No   Balance Screen   Has the patient fallen in the past 6 months No   Has the patient had a decrease in activity level because of a fear of falling?  No   Is the patient reluctant to leave their home because of a fear of falling?  No   Home Environment   Living Environment Private residence   Living Arrangements Spouse/significant other   Type of [place]   Prior Function   Level of Independence Needs assistance with homemaking  since CVA 2012   Cognition   Overall Cognitive Status Within Functional Limits for tasks assessed   Posture/Postural Control   Posture  Comments Forward head and increased upper thoracic kyphosis, RT shoulder lower than LT .Marland Kitchen      ROM / Strength   AROM / PROM / Strength AROM;Strength   AROM   Overall AROM Comments Cdervical ROM WNL and did not cause pain . Negative Spurlings   AROM Assessment Site Shoulder;Thoracic   Right/Left Shoulder Left;Right   Right Shoulder Extension 65 Degrees   Right Shoulder Flexion 158 Degrees   Right Shoulder ABduction 160 Degrees   Right Shoulder Internal Rotation 55 Degrees   Right Shoulder External Rotation 85 Degrees   Left Shoulder Extension 65 Degrees   Left Shoulder Flexion 160 Degrees   Left Shoulder ABduction 160 Degrees   Left Shoulder Internal Rotation 55 Degrees   Left Shoulder External Rotation 80 Degrees   Thoracic Flexion 45   Thoracic Extension 10   Thoracic - Right Side Bend 30    Thoracic - Left Side Bend 30   Thoracic - Right Rotation 62   Thoracic - Left Rotation 70   Strength   Overall Strength Comments Both UE WNL   Flexibility   Soft Tissue Assessment /Muscle Length yes   Palpation   Palpation comment Mildly tender medial scapul border and paraspinals RT thoracic spine.                            PT Education - 10/08/15 1005    Education provided Yes   Education Details POC   Person(s) Educated Patient   Methods Explanation   Comprehension Verbalized understanding          PT Short Term Goals - 10/08/15 1024    PT SHORT TERM GOAL #1   Title She will be independent with inital HEP   Time 4   Period Weeks   Status New   PT SHORT TERM GOAL #2   Title She will report incidents of pain decreased 30% or more in intensity or frequency.    Time 4   Period Weeks   Status New           PT Long Term Goals - 10/08/15 1025    PT LONG TERM GOAL #1   Title She will be independent in all hEP issued as of last visit   Time 8   Period Weeks   Status New   PT LONG TERM GOAL #2   Title She will report no incidents of pain for at least 10 days   Time 8   Period Weeks   Status New   PT LONG TERM GOAL #3   Title She will report generally no tenderness in paraspinals /medial scapula on RT    Time 8   Period Weeks   Status New               Plan - 10/08/15 1016    Clinical Impression Statement Ms Towns presents with intermittant RT upper  back/scapula pain since 2012 post CVA. She has no specific triggers for pain and the pain witll go away without intervention. She wa treated in past with some success but pain still comes an dgoes. She has good ROM and normal strength in UE/   She may benefit from PT    Pt will benefit from skilled therapeutic intervention in order to improve on the following deficits Pain;Postural dysfunction   Rehab Potential Good   PT Frequency 2x / week   PT Duration 8 weeks   PT  Treatment/Interventions Cryotherapy;Electrical Stimulation;Iontophoresis 4mg /ml Dexamethasone;Moist Heat;Ultrasound;Therapeutic exercise;Patient/family education;Taping;Manual techniques;Dry needling   PT Next Visit Plan Manual , modalities PRN, dry needling , Stretching HEP   Consulted and Agree with Plan of Care Patient          G-Codes - 10-24-2015 1027    Functional Assessment Tool Used FOTO 50%   Functional Limitation Other PT primary   Other PT Primary Current Status [id number]) At least 40 percent but less than 60 percent impaired, limited or restricted   Other PT Primary Goal Status [id number]) At least 20 percent but less than 40 percent impaired, limited or restricted       Problem List Patient Active Problem List   Diagnosis Date Noted  . 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 CVA (cerebral vascular accident) [facility])     Darrel Hoover PT 10-24-2015, 10:30 AM  [place], [place], [address] Phone: [phone]   Fax:  [phone]  Name: [name] MRN: [id number] Date of Birth: 10/08/1941

## 2015-10-17 ENCOUNTER — Ambulatory Visit: Payer: Medicare Other | Attending: Internal Medicine | Admitting: Physical Therapy

## 2015-10-17 DIAGNOSIS — M25512 Pain in left shoulder: Secondary | ICD-10-CM | POA: Diagnosis not present

## 2015-10-17 DIAGNOSIS — M549 Dorsalgia, unspecified: Secondary | ICD-10-CM | POA: Insufficient documentation

## 2015-10-17 DIAGNOSIS — M546 Pain in thoracic spine: Secondary | ICD-10-CM | POA: Insufficient documentation

## 2015-10-17 DIAGNOSIS — R293 Abnormal posture: Secondary | ICD-10-CM | POA: Diagnosis not present

## 2015-10-17 DIAGNOSIS — G8929 Other chronic pain: Secondary | ICD-10-CM | POA: Insufficient documentation

## 2015-10-17 DIAGNOSIS — M62830 Muscle spasm of back: Secondary | ICD-10-CM | POA: Insufficient documentation

## 2015-10-17 NOTE — Patient Instructions (Addendum)
Trigger Point Dry Needling  . What is Trigger Point Dry Needling (DN)? o DN is a physical therapy technique used to treat muscle pain and dysfunction. Specifically, DN helps deactivate muscle trigger points (muscle knots).  o A thin filiform needle is used to penetrate the skin and stimulate the underlying trigger point. The goal is for a local twitch response (LTR) to occur and for the trigger point to relax. No medication of any kind is injected during the procedure.   . What Does Trigger Point Dry Needling Feel Like?  o The procedure feels different for each individual patient. Some patients report that they do not actually feel the needle enter the skin and overall the process is not painful. Very mild bleeding may occur. However, many patients feel a deep cramping in the muscle in which the needle was inserted. This is the local twitch response.   Marland Kitchen How Will I feel after the treatment? o Soreness is normal, and the onset of soreness may not occur for a few hours. Typically this soreness does not last longer than two days.  o Bruising is uncommon, however; ice can be used to decrease any possible bruising.  o In rare cases feeling tired or nauseous after the treatment is normal. In addition, your symptoms may get worse before they get better, this period will typically not last longer than 24 hours.   . What Can I do After My Treatment? o Increase your hydration by drinking more water for the next 24 hours. o You may place ice or heat on the areas treated that have become sore, however, do not use heat on inflamed or bruised areas. Heat often brings more relief post needling. o You can continue your regular activities, but vigorous activity is not recommended initially after the treatment for 24 hours. o DN is best combined with other physical therapy such as strengthening, stretching, and other therapies.    Posture Tips DO: - stand tall and erect - keep chin tucked in - keep head and  shoulders in alignment - check posture regularly in mirror or large window - pull head back against headrest in car seat;  Change your position often.  Sit with lumbar support. DON'T: - slouch or slump while watching TV or reading - sit, stand or lie in one position  for too long;  Sitting is especially hard on the spine so if you sit at a desk/use the computer, then stand up often!   Copyright  VHI. All rights reserved.  Posture - Standing   Good posture is important. Avoid slouching and forward head thrust. Maintain curve in low back and align ears over shoul- ders, hips over ankles.  Pull your belly button in toward your back bone.  Even weight in your great toe, little toe and heel ( little triangle Ladeja)  Lift rib cage up as if you have a string from sternum to the sky.  Make sure you chin tuck down.    Copyright  VHI. All rights reserved.  Posture - Sitting   Sit upright, head facing forward. Try using a roll to support lower back. Keep shoulders relaxed, and avoid rounded back. Keep hips level with knees. Avoid crossing legs for long periods.  Sit on sit bones, not your tail bone all the way back into the seat.  Do not perch on edge of chair  Taila!  It puts your neck in an awkward position.  Pectoralis Stretch: Supine (Towel Roll)    Shanda Howells  with rolled towel under spine, letting shoulders relax toward floor. Lie _3__ minutes. Do _1__ times per day.  http://ss.exer.us/355   Copyright  VHI. All rights reserved.     Copyright  VHI. All rights reserved.    [name], PT 10/17/2015 12:00 PM Phone: [phone] Fax: [phone]

## 2015-10-17 NOTE — Therapy (Signed)
[place], [place], [address] Phone: [phone]   Fax:  [phone]  Physical Therapy Treatment  Patient Details  Name: [name] MRN: [id number] Date of Birth: [birth year] Referring Provider: Mertha Finders MD  Encounter Date: 10/17/2015      PT End of Session - 10/17/15 1246    Visit Number 2   Number of Visits 16   Date for PT Re-Evaluation 12/03/15   Authorization Type [employer]    Authorization Time Period KX at visit 15   PT Start Time 1151   PT Stop Time 1246   PT Time Calculation (min) 55 min   Activity Tolerance Patient tolerated treatment well   Behavior During Therapy [facility] for tasks assessed/performed      Past Medical History  Diagnosis Date  . Hypertension   . CVA (cerebral vascular accident) ([place])   . Depression   . GERD (gastroesophageal reflux disease)   . Arthritis     Past Surgical History  Procedure Laterality Date  . Wisdom tooth extraction    . Hiatal hernia repair    . Cholecystectomy N/A 08/28/2015    Procedure: LAPAROSCOPIC CHOLECYSTECTOMY;  Surgeon: Fanny Skates, MD;  Location: [place];  Service: General;  Laterality: N/A;    There were no vitals filed for this visit.  Visit Diagnosis:  Abnormal posture  Chronic thoracic spine pain      Subjective Assessment - 10/17/15 1153    Subjective I remember you really worked my arm out when I had a stroke and I didn;t have pain.  I hope you can do it again.   Pertinent History CVA , Gall bladder Sx, Hiattal hernius Sx, MRSA   Diagnostic tests MRI Thoracic spine: DJD    Patient Stated Goals To get it to stop hurting   Currently in Pain? Yes   Pain Score 2    Pain Location Thoracic  Rigth ribs 5-9   Pain Orientation Right   Pain Descriptors / Indicators Sore   with palpation                         [facility] Adult PT Treatment/Exercise - 10/17/15 1243    Shoulder Exercises: Stretch   Other  Shoulder Stretches towel along spine lengthwise lying in hooklying 5 min   Moist Heat Therapy   Number Minutes Moist Heat 10 Minutes   Moist Heat Location Other (comment)  upper back   Manual Therapy   Joint Mobilization thoracic PA mobs grade T-2 to T-10; rib junction ribs 5,6, 7   Soft tissue mobilization right scapular muscles  and upper trap   Myofascial Release Thoracic paraspinals and medial scapular           Trigger Point Dry Needling - 10/17/15 1206    Consent Given? Yes   Education Handout Provided Yes   Muscles Treated Upper Body Rhomboids;Subscapularis  right side only Erector spinae T 5 to T 6 , rib junctin5,6,7   Rhomboids Response Twitch response elicited;Palpable increased muscle length   Subscapularis Response Twitch response elicited;Palpable increased muscle length              PT Education - 10/17/15 1245    Education provided Yes   Education Details Posture sitting and standing, precautians and aftercare of  trigger point dry needling, thoracic stretch in hook lying   Person(s) Educated Patient   Methods Explanation;Demonstration;Verbal cues;Handout   Comprehension Verbalized understanding;Returned demonstration  PT Short Term Goals - 10/08/15 1024    PT SHORT TERM GOAL #1   Title She will be independent with inital HEP   Time 4   Period Weeks   Status New   PT SHORT TERM GOAL #2   Title She will report incidents of pain decreased 30% or more in intensity or frequency.    Time 4   Period Weeks   Status New           PT Long Term Goals - 10/08/15 1025    PT LONG TERM GOAL #1   Title She will be independent in all hEP issued as of last visit   Time 8   Period Weeks   Status New   PT LONG TERM GOAL #2   Title She will report no incidents of pain for at least 10 days   Time 8   Period Weeks   Status New   PT LONG TERM GOAL #3   Title She will report generally no tenderness in paraspinals /medial scapula on RT    Time 8    Period Weeks   Status New               Plan - 10/17/15 1247    Clinical Impression Statement [name] instructed in initial stretch of thoracic kyphosis and consented to trigger point dry needling.  Pt with trigger point pain at Right rib junction 5, 6, 7.   Pt with fascial restriction and kyphosis with hypomobilityy, Pt closely monitored  for repsonse with trigger point dry needlking   Pt will benefit from skilled therapeutic intervention in order to improve on the following deficits Pain;Postural dysfunction   Rehab Potential Good   PT Frequency 2x / week   PT Duration 8 weeks   PT Treatment/Interventions Cryotherapy;Electrical Stimulation;Iontophoresis 4mg /ml Dexamethasone;Moist Heat;Ultrasound;Therapeutic exercise;Patient/family education;Taping;Manual techniques;Dry needling   PT Next Visit Plan Manual , modalities PRN, dry needling , Stretching HEP asses        Problem List Patient Active Problem List   Diagnosis Date Noted  . 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 CVA (cerebral vascular accident) [facility])   [name], PT 10/17/2015 12:52 PM Phone: [phone] Fax: [facility] [place] [address] [place], [place], [address] Phone: [phone]   Fax:  [phone]  Name: [name] MRN: [id number] Date of Birth: 11-24-1940

## 2015-10-22 ENCOUNTER — Ambulatory Visit: Payer: Medicare Other | Admitting: Physical Therapy

## 2015-10-22 DIAGNOSIS — M62830 Muscle spasm of back: Secondary | ICD-10-CM | POA: Diagnosis not present

## 2015-10-22 DIAGNOSIS — R293 Abnormal posture: Secondary | ICD-10-CM | POA: Diagnosis not present

## 2015-10-22 DIAGNOSIS — M549 Dorsalgia, unspecified: Secondary | ICD-10-CM

## 2015-10-22 DIAGNOSIS — M546 Pain in thoracic spine: Secondary | ICD-10-CM

## 2015-10-22 DIAGNOSIS — G8929 Other chronic pain: Secondary | ICD-10-CM

## 2015-10-22 DIAGNOSIS — M25512 Pain in left shoulder: Secondary | ICD-10-CM | POA: Diagnosis not present

## 2015-10-22 NOTE — Patient Instructions (Signed)
Ms Asplund please roll a towel lengthwise and place from your head down the middle of your spine to stretch your paraspinal muscles while you do these exericses.  Please hold the theraband with thumbs up and out.    Over Head Pull: Narrow Grip       On back, knees bent, feet flat, band across thighs, elbows straight but relaxed. Pull hands apart (start). Keeping elbows straight, bring arms up and over head, hands toward floor. Keep pull steady on band. Hold momentarily. Return slowly, keeping pull steady, back to start. Repeat _10_x2_ times. Band color __red____   Side Pull: Double Arm   On back, knees bent, feet flat. Arms perpendicular to body, shoulder level, elbows straight but relaxed. Pull arms out to sides, elbows straight. Resistance band comes across collarbones, hands toward floor. Hold momentarily. Slowly return to starting position. Repeat 10x2___ times. Band color _red____   Elmer Picker   On back, knees bent, feet flat, left hand on left hip, right hand above left. Pull right arm DIAGONALLY (hip to shoulder) across chest. Bring right arm along head toward floor. Hold momentarily. Slowly return to starting position. Repeat _10x2__ times. Do with left arm. Band color __red____   Shoulder Rotation: Double Arm   On back, knees bent, feet flat, elbows tucked at sides, bent 90, hands palms up. Pull hands apart and down toward floor, keeping elbows near sides. Hold momentarily. Slowly return to starting position. Repeat 10 x2___ times. Band color red_____  .  [name], PT 10/22/2015 10:21 AM Phone: [phone] Fax: [phone]

## 2015-10-22 NOTE — Therapy (Signed)
[place], [place], [address] Phone: [phone]   Fax:  [phone]  Physical Therapy Treatment  Patient Details  Name: [name] MRN: [id number] Date of Birth: [birth year] Referring Provider: Mertha Finders MD  Encounter Date: 10/22/2015      PT End of Session - 10/22/15 1020    Visit Number 3   Number of Visits 16   Date for PT Re-Evaluation 12/03/15   Authorization Type [employer]    Authorization Time Period KX at visit 15   Authorization - Visit Number 3   Authorization - Number of Visits 16   PT Start Time 1016   PT Stop Time 1114   PT Time Calculation (min) 58 min   Activity Tolerance Patient tolerated treatment well   Behavior During Therapy [facility] for tasks assessed/performed      Past Medical History  Diagnosis Date  . Hypertension   . CVA (cerebral vascular accident) ([place])   . Depression   . GERD (gastroesophageal reflux disease)   . Arthritis     Past Surgical History  Procedure Laterality Date  . Wisdom tooth extraction    . Hiatal hernia repair    . Cholecystectomy N/A 08/28/2015    Procedure: LAPAROSCOPIC CHOLECYSTECTOMY;  Surgeon: Fanny Skates, MD;  Location: [place];  Service: General;  Laterality: N/A;    There were no vitals filed for this visit.  Visit Diagnosis:  Abnormal posture  Chronic thoracic spine pain  Trigger point with back pain      Subjective Assessment - 10/22/15 1031    Subjective My husband had to go to the hospital in [facility] and I slept on reclinier for two nights.  the dry needling did help my sharp pain in my back.  It is better now  1/10   Pertinent History CVA , Gall bladder Sx, Hiattal hernius Sx, MRSA   Currently in Pain? Yes   Pain Score 1    Pain Location Thoracic   Pain Orientation Right   Pain Descriptors / Indicators Aching;Sore   Pain Type Chronic pain   Pain Onset More than a month ago   Pain Frequency Intermittent                          [facility] Adult PT Treatment/Exercise - 10/22/15 1034    Shoulder Exercises: Supine   Other Supine Exercises supine scap stabilization series,  with bil sith UE flex , abd ,diagonals and Bil ER with towel roll lengthwise from head to thoracic spine  for paraspinal stretch. 10 x 2    Modalities   Modalities Moist Heat   Moist Heat Therapy   Number Minutes Moist Heat 15 Minutes   Moist Heat Location Other (comment)  upper back   Manual Therapy   Joint Mobilization thoracic PA mobs grade T-2 to T-10; rib junction ribs 5,6, 7, scapular mobs in all directions   Soft tissue mobilization right scapular muscles  and upper trap   Myofascial Release Thoracic paraspinals and medial scapular           Trigger Point Dry Needling - 10/22/15 1036    Consent Given? Yes   Education Handout Provided No  previously given   Muscles Treated Upper Body Rhomboids;Subscapularis;Upper trapezius  right side only   Upper Trapezius Response Twitch reponse elicited;Palpable increased muscle length   Rhomboids Response Twitch response elicited;Palpable increased muscle length   Subscapularis Response Twitch response elicited;Palpable  increased muscle length              PT Education - 10/22/15 1030    Education provided Yes   Education Details Pt instructed in supine scapular stabilizers with red T band and lengthwise towel roll along upper spine and neck   Person(s) Educated Patient   Methods Explanation;Demonstration;Verbal cues;Tactile cues;Handout   Comprehension Verbalized understanding;Returned demonstration          PT Short Term Goals - 10/08/15 1024    PT SHORT TERM GOAL #1   Title She will be independent with inital HEP   Time 4   Period Weeks   Status New   PT SHORT TERM GOAL #2   Title She will report incidents of pain decreased 30% or more in intensity or frequency.    Time 4   Period Weeks   Status New           PT Long Term Goals -  10/08/15 1025    PT LONG TERM GOAL #1   Title She will be independent in all hEP issued as of last visit   Time 8   Period Weeks   Status New   PT LONG TERM GOAL #2   Title She will report no incidents of pain for at least 10 days   Time 8   Period Weeks   Status New   PT LONG TERM GOAL #3   Title She will report generally no tenderness in paraspinals /medial scapula on RT    Time 8   Period Weeks   Status New               Plan - 10/22/15 1219    Clinical Impression Statement [name] states she was unable to perform exericses due to husband in hospital since last week in [place].  She does have reduced pain since last visit to a 1/10 but has not been able to perform exercises as needed.   Will continue to  progress exericsees as able . Pt given scapular stabilization exercise in supine with red t band.  Pt  with kyphosis     PT Next Visit Plan Manual , modalities PRN, dry needling , progress to sitting diagnonal PNF as needed for total body stretch   PT Home Exercise Plan supine scapular stabilizers   Consulted and Agree with Plan of Care Patient        Problem List Patient Active Problem List   Diagnosis Date Noted  . 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 CVA (cerebral vascular accident) [facility])     [name], PT 10/22/2015 12:25 PM Phone: [phone] Fax: [place] [facility] [address] [place], [place], [address] Phone: [phone]   Fax:  [phone]  Name: [name] MRN: [id number] Date of Birth: 1941-09-14

## 2015-10-24 ENCOUNTER — Ambulatory Visit: Payer: Medicare Other | Admitting: Physical Therapy

## 2015-10-24 DIAGNOSIS — R293 Abnormal posture: Secondary | ICD-10-CM

## 2015-10-24 DIAGNOSIS — M549 Dorsalgia, unspecified: Secondary | ICD-10-CM

## 2015-10-24 DIAGNOSIS — G8929 Other chronic pain: Secondary | ICD-10-CM | POA: Diagnosis not present

## 2015-10-24 DIAGNOSIS — M546 Pain in thoracic spine: Secondary | ICD-10-CM | POA: Diagnosis not present

## 2015-10-24 DIAGNOSIS — M62830 Muscle spasm of back: Secondary | ICD-10-CM

## 2015-10-24 DIAGNOSIS — M25512 Pain in left shoulder: Secondary | ICD-10-CM | POA: Diagnosis not present

## 2015-10-24 NOTE — Therapy (Signed)
[place], [place], [address] Phone: [phone]   Fax:  [phone]  Physical Therapy Treatment  Patient Details  Name: [name] MRN: [id number] Date of Birth: [birth year] Referring Provider: Mertha Finders MD  Encounter Date: 10/24/2015      PT End of Session - 10/24/15 1159    Visit Number 4   Number of Visits 16   Date for PT Re-Evaluation 12/03/15   Authorization Type [employer]    Authorization Time Period KX at visit 15   Authorization - Visit Number 4   Authorization - Number of Visits 16   PT Start Time 1017   PT Stop Time 1113   PT Time Calculation (min) 56 min   Activity Tolerance Patient tolerated treatment well;Patient limited by pain  limited by pain in left arm RTC   Behavior During Therapy [facility] for tasks assessed/performed      Past Medical History  Diagnosis Date  . Hypertension   . CVA (cerebral vascular accident) ([place])   . Depression   . GERD (gastroesophageal reflux disease)   . Arthritis     Past Surgical History  Procedure Laterality Date  . Wisdom tooth extraction    . Hiatal hernia repair    . Cholecystectomy N/A 08/28/2015    Procedure: LAPAROSCOPIC CHOLECYSTECTOMY;  Surgeon: Fanny Skates, MD;  Location: [place];  Service: General;  Laterality: N/A;    There were no vitals filed for this visit.  Visit Diagnosis:  Abnormal posture  Chronic thoracic spine pain  Trigger point with back pain  Left shoulder pain  Muscle spasm of back      Subjective Assessment - 10/24/15 1021    Subjective I wasn't sore last time but now I am.  I am having pain now in left shoulder. I needed to take a pain pilll   Pertinent History CVA , Gall bladder Sx, Hiattal hernius Sx, MRSA   Currently in Pain? Yes   Pain Score 5    Pain Location Thoracic   Pain Orientation Right   Pain Descriptors / Indicators Aching;Sore   Pain Type Chronic pain   Pain Onset More than a  month ago   Pain Frequency Intermittent   Multiple Pain Sites Yes   Pain Score 3   Pain Location Shoulder   Pain Orientation Left   Pain Descriptors / Indicators Aching;Throbbing   Pain Type Chronic pain   Pain Onset More than a month ago   Pain Frequency Intermittent   Aggravating Factors  sleeping on left side and reaching for objects   Pain Relieving Factors medicine,            [facility] PT Assessment - 10/24/15 1024    AROM   Right Shoulder Flexion 160 Degrees   Right Shoulder ABduction 156 Degrees   Right Shoulder Internal Rotation 34 Degrees   Right Shoulder External Rotation 90 Degrees   Left Shoulder Flexion 150 Degrees  pain today   Left Shoulder ABduction 135 Degrees  pain through out range   Left Shoulder Internal Rotation 30 Degrees   Left Shoulder External Rotation 90 Degrees   Strength   Overall Strength Comments Pt with decreased left abd and flex and ER due to pain in left RTC but at least 4-/5                     [facility] Adult PT Treatment/Exercise - 10/24/15 1100    Shoulder Exercises: Standing  External Rotation 10 reps;Both;Strengthening  pain with left ER   Extension Strengthening;Both;10 reps   Theraband Level (Shoulder Extension) Level 2 (Red)   Row Both;AAROM;10 reps   Theraband Level (Shoulder Row) Level 2 (Red)   Modalities   Modalities Moist Heat;Iontophoresis   Moist Heat Therapy   Number Minutes Moist Heat 15 Minutes   Moist Heat Location Other (comment);Shoulder  upper back/ left shoulder   Iontophoresis   Type of Iontophoresis Dexamethasone   Location Left RTC lateral shold inf to acromion   Dose 1cc patch   Time 3-4 hours and then remove . pt given pt infor   Manual Therapy   Joint Mobilization left shoulder inf glice/posterior for RTC grade 2 due to pain   Soft tissue mobilization left scapular mx bil upper trap and levator          Trigger Point Dry Needling - 10/24/15 1154    Consent Given? Yes   Education  Handout Provided No  previously given   Muscles Treated Upper Body Supraspinatus;Upper trapezius;Levator scapulae   Upper Trapezius Response Twitch reponse elicited;Palpable increased muscle length  bil   Levator Scapulae Response Twitch response elicited;Palpable increased muscle length  bil   Supraspinatus Response Twitch response elicited;Palpable increased muscle length  left only              PT Education - 10/24/15 1048    Education provided Yes   Education Details scapular attached bil ER, bil rows and bil extension.  information of iontophoresis and removal   Person(s) Educated Patient   Methods Demonstration;Explanation;Tactile cues;Verbal cues;Handout   Comprehension Verbalized understanding;Returned demonstration          PT Short Term Goals - 10/24/15 1207    PT SHORT TERM GOAL #1   Title She will be independent with inital HEP   Time 4   Period Weeks   Status On-going   PT SHORT TERM GOAL #2   Title She will report incidents of pain decreased 30% or more in intensity or frequency.    Time 4   Period Weeks   Status On-going           PT Long Term Goals - 10/08/15 1025    PT LONG TERM GOAL #1   Title She will be independent in all hEP issued as of last visit   Time 8   Period Weeks   Status New   PT LONG TERM GOAL #2   Title She will report no incidents of pain for at least 10 days   Time 8   Period Weeks   Status New   PT LONG TERM GOAL #3   Title She will report generally no tenderness in paraspinals /medial scapula on RT    Time 8   Period Weeks   Status New               Plan - 10/24/15 1200    Clinical Impression Statement [name] reports more pain today than previous appt.  Now doing exercise but also has new complaint of Left shoulder pain with specific pain at supraspinatus insertion and compatible with RTC impingement and tendonitis.  Pt with improved pain in Thoracic region.   Given Iontophoresis patch for  RTC acute  pain.  Pt closely monitored during Trigger point dry needling.  AROM of Shoulder R/L  flex 160/150, abd, 156/135,  IR 34/30, ER 90/90 . Pain with abd resistance and ER resistance on Left.  Will continue to monitor  Pt will benefit from skilled therapeutic intervention in order to improve on the following deficits Pain;Postural dysfunction;Decreased range of motion   PT Frequency 2x / week   PT Duration 8 weeks   PT Treatment/Interventions Cryotherapy;Electrical Stimulation;Iontophoresis 4mg /ml Dexamethasone;Moist Heat;Ultrasound;Therapeutic exercise;Patient/family education;Taping;Manual techniques;Dry needling;Passive range of motion   PT Next Visit Plan Manual , modalities PRN, dry needling , progress to sitting diagnonal PNF as needed for total body stretch Assess ionto phoresis benefit and left arm pain   PT Home Exercise Plan REview HEP and ionto phoreisis /dry needling benefit   Consulted and Agree with Plan of Care Patient        Problem List Patient Active Problem List   Diagnosis Date Noted  . 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 CVA (cerebral vascular accident) [facility])     [name], PT 10/24/2015 12:10 PM Phone: [phone] Fax: [place] [facility] [address] [place], [place], [address] Phone: [phone]   Fax:  [phone]  Name: [name] MRN: [id number] Date of Birth: [birth year]

## 2015-10-24 NOTE — Patient Instructions (Addendum)
EXTENSION: Standing - Resistance Band: Stable (Active)   Stand, right arm at side. Against yellow resistance band, draw arm backward, as far as possible, keeping elbow straight. Complete 2___ sets of _10__ repetitions. Perform 7___ sessions per day.  Copyright  VHI. All rights reserved.  Row: Mid-Range - Standing   Copyright  VHI. All rights reserved.  Resistive Band Rowing   With resistive band anchored in door, grasp both ends. Keeping elbows bent, pull back, squeezing shoulder blades together. Hold __3-5__ seconds. Repeat __10 x2__ times. Do __1-2__ sessions per day.  http://gt2.exer.us/97   Copyright  VHI. All rights reserved.  External Rotation (Eccentric), (Resistance Band)    Quickly pull band outward with forearm of affected arm. Keep elbows at sides, wrists neutral. Slowly return to start for 3-5 seconds. Use __red______ resistance band. Hint: Use towel roll between elbow and hip. 10___ reps per set, _1-2__ sets per day, 7___ days per week.  http://ecce.exer.us/197   IONTOPHORESIS PATIENT PRECAUTIONS & CONTRAINDICATIONS:  . Redness under one or both electrodes can occur.  This characterized by a uniform redness that usually disappears within 12 hours of treatment. . Small pinhead size blisters may result in response to the drug.  Contact your physician if the problem persists more than 24 hours. . On rare occasions, iontophoresis therapy can result in temporary skin reactions such as rash, inflammation, irritation or burns.  The skin reactions may be the result of individual sensitivity to the ionic solution used, the condition of the skin at the start of treatment, reaction to the materials in the electrodes, allergies or sensitivity to dexamethasone, or a poor connection between the patch and your skin.  Discontinue using iontophoresis if you have any of these reactions and report to your therapist. . Remove the Patch or electrodes if you have any undue sensation of  pain or burning during the treatment and report discomfort to your therapist. . Tell your Therapist if you have had known adverse reactions to the application of electrical current. . If using the Patch, the LED light will turn off when treatment is complete and the patch can be removed.  Approximate treatment time is 1-3 hours.  Remove the patch when light goes off or after 6 hours. . The Patch can be worn during normal activity, however excessive motion where the electrodes have been placed can cause poor contact between the skin and the electrode or uneven electrical current resulting in greater risk of skin irritation. Marland Kitchen Keep out of the reach of children.   . DO NOT use if you have a cardiac pacemaker or any other electrically sensitive implanted device. . DO NOT use if you have a known sensitivity to dexamethasone. . DO NOT use during Magnetic Resonance Imaging (MRI). . DO NOT use over broken or compromised skin (e.g. sunburn, cuts, or acne) due to the increased risk of skin reaction. . DO NOT SHAVE over the area to be treated:  To establish good contact between the Patch and the skin, excessive hair may be clipped. . DO NOT place the Patch or electrodes on or over your eyes, directly over your heart, or brain. . DO NOT reuse the Patch or electrodes as this may cause burns to occur.  IONTOPHORESIS PATIENT PRECAUTIONS & CONTRAINDICATIONS:  . Redness under one or both electrodes can occur.  This characterized by a uniform redness that usually disappears within 12 hours of treatment. . Small pinhead size blisters may result in response to the drug.  Contact your physician  if the problem persists more than 24 hours. . On rare occasions, iontophoresis therapy can result in temporary skin reactions such as rash, inflammation, irritation or burns.  The skin reactions may be the result of individual sensitivity to the ionic solution used, the condition of the skin at the start of treatment,  reaction to the materials in the electrodes, allergies or sensitivity to dexamethasone, or a poor connection between the patch and your skin.  Discontinue using iontophoresis if you have any of these reactions and report to your therapist. . Remove the Patch or electrodes if you have any undue sensation of pain or burning during the treatment and report discomfort to your therapist. . Tell your Therapist if you have had known adverse reactions to the application of electrical current. . If using the Patch, the LED light will turn off when treatment is complete and the patch can be removed.  Approximate treatment time is 1-3 hours.  Remove the patch when light goes off or after 6 hours. . The Patch can be worn during normal activity, however excessive motion where the electrodes have been placed can cause poor contact between the skin and the electrode or uneven electrical current resulting in greater risk of skin irritation. Marland Kitchen Keep out of the reach of children.   . DO NOT use if you have a cardiac pacemaker or any other electrically sensitive implanted device. . DO NOT use if you have a known sensitivity to dexamethasone. . DO NOT use during Magnetic Resonance Imaging (MRI). . DO NOT use over broken or compromised skin (e.g. sunburn, cuts, or acne) due to the increased risk of skin reaction. . DO NOT SHAVE over the area to be treated:  To establish good contact between the Patch and the skin, excessive hair may be clipped. . DO NOT place the Patch or electrodes on or over your eyes, directly over your heart, or brain. . DO NOT reuse the Patch or electrodes as this may cause burns to occur.  Copyright  VHI. All rights reserved.  [name], PT 10/24/2015 10:36 AM Phone: [phone] Fax: [phone]

## 2015-10-29 ENCOUNTER — Ambulatory Visit: Payer: Medicare Other | Admitting: Physical Therapy

## 2015-10-29 DIAGNOSIS — M546 Pain in thoracic spine: Secondary | ICD-10-CM | POA: Diagnosis not present

## 2015-10-29 DIAGNOSIS — M549 Dorsalgia, unspecified: Secondary | ICD-10-CM | POA: Diagnosis not present

## 2015-10-29 DIAGNOSIS — R293 Abnormal posture: Secondary | ICD-10-CM | POA: Diagnosis not present

## 2015-10-29 DIAGNOSIS — M25512 Pain in left shoulder: Secondary | ICD-10-CM | POA: Diagnosis not present

## 2015-10-29 DIAGNOSIS — G8929 Other chronic pain: Secondary | ICD-10-CM

## 2015-10-29 DIAGNOSIS — M62830 Muscle spasm of back: Secondary | ICD-10-CM

## 2015-10-29 NOTE — Patient Instructions (Signed)
Over Head Pull: Narrow Grip   all exericise thumb up and out  May also use towel roll lengthwise on back     On back, knees bent, feet flat, band across thighs, elbows straight but relaxed. Pull hands apart (start). Keeping elbows straight, bring arms up and over head, hands toward floor. Keep pull steady on band. Hold momentarily. Return slowly, keeping pull steady, back to start. Repeat __10_ times. Band color _red_____   Side Pull: Double Arm   On back, knees bent, feet flat. Arms perpendicular to body, shoulder level, elbows straight but relaxed. Pull arms out to sides, elbows straight. Resistance band comes across collarbones, hands toward floor. Hold momentarily. Slowly return to starting position. Repeat 10___ times. Band color _red____   Elmer Picker   On back, knees bent, feet flat, left hand on left hip, right hand above left. Pull right arm DIAGONALLY (hip to shoulder) across chest. Bring right arm along head toward floor. Hold momentarily. Slowly return to starting position. Repeat _10__ times. Do with left arm. Band color _red_____   Shoulder Rotation: Double Arm   On back, knees bent, feet flat, elbows tucked at sides, bent 90, hands palms up. Pull hands apart and down toward floor, keeping elbows near sides. Hold momentarily. Slowly return to starting position. Repeat _10__ times. Band color _red_____    [name], PT 10/29/2015 10:40 AM Phone: [phone] Fax: [phone]

## 2015-10-29 NOTE — Therapy (Signed)
[place], [place], [address] Phone: [phone]   Fax:  [phone]  Physical Therapy Treatment  Patient Details  Name: [name] MRN: [id number] Date of Birth: January 22, 1941 Referring Provider: Mertha Finders MD  Encounter Date: 10/29/2015      PT End of Session - 10/29/15 1015    Visit Number 5   Number of Visits 16   Date for PT Re-Evaluation 12/03/15   Authorization Type [employer]    Authorization Time Period KX at visit 15   Authorization - Visit Number 4   Authorization - Number of Visits 16   PT Start Time [id number]   PT Stop Time 1100   PT Time Calculation (min) 45 min   Activity Tolerance Patient tolerated treatment well   Behavior During Therapy [facility] for tasks assessed/performed      Past Medical History  Diagnosis Date  . Hypertension   . CVA (cerebral vascular accident) ([place])   . Depression   . GERD (gastroesophageal reflux disease)   . Arthritis     Past Surgical History  Procedure Laterality Date  . Wisdom tooth extraction    . Hiatal hernia repair    . Cholecystectomy N/A 08/28/2015    Procedure: LAPAROSCOPIC CHOLECYSTECTOMY;  Surgeon: Fanny Skates, MD;  Location: [place];  Service: General;  Laterality: N/A;    There were no vitals filed for this visit.  Visit Diagnosis:  Abnormal posture  Chronic thoracic spine pain  Trigger point with back pain  Left shoulder pain  Muscle spasm of back      Subjective Assessment - 10/29/15 1019    Subjective I am really stiff this morning which is not unusual.  that patch really helped.  I have done more house work week than in the last 6 months. Able to sleep on my left side better.   Pertinent History CVA , Gall bladder Sx, Hiattal hernius Sx, MRSA   How long can you sit comfortably? As need without pain.    Diagnostic tests MRI Thoracic spine: DJD    Patient Stated Goals To get it to stop hurting   Currently in Pain? Yes    Pain Score 2    Pain Location Thoracic   Pain Orientation Right   Pain Descriptors / Indicators Aching;Sore   Pain Type Chronic pain   Pain Onset More than a month ago   Pain Frequency Intermittent   Pain Score 2   Pain Location Shoulder   Pain Orientation Left   Pain Descriptors / Indicators Aching;Throbbing   Pain Type Chronic pain   Pain Onset More than a month ago   Pain Frequency Intermittent                         [facility] Adult PT Treatment/Exercise - 10/29/15 1027    Lumbar Exercises: Supine   Other Supine Lumbar Exercises Thoracic stretches with towel roll    Shoulder Exercises: Supine   Other Supine Exercises supine scap stabilization series,  with bil sith UE flex , abd ,diagonals and Bil ER with towel roll lengthwise from head to thoracic spine  for paraspinal stretch. 10 x 2    Shoulder Exercises: Seated   Other Seated Exercises UE siagonals chopping and lifting 10 times each side   Modalities   Modalities --   Moist Heat Therapy   Number Minutes Moist Heat --   Iontophoresis   Type of Iontophoresis Dexamethasone  Location LEft RTC shoulder inf to acromion.   Dose 1cc patch    Time removal of patch in 4 hours      Seated trunk rotation bil x 10, chopping and lifting while seated x 10 ,  PNF assist by PT.     Trigger Point Dry Needling - 10/29/15 1031    Consent Given? --   Education Handout Provided --   Muscles Treated Upper Body --              PT Education - 10/29/15 1044    Education provided Yes   Education Details supine scapular stabilization exercises with red t band.  Importance of movement to synovial joints   Person(s) Educated Patient   Methods Explanation;Demonstration;Tactile cues;Verbal cues;Handout   Comprehension Verbalized understanding;Returned demonstration          PT Short Term Goals - 10/24/15 1207    PT SHORT TERM GOAL #1   Title She will be independent with inital HEP   Time 4   Period Weeks    Status On-going   PT SHORT TERM GOAL #2   Title She will report incidents of pain decreased 30% or more in intensity or frequency.    Time 4   Period Weeks   Status On-going           PT Long Term Goals - 10/08/15 1025    PT LONG TERM GOAL #1   Title She will be independent in all hEP issued as of last visit   Time 8   Period Weeks   Status New   PT LONG TERM GOAL #2   Title She will report no incidents of pain for at least 10 days   Time 8   Period Weeks   Status New   PT LONG TERM GOAL #3   Title She will report generally no tenderness in paraspinals /medial scapula on RT    Time 8   Period Weeks   Status New               Plan - 10/29/15 1047    Clinical Impression Statement Pt with decreased pain in left shoulder after using iontophoresis patch. Pt with thoracic and shoulder pain 2/10.  Pt given supine scapulara stabilization due to not being able to comply with standing exericse for flex and and bil rows.  Pt more easiliy was able to complete exericses in supine with red t band   Pt will benefit from skilled therapeutic intervention in order to improve on the following deficits Pain;Postural dysfunction;Decreased range of motion   Rehab Potential Good   PT Frequency 2x / week   PT Duration 8 weeks   PT Treatment/Interventions Cryotherapy;Electrical Stimulation;Iontophoresis 4mg /ml Dexamethasone;Moist Heat;Ultrasound;Therapeutic exercise;Patient/family education;Taping;Manual techniques;Dry needling;Passive range of motion   PT Next Visit Plan review HEP and assess ionto and need for TDN   PT Home Exercise Plan supine scap stabilizers   Consulted and Agree with Plan of Care Patient        Problem List Patient Active Problem List   Diagnosis Date Noted  . 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 CVA (cerebral vascular accident) [facility])     [name], PT 10/29/2015 10:57 AM Phone: [phone] Fax:  [place] [facility] [address] [place], [place], [address] Phone: [phone]   Fax:  [phone]  Name: [name] MRN: [id number] Date of Birth: August 25, 1941

## 2015-10-31 ENCOUNTER — Ambulatory Visit: Payer: Medicare Other | Admitting: Physical Therapy

## 2015-10-31 DIAGNOSIS — M25512 Pain in left shoulder: Secondary | ICD-10-CM

## 2015-10-31 DIAGNOSIS — M62830 Muscle spasm of back: Secondary | ICD-10-CM

## 2015-10-31 DIAGNOSIS — M549 Dorsalgia, unspecified: Secondary | ICD-10-CM

## 2015-10-31 DIAGNOSIS — R293 Abnormal posture: Secondary | ICD-10-CM | POA: Diagnosis not present

## 2015-10-31 DIAGNOSIS — M546 Pain in thoracic spine: Secondary | ICD-10-CM

## 2015-10-31 DIAGNOSIS — G8929 Other chronic pain: Secondary | ICD-10-CM

## 2015-11-01 NOTE — Therapy (Signed)
[place], [place], [address] Phone: [phone]   Fax:  [phone]  Physical Therapy Treatment  Patient Details  Name: [name] MRN: [id number] Date of Birth: September 05, 1941 Referring Provider: Mertha Finders MD  Encounter Date: 10/31/2015      PT End of Session - 10/31/15 1016    Visit Number 6   Number of Visits 16   Date for PT Re-Evaluation 12/03/15   Authorization Type [employer]    Authorization Time Period KX at visit 15   Authorization - Visit Number 4   PT Start Time 1016   PT Stop Time 1114   PT Time Calculation (min) 58 min   Activity Tolerance Patient tolerated treatment well   Behavior During Therapy [facility] for tasks assessed/performed      Past Medical History  Diagnosis Date  . Hypertension   . CVA (cerebral vascular accident) ([place])   . Depression   . GERD (gastroesophageal reflux disease)   . Arthritis     Past Surgical History  Procedure Laterality Date  . Wisdom tooth extraction    . Hiatal hernia repair    . Cholecystectomy N/A 08/28/2015    Procedure: LAPAROSCOPIC CHOLECYSTECTOMY;  Surgeon: Fanny Skates, MD;  Location: [place];  Service: General;  Laterality: N/A;    There were no vitals filed for this visit.  Visit Diagnosis:  Abnormal posture  Chronic thoracic spine pain  Trigger point with back pain  Left shoulder pain  Muscle spasm of back      Subjective Assessment - 10/31/15 1019    Subjective The [place] patch did not help as much this time as the first time.  I am a little sore from the exercise   Pertinent History CVA , Gall bladder Sx, Hiattal hernius Sx, MRSA   How long can you sit comfortably? As need without pain.    Diagnostic tests MRI Thoracic spine: DJD    Patient Stated Goals To get it to stop hurting   Currently in Pain? No/denies   Pain Score 2   Pain Location Shoulder   Pain Orientation Left   Pain Descriptors / Indicators  Aching;Throbbing   Pain Type Chronic pain   Pain Onset More than a month ago   Pain Frequency Intermittent   Aggravating Factors  reaching for objects and sleeping on left side                         [facility] Adult PT Treatment/Exercise - 10/31/15 1027    Shoulder Exercises: Supine   Other Supine Exercises supine scap stabilization series,  with bil sith UE flex , abd ,diagonals and Bil ER with towel roll lengthwise from head to thoracic spine  for paraspinal stretch. 10 x 2    Other Supine Exercises Thoracic paraspinal openings with lengthwise towel and supine scapular abduction x 10   Modalities   Modalities Moist Heat   Moist Heat Therapy   Number Minutes Moist Heat 15 Minutes   Moist Heat Location Shoulder  bil and upper back   Manual Therapy   Soft tissue mobilization neural glides for medial/radial nerves x 10/ soft tissue of bil upper trap and periscapular mx   Myofascial Release bil latissimus/ teres major and subscapularis bil    Scapular Mobilization bil grade 3 to scapular in all planes          Trigger Point Dry Needling - 10/31/15 1032    Consent  Given? Yes   Education Handout Provided No  previously given   Muscles Treated Upper Body Supraspinatus;Subscapularis;Upper trapezius;Levator scapulae  latissimus bil/ teres minor    Upper Trapezius Response Palpable increased muscle length  all trigger point dry needling bil   Levator Scapulae Response Palpable increased muscle length   Rhomboids Response Twitch response elicited   Supraspinatus Response Palpable increased muscle length   Subscapularis Response Twitch response elicited;Palpable increased muscle length              PT Education - 10/31/15 1058    Education provided Yes   Education Details reviewed supine scapular exericseis   Person(s) Educated Patient   Methods Explanation;Demonstration;Verbal cues;Tactile cues   Comprehension Verbalized understanding;Returned demonstration           PT Short Term Goals - 10/31/15 1022    PT SHORT TERM GOAL #1   Title She will be independent with inital HEP   Time 4   Period Weeks   Status On-going   PT SHORT TERM GOAL #2   Title She will report incidents of pain decreased 30% or more in intensity or frequency.    Baseline thoracic 70 % better.  but left arm is 20% better   Time 4   Period Weeks   Status On-going           PT Long Term Goals - 10/31/15 1023    PT LONG TERM GOAL #1   Title She will be independent in all hEP issued as of last visit   Time 8   Period Weeks   Status On-going   PT LONG TERM GOAL #2   Title She will report no incidents of pain for at least 10 days   Time 8   Period Weeks   Status On-going   PT LONG TERM GOAL #3   Title She will report generally no tenderness in paraspinals /medial scapula on RT    Time 8   Period Weeks   Status On-going   PT LONG TERM GOAL #4   Title Pt will be able to sleep on left shoulder for 3 or more hours of uninterrupted sleep due to shoulder pain   Time 8   Period Weeks   Status New               Plan - 10/31/15 1053    Clinical Impression Statement Pt with bilateral soreness of shoulders left greater than right.  Pt can sleep 2 hours on left and then is awakened. Pt mostly complains of anterior pain in Left shoulder greater than right and muscle soreness from exerices. Pt complains of pain in sholders at 2/10 and denies pain in thoracic region.  Pt with medial scaplar soreness in Left greater than right   Pt will benefit from skilled therapeutic intervention in order to improve on the following deficits Pain;Postural dysfunction;Decreased range of motion   Rehab Potential Good   PT Frequency 2x / week   PT Duration 8 weeks   PT Treatment/Interventions Cryotherapy;Electrical Stimulation;Iontophoresis 4mg /ml Dexamethasone;Moist Heat;Ultrasound;Therapeutic exercise;Patient/family education;Taping;Manual techniques;Dry needling;Passive range of  motion   PT Next Visit Plan Given [employer] PRE[facility] program information to continue once course of PT complete, wall clock, review HEP for self mobs   PT Home Exercise Plan supine scap stabilizers   Consulted and Agree with Plan of Care Patient        Problem List Patient Active Problem List   Diagnosis Date Noted  . Cholelithiasis with chronic cholecystitis  without biliary obstruction 08/28/2015  . CVA (cerebral vascular accident) [facility])    [name], PT 11/01/2015 4:19 AM Phone: [phone] Fax: [place] [facility] [address] [place], [place], [address] Phone: [phone]   Fax:  [phone]  Name: [name] MRN: [id number] Date of Birth: 1941/03/01

## 2015-11-05 ENCOUNTER — Ambulatory Visit: Payer: Medicare Other | Admitting: Physical Therapy

## 2015-11-05 DIAGNOSIS — R293 Abnormal posture: Secondary | ICD-10-CM

## 2015-11-05 DIAGNOSIS — M546 Pain in thoracic spine: Secondary | ICD-10-CM

## 2015-11-05 DIAGNOSIS — M25512 Pain in left shoulder: Secondary | ICD-10-CM | POA: Diagnosis not present

## 2015-11-05 DIAGNOSIS — M549 Dorsalgia, unspecified: Secondary | ICD-10-CM | POA: Diagnosis not present

## 2015-11-05 DIAGNOSIS — G8929 Other chronic pain: Secondary | ICD-10-CM | POA: Diagnosis not present

## 2015-11-05 DIAGNOSIS — M62830 Muscle spasm of back: Secondary | ICD-10-CM

## 2015-11-05 NOTE — Patient Instructions (Signed)
Pt given handout for thoracic extension to use with towel for self thoracic mobilizations  Also to use with chair for thoracic extension  Towel roll lengthwise for thoracic paraspinal stretching with open book/closed book with UE.  Pt demonstrated exericses in clinic.  [name], PT 11/05/2015 6:25 PM Phone: [phone] Fax: [phone]

## 2015-11-05 NOTE — Therapy (Signed)
[place], [place], [address] Phone: [phone]   Fax:  [phone]  Physical Therapy Treatment  Patient Details  Name: [name] MRN: [id number] Date of Birth: 20-Oct-1941 Referring Provider: Mertha Finders MD  Encounter Date: 11/05/2015      PT End of Session - 11/05/15 1020    Visit Number 7   Number of Visits 16   Date for PT Re-Evaluation 12/03/15   Authorization Type [employer]    Authorization Time Period KX at visit 15   Authorization - Visit Number 4   Authorization - Number of Visits 16   PT Start Time 1017   PT Stop Time 1115   PT Time Calculation (min) 58 min   Activity Tolerance Patient tolerated treatment well   Behavior During Therapy [facility] for tasks assessed/performed      Past Medical History  Diagnosis Date  . Hypertension   . CVA (cerebral vascular accident) ([place])   . Depression   . GERD (gastroesophageal reflux disease)   . Arthritis     Past Surgical History  Procedure Laterality Date  . Wisdom tooth extraction    . Hiatal hernia repair    . Cholecystectomy N/A 08/28/2015    Procedure: LAPAROSCOPIC CHOLECYSTECTOMY;  Surgeon: Fanny Skates, MD;  Location: [place];  Service: General;  Laterality: N/A;    There were no vitals filed for this visit.  Visit Diagnosis:  Abnormal posture  Chronic thoracic spine pain  Trigger point with back pain  Left shoulder pain  Muscle spasm of back      Subjective Assessment - 11/05/15 1021    Subjective I think I would like to try the ionto patch.  I went  shopping and I wore my UGGS and I think I was worse because of lack of arch support   Pertinent History CVA , Gall bladder Sx, Hiattal hernius Sx, MRSA   How long can you sit comfortably? As need without pain.    Diagnostic tests MRI Thoracic spine: DJD    Currently in Pain? Yes   Pain Score 2   Pain was 10/10 2 nights ago   Pain Location Thoracic   Pain  Orientation Right   Pain Descriptors / Indicators Aching;Sore   Pain Type Chronic pain   Pain Onset More than a month ago   Pain Frequency Intermittent   Pain Score 6   Pain Location Shoulder   Pain Orientation Left   Pain Descriptors / Indicators Aching;Throbbing   Pain Type Chronic pain   Pain Onset More than a month ago   Pain Frequency Intermittent   Aggravating Factors  reaching for objects and sleeping on left side            [facility] PT Assessment - 11/05/15 1027    AROM   Left Shoulder Flexion 160 Degrees  no pain on end range   Left Shoulder ABduction 147 Degrees  no pain    Left Shoulder Internal Rotation 40 Degrees   Left Shoulder External Rotation 90 Degrees                     [facility] Adult PT Treatment/Exercise - 11/05/15 1030    Lumbar Exercises: Supine   Other Supine Lumbar Exercises Thoracic stretches with towel roll and open book   Shoulder Exercises: Supine   Other Supine Exercises Thoracic paraspinal openings with lengthwise towel and supine scapular abduction x 10   Shoulder Exercises: Standing  Other Standing Exercises palms together and fllexion on wall x 10 for deltoid engagement humeral head in fossa x 10 x 2   Other Standing Exercises thoracic extension over chair and with towel x 10 each   Modalities   Modalities Moist Heat   Moist Heat Therapy   Number Minutes Moist Heat 15 Minutes   Moist Heat Location Shoulder  upper back   Iontophoresis   Type of Iontophoresis Dexamethasone   Location LEft RTC shoulder inf to acromion.   Dose 1cc patch    Time removal of patch in 4 hours   Manual Therapy   Joint Mobilization left shoulder inf glice/posterior for RTC grade 2 due to pain   Soft tissue mobilization soft tissue work over right   Myofascial Release bil latissimus/ teres major and subscapularis bil    Scapular Mobilization bil grade 3 to scapular in all planes          Trigger Point Dry Needling - 11/05/15 1045    Consent  Given? Yes   Muscles Treated Upper Body Subscapularis;Rhomboids  T-4 to T-7 erector spinae   Rhomboids Response Twitch response elicited;Palpable increased muscle length  bil   Subscapularis Response Twitch response elicited;Palpable increased muscle length  Right side only              PT Education - 11/05/15 1055    Education provided Yes   Education Details positioning for comfort for arm at night, thoracic extension exercises    Person(s) Educated Patient   Methods Explanation;Demonstration;Verbal cues   Comprehension Verbalized understanding;Returned demonstration          PT Short Term Goals - 10/31/15 1022    PT SHORT TERM GOAL #1   Title She will be independent with inital HEP   Time 4   Period Weeks   Status On-going   PT SHORT TERM GOAL #2   Title She will report incidents of pain decreased 30% or more in intensity or frequency.    Baseline thoracic 70 % better.  but left arm is 20% better   Time 4   Period Weeks   Status On-going           PT Long Term Goals - 11/05/15 1829    PT LONG TERM GOAL #1   Title She will be independent in all hEP issued as of last visit   Time 8   Period Weeks   Status On-going   PT LONG TERM GOAL #2   Title She will report no incidents of pain for at least 10 days   Time 8   Period Weeks   Status On-going   PT LONG TERM GOAL #3   Title She will report generally no tenderness in paraspinals /medial scapula on RT    Time 8   Period Weeks   Status On-going   PT LONG TERM GOAL #4   Title Pt will be able to sleep on left shoulder for 3 or more hours of uninterrupted sleep due to shoulder pain   Time 8   Period Weeks   Status On-going               Plan - 11/05/15 1829    Clinical Impression Statement Pt with increased sorenes over thoracic paraspinals . Pt reports wearing shoes with no arch support and attributes increased symptoms to this.  Pt left arm at RTC insertions tender to touch but pt with good  AROM  R flex 160, abd 147, IR  40 and ER 90.  AROM is WNL except for ABD and IR but much improved.  Pt will  continue toward goal   PT Next Visit Plan Review Thoraci extension. Progress shoulder exericse as able and core work   PT Home Exercise Plan Thoracic extension   Consulted and Agree with Plan of Care Patient        Problem List Patient Active Problem List   Diagnosis Date Noted  . 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 CVA (cerebral vascular accident) [facility])    [name], PT 11/05/2015 6:33 PM Phone: [phone] Fax: [facility] [address] [place], [place], [address] Phone: [phone]   Fax:  [phone]  Name: [name] MRN: [id number] Date of Birth: 07-16-1941

## 2015-11-14 ENCOUNTER — Ambulatory Visit: Payer: Medicare Other | Admitting: Physical Therapy

## 2015-11-14 DIAGNOSIS — M25512 Pain in left shoulder: Secondary | ICD-10-CM

## 2015-11-14 DIAGNOSIS — R293 Abnormal posture: Secondary | ICD-10-CM | POA: Diagnosis not present

## 2015-11-14 DIAGNOSIS — M62830 Muscle spasm of back: Secondary | ICD-10-CM | POA: Diagnosis not present

## 2015-11-14 DIAGNOSIS — G8929 Other chronic pain: Secondary | ICD-10-CM | POA: Diagnosis not present

## 2015-11-14 DIAGNOSIS — M549 Dorsalgia, unspecified: Secondary | ICD-10-CM | POA: Diagnosis not present

## 2015-11-14 DIAGNOSIS — M546 Pain in thoracic spine: Secondary | ICD-10-CM | POA: Diagnosis not present

## 2015-11-14 NOTE — Therapy (Addendum)
[place], [place], [address] Phone: [phone]   Fax:  [phone]  Physical Therapy Treatment  Patient Details  Name: [name] MRN: [id number] Date of Birth: 08-13-1941 Referring Provider: Mertha Finders MD  Encounter Date: 11/14/2015      PT End of Session - 11/14/15 1653    Visit Number 8   Number of Visits 16   Date for PT Re-Evaluation 12/03/15   Authorization Type [employer]    Authorization Time Period KX at visit 15   Authorization - Visit Number 4   Authorization - Number of Visits 16   PT Start Time 1016   PT Stop Time 1114   PT Time Calculation (min) 58 min   Activity Tolerance Patient tolerated treatment well   Behavior During Therapy [facility] for tasks assessed/performed      Past Medical History  Diagnosis Date  . Hypertension   . CVA (cerebral vascular accident) ([place])   . Depression   . GERD (gastroesophageal reflux disease)   . Arthritis     Past Surgical History  Procedure Laterality Date  . Wisdom tooth extraction    . Hiatal hernia repair    . Cholecystectomy N/A 08/28/2015    Procedure: LAPAROSCOPIC CHOLECYSTECTOMY;  Surgeon: Fanny Skates, MD;  Location: [place];  Service: General;  Laterality: N/A;    There were no vitals filed for this visit.  Visit Diagnosis:  Abnormal posture  Chronic thoracic spine pain  Trigger point with back pain  Left shoulder pain  Muscle spasm of back      Subjective Assessment - 11/14/15 1018    Subjective My shoulder does not have pain today.  3 episodes at night time.  I try to stretch it out at night with pillows.  I usually have pain after I walk a lot.  Need to stretch after I walk    Pertinent History CVA , Gall bladder Sx, Hiattal hernius Sx, MRSA   How long can you sit comfortably? As need without pain.    Diagnostic tests MRI Thoracic spine: DJD    Patient Stated Goals To get it to stop hurting   Currently in  Pain? Yes   Pain Score 7    Pain Location Thoracic   Pain Orientation Right   Pain Descriptors / Indicators Aching;Sore   Pain Type Chronic pain   Pain Score 0   Pain Location Shoulder   Pain Orientation Left            [facility] PT Assessment - 11/14/15 1055    AROM   Thoracic Extension 10   Thoracic - Right Side Bend 35   Thoracic - Left Side Bend 30   Thoracic - Right Rotation 65   Thoracic - Left Rotation 75                     [facility] Adult PT Treatment/Exercise - 11/14/15 1026    Lumbar Exercises: Seated   Other Seated Lumbar Exercises total motion release with trunk rotation , arm raise and leg raise and 4 x 15 to Right and left each part   Moist Heat Therapy   Number Minutes Moist Heat 15 Minutes   Moist Heat Location Other (comment)  upper back   Manual Therapy   Soft tissue mobilization soft tissue work over right thoracic bil T-4 to T- 10   Myofascial Release iliocostalis bilaterally but concnetrating on left  Trigger Point Dry Needling - 11/14/15 1026    Consent Given? Yes   Education Handout Provided No  preciously given   Muscles Treated Upper Body Subscapularis  Thoracic paraspinal  T-4 to T-8 right only left ribs 4-5 6    Upper Trapezius Response Twitch reponse elicited;Palpable increased muscle length  left only   Subscapularis Response Twitch response elicited;Palpable increased muscle length              PT Education - 11/14/15 1030    Education provided Yes   Education Details Given Total Motion Release for trunk rotation, arm raise and leg raise.  Reviewed HEP   Person(s) Educated Patient   Methods Explanation;Demonstration;Handout;Tactile cues          PT Short Term Goals - 11/14/15 1039    PT SHORT TERM GOAL #1   Title She will be independent with inital HEP   Time 4   Period Weeks   Status Achieved   PT SHORT TERM GOAL #2   Title She will report incidents of pain decreased 30% or more in intensity or  frequency.    Baseline Arm is 95% better, Thoracic  70%   Time 4   Period Weeks   Status On-going           PT Long Term Goals - 11/14/15 1644    PT LONG TERM GOAL #1   Title She will be independent in all hEP issued as of last visit   Time 8   Period Weeks   Status On-going   PT LONG TERM GOAL #2   Title She will report no incidents of pain for at least 10 days   Time 8   Period Weeks   Status On-going   PT LONG TERM GOAL #3   Title She will report generally no tenderness in paraspinals /medial scapula on RT    Time 8   Period Weeks   Status On-going   PT LONG TERM GOAL #4   Title Pt will be able to sleep on left shoulder for 3 or more hours of uninterrupted sleep due to shoulder pain   Baseline sporadic   Time 8   Period Weeks   Status On-going               Plan - 11/14/15 1032    Clinical Impression Statement Pt with no pain on left shoulder today.  Pt with returned pain in thoracic area  left greater than right.  Pt admits she has not been doing all the exericses as she should for back.  Pt did have increased ROM in thoracici area althought pain is 7/10 .  Pt always feel better after treatment but is also aware she needs to do the exericses at home for better carry over.  Pt has 3 additional appt to reinforce HEP and then will be DC.  Pt did discuss concerns about thoracic pain and it was agree that pt may call MD for further work up if PT does not reduce pain or  if she does not have HEP that will help her to reduce pain at home   Pt will benefit from skilled therapeutic intervention in order to improve on the following deficits Pain;Postural dysfunction;Decreased range of motion   Rehab Potential Good   PT Frequency 2x / week   PT Duration 8 weeks   PT Treatment/Interventions Cryotherapy;Electrical Stimulation;Iontophoresis 4mg /ml Dexamethasone;Moist Heat;Ultrasound;Therapeutic exercise;Patient/family education;Taping;Manual techniques;Dry needling;Passive  range of motion   PT Next Visit  Plan FOTO and goals assessed/ Deep neck flexors   PT Home Exercise Plan totatl motion release  ERbil with red T band   Consulted and Agree with Plan of Care Patient        Problem List Patient Active Problem List   Diagnosis Date Noted  . 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 CVA (cerebral vascular accident) [facility])     [name], PT 11/14/2015 4:55 PM Phone: [phone] Fax: [facility] [address] [place], [place], [address] Phone: [phone]   Fax:  [phone]  Name: [name] MRN: [id number] Date of Birth: 1941-02-25

## 2015-11-14 NOTE — Patient Instructions (Signed)
Pt given Total Motion Release for trunk rotation, arm raise and leg Raise (LAQ) x 3 to Right and Left after she walks for stretch and to prevent thoracic pain.    [name], PT 11/14/2015 10:39 AM Phone: [phone] Fax: [phone]

## 2015-11-19 ENCOUNTER — Ambulatory Visit: Payer: Medicare Other | Attending: Internal Medicine | Admitting: Physical Therapy

## 2015-11-19 DIAGNOSIS — M549 Dorsalgia, unspecified: Secondary | ICD-10-CM | POA: Diagnosis not present

## 2015-11-19 DIAGNOSIS — M25512 Pain in left shoulder: Secondary | ICD-10-CM | POA: Diagnosis not present

## 2015-11-19 DIAGNOSIS — M62830 Muscle spasm of back: Secondary | ICD-10-CM

## 2015-11-19 DIAGNOSIS — R293 Abnormal posture: Secondary | ICD-10-CM | POA: Insufficient documentation

## 2015-11-19 DIAGNOSIS — M546 Pain in thoracic spine: Secondary | ICD-10-CM | POA: Insufficient documentation

## 2015-11-19 DIAGNOSIS — G8929 Other chronic pain: Secondary | ICD-10-CM | POA: Diagnosis not present

## 2015-11-19 NOTE — Therapy (Signed)
[place], [place], [address] Phone: [phone]   Fax:  [phone]  Physical Therapy Treatment  Patient Details  Name: ROSANN GORUM MRN: [id number] Date of Birth: 08/06/1941 Referring Provider: Mertha Finders MD  Encounter Date: 11/19/2015      PT End of Session - 11/19/15 1011    Visit Number 9   Number of Visits 16   Date for PT Re-Evaluation 12/03/15   Authorization Type [employer]    Authorization Time Period KX at visit 15   PT Start Time 1010   PT Stop Time 1108   PT Time Calculation (min) 58 min   Activity Tolerance Patient tolerated treatment well   Behavior During Therapy [place] for tasks assessed/performed      Past Medical History  Diagnosis Date  . Hypertension   . CVA (cerebral vascular accident) ([place])   . Depression   . GERD (gastroesophageal reflux disease)   . Arthritis     Past Surgical History  Procedure Laterality Date  . Wisdom tooth extraction    . Hiatal hernia repair    . Cholecystectomy N/A 08/28/2015    Procedure: LAPAROSCOPIC CHOLECYSTECTOMY;  Surgeon: Fanny Skates, MD;  Location: [place];  Service: General;  Laterality: N/A;    There were no vitals filed for this visit.  Visit Diagnosis:  Abnormal posture  Chronic thoracic spine pain  Trigger point with back pain  Muscle spasm of back  Left shoulder pain      Subjective Assessment - 11/19/15 1019    Subjective My shoulder is doing well but I still my right upper back still hurts.  I can't stand for longer than 45 minutres.  Pt tends to have more pain after eating and sitting at table.  Pt unable to touch floor in chair due to short stature. I shopped one day and I could not last longer than 45 minutes.     Pertinent History CVA , Gall bladder Sx, Hiattal hernius Sx, MRSA   How long can you sit comfortably? unklimited   How long can you stand comfortably? 45 minutes   How long can you walk comfortably?  45 minutes   Diagnostic tests MRI Thoracic spine: DJD    Patient Stated Goals To get it to stop hurting   Currently in Pain? Yes   Pain Score 8   at rest during day 2/10 but at night worst   Pain Location Thoracic   Pain Orientation Right   Pain Descriptors / Indicators Aching;Sore   Pain Type Chronic pain   Pain Onset More than a month ago   Pain Frequency Intermittent   Aggravating Factors  sitting at table to eat. standing for longer than 45 minutes   Pain Score 0   Pain Location Shoulder   Pain Orientation Left            [facility] PT Assessment - 11/19/15 1024    Observation/Other Assessments   Focus on Therapeutic Outcomes (FOTO)  Intake 58%  limitation 42% Predicted 42%    AROM   Right Shoulder Flexion 160 Degrees   Right Shoulder ABduction 156 Degrees   Right Shoulder Internal Rotation 34 Degrees   Right Shoulder External Rotation 90 Degrees   Left Shoulder Flexion 156 Degrees   Left Shoulder ABduction 152 Degrees   Left Shoulder Internal Rotation 42 Degrees   Left Shoulder External Rotation 90 Degrees   Thoracic Extension 12   Thoracic - Right Side Bend 35  Thoracic - Left Side Bend 32   Thoracic - Right Rotation 65   Thoracic - Left Rotation 75                     [facility] Adult PT Treatment/Exercise - 11/19/15 1024    Posture/Postural Control   Posture Comments discussed proper seating using a cushion for feet  for proper posture in sitting.  Pt to take cuss   Lumbar Exercises: Seated   Other Seated Lumbar Exercises total motion release with trunk rotation , arm raise and leg raise and 4 x 15 to Right and left each part   Shoulder Exercises: Supine   Other Supine Exercises supine scap stabilization series,  with bil sith UE flex , abd ,diagonals and Bil ER with towel roll lengthwise from head to thoracic spine  for paraspinal stretch. 10 x 2    Shoulder Exercises: Standing   External Rotation Strengthening;Both;10 reps   Extension  Strengthening;Both;10 reps   Row Strengthening;10 reps  x 2 with VC for posture   Moist Heat Therapy   Number Minutes Moist Heat 15 Minutes   Moist Heat Location Other (comment)  upper back   Manual Therapy   Soft tissue mobilization soft tissue work over right thoracic bil T-4 to T- 10   Scapular Mobilization Right scaplar mobs in all directions.  3 bouts of 90 sec hold                PT Education - 11/19/15 1005    Education provided Yes   Education Details Reviewed Total motion release and educated on standing scapular stabilizer with red t band .  sitting posture with cushion for feet for short stature and nightn time postioning.   Person(s) Educated Patient   Methods Explanation;Demonstration;Handout;Tactile cues;Verbal cues   Comprehension Verbalized understanding;Returned demonstration          PT Short Term Goals - 11/19/15 1053    PT SHORT TERM GOAL #1   Title She will be independent with inital HEP   Time 4   Period Weeks   Status Achieved   PT SHORT TERM GOAL #2   Title She will report incidents of pain decreased 30% or more in intensity or frequency.    Baseline Arm is 95% better, Thoracic 50%   Time 4   Period Weeks   Status Partially Met           PT Long Term Goals - 11/19/15 1056    PT LONG TERM GOAL #1   Title She will be independent in all hEP issued as of last visit   Time 8   Period Weeks   Status On-going   PT LONG TERM GOAL #2   Title She will report no incidents of pain for at least 10 days   Baseline intermittent  FOTO schore 42 % limitation, Predictred 42%   Time 8   Period Weeks   Status On-going   PT LONG TERM GOAL #3   Title She will report generally no tenderness in paraspinals /medial scapula on RT    Time 8   Period Weeks   Status On-going   PT LONG TERM GOAL #4   Title Pt will be able to sleep on left shoulder for 3 or more hours of uninterrupted sleep due to shoulder pain   Time 8   Period Weeks   Status Achieved  Plan - 11-21-2015 1102    Clinical Impression Statement Pt presents with no pain in left shoulder.  Only minimal with exericise.  Pt progressed with standing scapular exericises bil ext, bil rows and bil ER.    Pt states she gets release using Total motion release exericses but she often has problems  after eating and going to sleelp at night.  Pt is short in stature and does not have feet touch floor in sitting.  Pt was educated on how to use a cushion and stool in order to have proper alignmentt in sitting . Pt  with increased AROM in thoracic ext 12 , right side bend 35, left side bend 32 right rotation 65 and left rotation 75,   Pt with decreased slightly in left flex shoulde 156, abd 152 and  same  42 IR and 90 ER.  Pt  has some education on how to self manage pain at home and will be reinforced during last 2-3 visits.    Pt will benefit from skilled therapeutic intervention in order to improve on the following deficits Pain;Postural dysfunction;Decreased range of motion   Rehab Potential Good   PT Frequency 2x / week   PT Duration 8 weeks   PT Treatment/Interventions Cryotherapy;Electrical Stimulation;Iontophoresis 29m/ml Dexamethasone;Moist Heat;Ultrasound;Therapeutic exercise;Patient/family education;Taping;Manual techniques;Dry needling;Passive range of motion   PT Next Visit Plan Pt will need soft tissue/ TDN for subsequent visits until DC    PT Home Exercise Plan Total motion release and Standing scapular   Consulted and Agree with Plan of Care Patient     Pt  Note sent to MD      G-Codes - [id number]   Functional Assessment Tool Used FOTO 42% today Eval 50%   Functional Limitation Other PT subsequent  sleeping /body position   Other PT Primary Current Status ((J0964 At least 40 percent but less than 60 percent impaired, limited or restricted   Other PT Primary Goal Status ((R8381 At least 20 percent but less than 40 percent impaired, limited or restricted       Problem List Patient Active Problem List   Diagnosis Date Noted  . 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 CVA (cerebral vascular accident) ([facility]     LVoncille Lo PT 0Jan 05, 201712:26 PM Phone: [phone]: 3[facility]GMer[address] [place] [address]Phone: [phone]  Fax:  [phone] Name: BJACKALYN HAITHMRN: [id number] of Birth: [birth year]

## 2015-11-19 NOTE — Patient Instructions (Signed)
EXTENSION: Standing - Resistance Band: Stable (Active)   Stand, right arm at side. Against yellow resistance band, draw arm backward, as far as possible, keeping elbow straight. Complete __2_ sets of _10__ repetitions. Perform 1-2___ sessions per day.     Copyright  VHI. All rights reserved.  Resistive Band Rowing   With resistive band anchored in door, grasp both ends. Keeping elbows bent, pull back, squeezing shoulder blades together. Hold __3-5__ seconds. Good motor control . Nice and slow and minimal to no pain. Repeat __10 x 2 __ times. Do 1-2____ sessions per day.  http://[email or web address].exer.us/97   Copyright  VHI. All rights reserved.  [name], PT 11/19/2015 10:44 AM Phone: [phone] Fax: [phone]  WALKING  Walking is a great form of exercise to increase your strength, endurance and overall fitness.  A walking program can help you start slowly and gradually build endurance as you go.  Everyone's ability is different, so each person's starting point will be different.  You do not have to follow them exactly.  The are just samples. You should simply find out what's right for you and stick to that program.   In the beginning, you'll start off walking 2-3 times a day for short distances.  As you get stronger, you'll be walking further at just 1-2 times per day.  A. You Can Walk For A Certain Length Of Time Each Day    Walk 5 minutes 3 times per day.  Increase 2 minutes every 2 days (3 times per day).  Work up to 25-30 minutes (1-2 times per day).   Example:   Day 1-2 5 minutes 3 times per day   Day 7-8 12 minutes 2-3 times per day   Day 13-14 25 minutes 1-2 times per day  B. You Can Walk For a Certain Distance Each Day     Distance can be substituted for time.    Example:   3 trips to mailbox (at road)   3 trips to corner of block   3 trips around the block  C. Go to local [employer] and use the track.    Walk for distance ____ around track  Or time ____  minutes  D. Walk _x___ Jog ____ Run ___  Please only do the exercises that your therapist has initialed and dated  [name], PT 11/19/2015 10:45 AM Phone: [phone] Fax: [phone]

## 2015-11-21 ENCOUNTER — Ambulatory Visit: Payer: Medicare Other | Admitting: Physical Therapy

## 2015-11-21 DIAGNOSIS — M25512 Pain in left shoulder: Secondary | ICD-10-CM | POA: Diagnosis not present

## 2015-11-21 DIAGNOSIS — R293 Abnormal posture: Secondary | ICD-10-CM | POA: Diagnosis not present

## 2015-11-21 DIAGNOSIS — G8929 Other chronic pain: Secondary | ICD-10-CM

## 2015-11-21 DIAGNOSIS — M546 Pain in thoracic spine: Secondary | ICD-10-CM | POA: Diagnosis not present

## 2015-11-21 DIAGNOSIS — M62830 Muscle spasm of back: Secondary | ICD-10-CM | POA: Diagnosis not present

## 2015-11-21 DIAGNOSIS — M549 Dorsalgia, unspecified: Secondary | ICD-10-CM

## 2015-11-21 NOTE — Therapy (Signed)
[place], [place], [address] Phone: [phone]   Fax:  [phone]  Physical Therapy Treatment  Patient Details  Name: [name] MRN: [id number] Date of Birth: June 21, 1941 Referring Provider: Mertha Finders MD  Encounter Date: 11/21/2015      PT End of Session - 11/21/15 1015    Visit Number 10   Number of Visits 16   Date for PT Re-Evaluation 12/03/15   Authorization Type [employer]    Authorization Time Period KX at visit 15   Authorization - Visit Number 4   PT Start Time 1015   PT Stop Time 1108   PT Time Calculation (min) 53 min   Activity Tolerance Patient tolerated treatment well   Behavior During Therapy [facility] for tasks assessed/performed      Past Medical History  Diagnosis Date  . Hypertension   . CVA (cerebral vascular accident) ([place])   . Depression   . GERD (gastroesophageal reflux disease)   . Arthritis     Past Surgical History  Procedure Laterality Date  . Wisdom tooth extraction    . Hiatal hernia repair    . Cholecystectomy N/A 08/28/2015    Procedure: LAPAROSCOPIC CHOLECYSTECTOMY;  Surgeon: Fanny Skates, MD;  Location: [place];  Service: General;  Laterality: N/A;    There were no vitals filed for this visit.  Visit Diagnosis:  Abnormal posture  Chronic thoracic spine pain  Trigger point with back pain  Muscle spasm of back  Left shoulder pain      Subjective Assessment - 11/21/15 1016    Subjective My shoulder is sore from doing the exericses but I am not in the same pain with my shoulder.  My upper back has not hurt at all today like it did   Pertinent History CVA , Gall bladder Sx, Hiattal hernius Sx, MRSA   How long can you sit comfortably? unlimited   How long can you stand comfortably? 45 minutes   How long can you walk comfortably? 45 minutes   Diagnostic tests MRI Thoracic spine: DJD    Patient Stated Goals To get it to stop hurting   Currently  in Pain? No/denies  at rest. right upper back latent pain   Pain Score 0-No pain   Pain Location Thoracic   Pain Orientation Right   Pain Descriptors / Indicators Aching;Sore   Pain Type Chronic pain   Pain Onset More than a month ago   Pain Frequency Intermittent   Pain Score 2   Pain Location Shoulder   Pain Orientation Left   Pain Descriptors / Indicators Sore  from exericise   Pain Type Chronic pain   Pain Onset More than a month ago                         [facility] Adult PT Treatment/Exercise - 11/21/15 1020    Self-Care   Self-Care Posture;Other Self-Care Comments  sitting posture with stool    Lumbar Exercises: Seated   Other Seated Lumbar Exercises reviewed Total Motion release exerices trunk rotation and arm raise 15 sec x 4 bil for 15 sec each.   Lumbar Exercises: Supine   Other Supine Lumbar Exercises using soft foam roller open book exericise x 10 in supine an hooklying.    Moist Heat Therapy   Number Minutes Moist Heat 15 Minutes   Moist Heat Location Other (comment)  upper back   Manual Therapy   Soft  tissue mobilization soft tissue work over right thoracic bil T-4 to T- 10 wit IASTYM tool.  intercostal/ iliocostaliis  R more than left soft tissue needed   Myofascial Release iliocostalis bilaterally but concnetrating on left    Scapular Mobilization Right scaplar mobs in all directions.  6 bouts of 90 sec holdof Right scapula in left sidelying                  PT Short Term Goals - 11/19/15 1053    PT SHORT TERM GOAL #1   Title She will be independent with inital HEP   Time 4   Period Weeks   Status Achieved   PT SHORT TERM GOAL #2   Title She will report incidents of pain decreased 30% or more in intensity or frequency.    Baseline Arm is 95% better, Thoracic 50%   Time 4   Period Weeks   Status Partially Met           PT Long Term Goals - 11/19/15 1056    PT LONG TERM GOAL #1   Title She will be independent in all hEP  issued as of last visit   Time 8   Period Weeks   Status On-going   PT LONG TERM GOAL #2   Title She will report no incidents of pain for at least 10 days   Baseline intermittent  FOTO schore 42 % limitation, Predictred 42%   Time 8   Period Weeks   Status On-going   PT LONG TERM GOAL #3   Title She will report generally no tenderness in paraspinals /medial scapula on RT    Time 8   Period Weeks   Status On-going   PT LONG TERM GOAL #4   Title Pt will be able to sleep on left shoulder for 3 or more hours of uninterrupted sleep due to shoulder pain   Time 8   Period Weeks   Status Achieved               Plan - 11/21/15 1057    Clinical Impression Statement Pt presents with slight soreness but pain no pain in upper back at rest but latent pain with palpation in right upper back at rib junction 4, 5 and 6.  Pt is utilizing HEP more consistently and should be ready for DC next visit. Pt with improved AROM  from eval as shown on last visit.     PT Next Visit Plan Pt will need soft tissue/ TDN for subsequent visits until DC next vist  FOTO and DC   PT Home Exercise Plan Total motion release and Standing scapular/ use of foam roller for self mob at home   Consulted and Agree with Plan of Care Patient        Problem List Patient Active Problem List   Diagnosis Date Noted  . 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 CVA (cerebral vascular accident) [facility])    [name], PT 11/21/2015 11:00 AM Phone: [phone] Fax: [facility] [address] [place], [place], [address] Phone: [phone]   Fax:  [phone]  Name: [name] MRN: [id number] Date of Birth: Aug 30, 1941

## 2015-11-26 ENCOUNTER — Ambulatory Visit: Payer: Medicare Other | Admitting: Physical Therapy

## 2015-11-26 DIAGNOSIS — M25512 Pain in left shoulder: Secondary | ICD-10-CM

## 2015-11-26 DIAGNOSIS — M546 Pain in thoracic spine: Secondary | ICD-10-CM | POA: Diagnosis not present

## 2015-11-26 DIAGNOSIS — M62830 Muscle spasm of back: Secondary | ICD-10-CM

## 2015-11-26 DIAGNOSIS — R293 Abnormal posture: Secondary | ICD-10-CM | POA: Diagnosis not present

## 2015-11-26 DIAGNOSIS — M549 Dorsalgia, unspecified: Secondary | ICD-10-CM | POA: Diagnosis not present

## 2015-11-26 DIAGNOSIS — G8929 Other chronic pain: Secondary | ICD-10-CM

## 2015-11-26 NOTE — Therapy (Signed)
[place], [place], [address] Phone: [phone]   Fax:  [phone]  Physical Therapy Treatment/Discharge Note  Patient Details  Name: [name] MRN: [id number] Date of Birth: [birth year] Referring Provider: Mertha Finders MD  Encounter Date: 11/26/2015      PT End of Session - 11/26/15 0941    Visit Number 11   Number of Visits 16   Date for PT Re-Evaluation 12/03/15   Authorization Type [employer]    Authorization Time Period KX at visit 15   Authorization - Visit Number 4   PT Start Time 0930   PT Stop Time 1030   PT Time Calculation (min) 60 min   Activity Tolerance Patient tolerated treatment well   Behavior During Therapy [facility] for tasks assessed/performed      Past Medical History  Diagnosis Date  . Hypertension   . CVA (cerebral vascular accident) ([place])   . Depression   . GERD (gastroesophageal reflux disease)   . Arthritis     Past Surgical History  Procedure Laterality Date  . Wisdom tooth extraction    . Hiatal hernia repair    . Cholecystectomy N/A 08/28/2015    Procedure: LAPAROSCOPIC CHOLECYSTECTOMY;  Surgeon: Fanny Skates, MD;  Location: [place];  Service: General;  Laterality: N/A;    There were no vitals filed for this visit.  Visit Diagnosis:  Abnormal posture  Chronic thoracic spine pain  Trigger point with back pain  Muscle spasm of back  Left shoulder pain      Subjective Assessment - 11/26/15 1218    Subjective I am feeling much better.  I tried to stay in and not get out in the snow.  I think I am holding my 75 year old granddaughter wrong and it is making my pain worse so I stopped than  . Now I feel fine   Pertinent History CVA , Gall bladder Sx, Hiattal hernius Sx, MRSA   How long can you sit comfortably? unlimited   How long can you stand comfortably? 45 minutes   How long can you walk comfortably? 45 minutes   Diagnostic tests MRI Thoracic spine:  DJD    Patient Stated Goals To get it to stop hurting   Currently in Pain? No/denies  Occassionally     Pain Orientation Right   Pain Score 0   Pain Location Shoulder   Pain Orientation Left            [facility] PT Assessment - 11/26/15 0947    AROM   Right Shoulder Flexion 160 Degrees   Right Shoulder ABduction 160 Degrees   Right Shoulder Internal Rotation 34 Degrees   Right Shoulder External Rotation 90 Degrees   Left Shoulder Flexion 156 Degrees  160   Left Shoulder ABduction 154 Degrees   Left Shoulder Internal Rotation 42 Degrees   Left Shoulder External Rotation 90 Degrees   Thoracic Extension 12   Thoracic - Right Side Bend 35   Thoracic - Left Side Bend 32   Thoracic - Right Rotation 65   Thoracic - Left Rotation 75   Palpation   Palpation comment Pt mildly tender to palpation on Right thoracic paraspinals to palpation Right greater than Left but able to alleviate with soft tissue work                     [facility] Adult PT Treatment/Exercise - 11/26/15 0947    Shoulder Exercises: Standing  Extension Strengthening;Both;10 reps;Theraband   Theraband Level (Shoulder Extension) Level 3 (Green)   Row Strengthening;10 reps;Theraband  x 2 with VC for posture   Theraband Level (Shoulder Row) Level 3 (Green)   Moist Heat Therapy   Number Minutes Moist Heat 15 Minutes   Moist Heat Location Other (comment)  upper back   Manual Therapy   Soft tissue mobilization soft tissue work over right thoracic bil T-4 to T- 10 wit IASTYM tool.  intercostal/ iliocostaliis  R more than left soft tissue needed   Myofascial Release iliocostalis bilaterally but concnetrating on left    Scapular Mobilization Right scaplar mobs in all directions.  6 bouts of 90 sec holdof Right scapula in left sidelying     Reviewed HEP and introduced self myofascial release with tennis ball on wall for home use            PT Education - 11/26/15 1013    Education provided Yes    Education Details Reviewed HEP and gave community wellness information    Person(s) Educated Patient   Methods Explanation;Verbal cues;Demonstration   Comprehension Verbalized understanding;Returned demonstration          PT Short Term Goals - 11/26/15 0942    PT SHORT TERM GOAL #1   Title She will be independent with inital HEP   Time 4   Period Weeks   Status Achieved   PT SHORT TERM GOAL #2   Title She will report incidents of pain decreased 30% or more in intensity or frequency.    Baseline ARm 95%, Thoracic 90% today , have avoided stressing/picking up grandchild   Time 4   Period Weeks   Status Achieved           PT Long Term Goals - 11/26/15 4742    PT LONG TERM GOAL #1   Title She will be independent in all hEP issued as of last visit   Time 8   Period Weeks   Status Achieved   PT LONG TERM GOAL #2   Title She will report no incidents of pain for at least 10 days   Baseline Pt with discomfort in upper back at times but today 0/10 pain  intermittent. FOTO scored 35% limitation, predicted 42%   Time 8   Period Weeks   Status Achieved   PT LONG TERM GOAL #3   Title She will report generally no tenderness in paraspinals /medial scapula on RT    Time 8   Period Weeks   Status Partially Met   PT LONG TERM GOAL #4   Title Pt will be able to sleep on left shoulder for 3 or more hours of uninterrupted sleep due to shoulder pain   Time 8   Period Weeks   Status Achieved               Plan - 11/26/15 1221    Clinical Impression Statement Pt able to achieve all LTG's and FOTO is 35% from predicted 42% limitation.  Pt is pleased with current progress and will continue with massage therapy as needed and continue using HEP for continue posterior chain exericise.  Pt was  encouraged to continue at [facility] or [place] program for core strengthening Now that acute pain epeisode is alleviated.   AROM of UE's and thoracic mobility have increased since evaluation.  See Assessment.   PT Next Visit Plan DC due to achieved goals    Consulted and Agree with Plan of Care Patient  Pain exacerbates occasionally but recently at 1/10 to 3/10 depending on actiivity      G-Codes - Dec 02, 2015 1225    Functional Assessment Tool Used FOTO 35%   Functional Limitation Other PT subsequent  sleeping body postion   Other PT Secondary Goal Status (J1552) At least 20 percent but less than 40 percent impaired, limited or restricted   Other PT Secondary Discharge Status (C8022) At least 20 percent but less than 40 percent impaired, limited or restricted      Problem List Patient Active Problem List   Diagnosis Date Noted  . 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 CVA (cerebral vascular accident) [facility])    [name], PT Dec 02, 2015 12:30 PM Phone: [phone] Fax: [place] [facility] [address] [place], [place], [address] Phone: [phone]   Fax:  [phone]  Name: LINCY BELLES MRN: [id number] Date of Birth: Oct 23, 1941  PHYSICAL THERAPY DISCHARGE SUMMARY  Visits from Start of Care: 11  Current functional level related to goals / functional outcomes: See above   Remaining deficits: Occasional tenderness over thoracic paraspinals due to posture/ kyphosis and posterior chain weakness.     Education / Equipment: HEP and Administrator  Plan: Patient agrees to discharge.  Patient goals were not met. Patient is being discharged due to meeting the stated rehab goals.  ?????    And able to perform HEP at home and to pursue community wellness programs for continued strengthening.    [name], PT 12/02/15 12:33 PM Phone: [phone] Fax: [phone]

## 2015-11-26 NOTE — Patient Instructions (Signed)
REviewed HEP from previous sessions.  Only added self myofascial release using tennis ball and shown in clinic.  [name], PT 11/26/2015 12:20 PM Phone: [phone] Fax: [phone]

## 2015-12-06 DIAGNOSIS — Z0001 Encounter for general adult medical examination with abnormal findings: Secondary | ICD-10-CM | POA: Diagnosis not present

## 2015-12-06 DIAGNOSIS — K219 Gastro-esophageal reflux disease without esophagitis: Secondary | ICD-10-CM | POA: Diagnosis not present

## 2015-12-06 DIAGNOSIS — M5430 Sciatica, unspecified side: Secondary | ICD-10-CM | POA: Diagnosis not present

## 2015-12-06 DIAGNOSIS — I10 Essential (primary) hypertension: Secondary | ICD-10-CM | POA: Diagnosis not present

## 2015-12-06 DIAGNOSIS — R413 Other amnesia: Secondary | ICD-10-CM | POA: Diagnosis not present

## 2015-12-06 DIAGNOSIS — R197 Diarrhea, unspecified: Secondary | ICD-10-CM | POA: Diagnosis not present

## 2015-12-06 DIAGNOSIS — D81818 Other biotin-dependent carboxylase deficiency: Secondary | ICD-10-CM | POA: Diagnosis not present

## 2015-12-06 DIAGNOSIS — Z79899 Other long term (current) drug therapy: Secondary | ICD-10-CM | POA: Diagnosis not present

## 2015-12-06 DIAGNOSIS — E559 Vitamin D deficiency, unspecified: Secondary | ICD-10-CM | POA: Diagnosis not present

## 2015-12-06 DIAGNOSIS — M791 Myalgia: Secondary | ICD-10-CM | POA: Diagnosis not present

## 2016-02-25 DIAGNOSIS — Z6831 Body mass index (BMI) 31.0-31.9, adult: Secondary | ICD-10-CM | POA: Diagnosis not present

## 2016-02-25 DIAGNOSIS — N760 Acute vaginitis: Secondary | ICD-10-CM | POA: Diagnosis not present

## 2016-02-25 DIAGNOSIS — Z01419 Encounter for gynecological examination (general) (routine) without abnormal findings: Secondary | ICD-10-CM | POA: Diagnosis not present

## 2016-02-25 DIAGNOSIS — Z124 Encounter for screening for malignant neoplasm of cervix: Secondary | ICD-10-CM | POA: Diagnosis not present

## 2016-04-06 DIAGNOSIS — L904 Acrodermatitis chronica atrophicans: Secondary | ICD-10-CM | POA: Diagnosis not present

## 2016-04-06 DIAGNOSIS — Z13820 Encounter for screening for osteoporosis: Secondary | ICD-10-CM | POA: Diagnosis not present

## 2016-04-06 DIAGNOSIS — M8588 Other specified disorders of bone density and structure, other site: Secondary | ICD-10-CM | POA: Diagnosis not present

## 2016-04-06 DIAGNOSIS — N958 Other specified menopausal and perimenopausal disorders: Secondary | ICD-10-CM | POA: Diagnosis not present

## 2016-04-21 DIAGNOSIS — Z0100 Encounter for examination of eyes and vision without abnormal findings: Secondary | ICD-10-CM | POA: Diagnosis not present

## 2016-04-21 DIAGNOSIS — H353122 Nonexudative age-related macular degeneration, left eye, intermediate dry stage: Secondary | ICD-10-CM | POA: Diagnosis not present

## 2016-04-21 DIAGNOSIS — H2513 Age-related nuclear cataract, bilateral: Secondary | ICD-10-CM | POA: Diagnosis not present

## 2016-04-21 DIAGNOSIS — H353112 Nonexudative age-related macular degeneration, right eye, intermediate dry stage: Secondary | ICD-10-CM | POA: Diagnosis not present

## 2016-06-01 DIAGNOSIS — L904 Acrodermatitis chronica atrophicans: Secondary | ICD-10-CM | POA: Diagnosis not present

## 2016-07-27 ENCOUNTER — Other Ambulatory Visit: Payer: Self-pay | Admitting: Internal Medicine

## 2016-07-27 DIAGNOSIS — Z1231 Encounter for screening mammogram for malignant neoplasm of breast: Secondary | ICD-10-CM

## 2016-09-02 ENCOUNTER — Ambulatory Visit
Admission: RE | Admit: 2016-09-02 | Discharge: 2016-09-02 | Disposition: A | Discharge status: Home / Self Care | Payer: Medicare Other | Source: Ambulatory Visit | Attending: Internal Medicine | Admitting: Internal Medicine

## 2016-09-02 DIAGNOSIS — Z1231 Encounter for screening mammogram for malignant neoplasm of breast: Secondary | ICD-10-CM

## 2016-09-09 DIAGNOSIS — Z23 Encounter for immunization: Secondary | ICD-10-CM | POA: Diagnosis not present

## 2016-09-21 DIAGNOSIS — R110 Nausea: Secondary | ICD-10-CM | POA: Diagnosis not present

## 2016-09-21 DIAGNOSIS — H8113 Benign paroxysmal vertigo, bilateral: Secondary | ICD-10-CM | POA: Diagnosis not present

## 2016-10-13 DIAGNOSIS — H353132 Nonexudative age-related macular degeneration, bilateral, intermediate dry stage: Secondary | ICD-10-CM | POA: Diagnosis not present

## 2016-10-13 DIAGNOSIS — H2513 Age-related nuclear cataract, bilateral: Secondary | ICD-10-CM | POA: Diagnosis not present

## 2016-10-21 DIAGNOSIS — L720 Epidermal cyst: Secondary | ICD-10-CM | POA: Diagnosis not present

## 2016-10-21 DIAGNOSIS — L821 Other seborrheic keratosis: Secondary | ICD-10-CM | POA: Diagnosis not present

## 2016-10-21 DIAGNOSIS — L570 Actinic keratosis: Secondary | ICD-10-CM | POA: Diagnosis not present

## 2016-10-21 DIAGNOSIS — D2261 Melanocytic nevi of right upper limb, including shoulder: Secondary | ICD-10-CM | POA: Diagnosis not present

## 2016-10-21 DIAGNOSIS — L814 Other melanin hyperpigmentation: Secondary | ICD-10-CM | POA: Diagnosis not present

## 2016-10-21 DIAGNOSIS — Z85828 Personal history of other malignant neoplasm of skin: Secondary | ICD-10-CM | POA: Diagnosis not present

## 2016-10-21 DIAGNOSIS — D1801 Hemangioma of skin and subcutaneous tissue: Secondary | ICD-10-CM | POA: Diagnosis not present

## 2016-12-01 ENCOUNTER — Other Ambulatory Visit: Payer: Self-pay | Admitting: Neurological Surgery

## 2016-12-01 DIAGNOSIS — M542 Cervicalgia: Secondary | ICD-10-CM

## 2017-06-26 ENCOUNTER — Ambulatory Visit
Admission: RE | Admit: 2017-06-26 | Discharge: 2017-06-26 | Disposition: A | Discharge status: Home / Self Care | Payer: Medicare Other | Source: Ambulatory Visit | Attending: Neurological Surgery | Admitting: Neurological Surgery

## 2017-06-26 ENCOUNTER — Other Ambulatory Visit: Payer: Medicare Other

## 2017-06-26 DIAGNOSIS — M542 Cervicalgia: Secondary | ICD-10-CM

## 2017-07-27 DIAGNOSIS — M546 Pain in thoracic spine: Secondary | ICD-10-CM | POA: Diagnosis not present

## 2017-07-27 DIAGNOSIS — G8929 Other chronic pain: Secondary | ICD-10-CM | POA: Diagnosis not present

## 2017-08-04 DIAGNOSIS — Z23 Encounter for immunization: Secondary | ICD-10-CM | POA: Diagnosis not present

## 2017-09-01 DIAGNOSIS — N9089 Other specified noninflammatory disorders of vulva and perineum: Secondary | ICD-10-CM | POA: Diagnosis not present

## 2017-09-01 DIAGNOSIS — L904 Acrodermatitis chronica atrophicans: Secondary | ICD-10-CM | POA: Diagnosis not present

## 2017-09-01 DIAGNOSIS — N760 Acute vaginitis: Secondary | ICD-10-CM | POA: Diagnosis not present

## 2017-09-06 ENCOUNTER — Other Ambulatory Visit: Payer: Self-pay | Admitting: Internal Medicine

## 2017-09-06 DIAGNOSIS — N9089 Other specified noninflammatory disorders of vulva and perineum: Secondary | ICD-10-CM | POA: Diagnosis not present

## 2017-09-06 DIAGNOSIS — Z1231 Encounter for screening mammogram for malignant neoplasm of breast: Secondary | ICD-10-CM

## 2017-09-07 ENCOUNTER — Ambulatory Visit
Admission: RE | Admit: 2017-09-07 | Discharge: 2017-09-07 | Disposition: A | Discharge status: Home / Self Care | Payer: Medicare Other | Source: Ambulatory Visit | Attending: Internal Medicine | Admitting: Internal Medicine

## 2017-09-07 DIAGNOSIS — Z1231 Encounter for screening mammogram for malignant neoplasm of breast: Secondary | ICD-10-CM | POA: Diagnosis not present

## 2017-09-08 DIAGNOSIS — H2513 Age-related nuclear cataract, bilateral: Secondary | ICD-10-CM | POA: Diagnosis not present

## 2017-09-08 DIAGNOSIS — H524 Presbyopia: Secondary | ICD-10-CM | POA: Diagnosis not present

## 2017-09-08 DIAGNOSIS — H353132 Nonexudative age-related macular degeneration, bilateral, intermediate dry stage: Secondary | ICD-10-CM | POA: Diagnosis not present

## 2017-09-13 DIAGNOSIS — N762 Acute vulvitis: Secondary | ICD-10-CM | POA: Diagnosis not present

## 2017-10-06 DIAGNOSIS — G4710 Hypersomnia, unspecified: Secondary | ICD-10-CM | POA: Diagnosis not present

## 2017-10-06 DIAGNOSIS — E538 Deficiency of other specified B group vitamins: Secondary | ICD-10-CM | POA: Diagnosis not present

## 2017-10-06 DIAGNOSIS — R0683 Snoring: Secondary | ICD-10-CM | POA: Diagnosis not present

## 2017-10-06 DIAGNOSIS — E669 Obesity, unspecified: Secondary | ICD-10-CM | POA: Diagnosis not present

## 2017-10-06 DIAGNOSIS — R197 Diarrhea, unspecified: Secondary | ICD-10-CM | POA: Diagnosis not present

## 2017-10-06 DIAGNOSIS — F322 Major depressive disorder, single episode, severe without psychotic features: Secondary | ICD-10-CM | POA: Diagnosis not present

## 2017-10-06 DIAGNOSIS — J329 Chronic sinusitis, unspecified: Secondary | ICD-10-CM | POA: Diagnosis not present

## 2017-10-21 DIAGNOSIS — H25811 Combined forms of age-related cataract, right eye: Secondary | ICD-10-CM | POA: Diagnosis not present

## 2017-10-21 DIAGNOSIS — H2511 Age-related nuclear cataract, right eye: Secondary | ICD-10-CM | POA: Diagnosis not present

## 2017-11-02 DIAGNOSIS — N762 Acute vulvitis: Secondary | ICD-10-CM | POA: Diagnosis not present

## 2017-12-02 DIAGNOSIS — H2512 Age-related nuclear cataract, left eye: Secondary | ICD-10-CM | POA: Diagnosis not present

## 2017-12-02 DIAGNOSIS — H25812 Combined forms of age-related cataract, left eye: Secondary | ICD-10-CM | POA: Diagnosis not present

## 2017-12-22 DIAGNOSIS — D1801 Hemangioma of skin and subcutaneous tissue: Secondary | ICD-10-CM | POA: Diagnosis not present

## 2017-12-22 DIAGNOSIS — D485 Neoplasm of uncertain behavior of skin: Secondary | ICD-10-CM | POA: Diagnosis not present

## 2017-12-22 DIAGNOSIS — D2262 Melanocytic nevi of left upper limb, including shoulder: Secondary | ICD-10-CM | POA: Diagnosis not present

## 2017-12-22 DIAGNOSIS — L821 Other seborrheic keratosis: Secondary | ICD-10-CM | POA: Diagnosis not present

## 2017-12-22 DIAGNOSIS — D0439 Carcinoma in situ of skin of other parts of face: Secondary | ICD-10-CM | POA: Diagnosis not present

## 2017-12-22 DIAGNOSIS — L853 Xerosis cutis: Secondary | ICD-10-CM | POA: Diagnosis not present

## 2017-12-22 DIAGNOSIS — L570 Actinic keratosis: Secondary | ICD-10-CM | POA: Diagnosis not present

## 2018-01-13 DIAGNOSIS — B3789 Other sites of candidiasis: Secondary | ICD-10-CM | POA: Diagnosis not present

## 2018-02-07 DIAGNOSIS — E739 Lactose intolerance, unspecified: Secondary | ICD-10-CM | POA: Diagnosis not present

## 2018-02-07 DIAGNOSIS — F329 Major depressive disorder, single episode, unspecified: Secondary | ICD-10-CM | POA: Diagnosis not present

## 2018-02-07 DIAGNOSIS — E538 Deficiency of other specified B group vitamins: Secondary | ICD-10-CM | POA: Diagnosis not present

## 2018-02-07 DIAGNOSIS — Z7189 Other specified counseling: Secondary | ICD-10-CM | POA: Diagnosis not present

## 2018-02-07 DIAGNOSIS — B3789 Other sites of candidiasis: Secondary | ICD-10-CM | POA: Diagnosis not present

## 2018-02-07 DIAGNOSIS — E669 Obesity, unspecified: Secondary | ICD-10-CM | POA: Diagnosis not present

## 2018-02-07 DIAGNOSIS — F419 Anxiety disorder, unspecified: Secondary | ICD-10-CM | POA: Diagnosis not present

## 2018-02-07 DIAGNOSIS — Z0000 Encounter for general adult medical examination without abnormal findings: Secondary | ICD-10-CM | POA: Diagnosis not present

## 2018-02-07 DIAGNOSIS — F322 Major depressive disorder, single episode, severe without psychotic features: Secondary | ICD-10-CM | POA: Diagnosis not present

## 2018-02-07 DIAGNOSIS — E559 Vitamin D deficiency, unspecified: Secondary | ICD-10-CM | POA: Diagnosis not present

## 2018-02-07 DIAGNOSIS — Z1389 Encounter for screening for other disorder: Secondary | ICD-10-CM | POA: Diagnosis not present

## 2018-02-07 DIAGNOSIS — R197 Diarrhea, unspecified: Secondary | ICD-10-CM | POA: Diagnosis not present

## 2018-02-07 DIAGNOSIS — J309 Allergic rhinitis, unspecified: Secondary | ICD-10-CM | POA: Diagnosis not present

## 2018-02-07 DIAGNOSIS — I10 Essential (primary) hypertension: Secondary | ICD-10-CM | POA: Diagnosis not present

## 2018-02-07 DIAGNOSIS — R0683 Snoring: Secondary | ICD-10-CM | POA: Diagnosis not present

## 2018-02-07 DIAGNOSIS — G4710 Hypersomnia, unspecified: Secondary | ICD-10-CM | POA: Diagnosis not present

## 2018-03-09 DIAGNOSIS — D485 Neoplasm of uncertain behavior of skin: Secondary | ICD-10-CM | POA: Diagnosis not present

## 2018-03-09 DIAGNOSIS — L905 Scar conditions and fibrosis of skin: Secondary | ICD-10-CM | POA: Diagnosis not present

## 2018-03-09 DIAGNOSIS — L570 Actinic keratosis: Secondary | ICD-10-CM | POA: Diagnosis not present

## 2018-03-09 DIAGNOSIS — D0439 Carcinoma in situ of skin of other parts of face: Secondary | ICD-10-CM | POA: Diagnosis not present

## 2018-04-13 DIAGNOSIS — N958 Other specified menopausal and perimenopausal disorders: Secondary | ICD-10-CM | POA: Diagnosis not present

## 2018-04-13 DIAGNOSIS — D0439 Carcinoma in situ of skin of other parts of face: Secondary | ICD-10-CM | POA: Diagnosis not present

## 2018-04-13 DIAGNOSIS — M8588 Other specified disorders of bone density and structure, other site: Secondary | ICD-10-CM | POA: Diagnosis not present

## 2018-04-19 DIAGNOSIS — Z6833 Body mass index (BMI) 33.0-33.9, adult: Secondary | ICD-10-CM | POA: Diagnosis not present

## 2018-04-19 DIAGNOSIS — N390 Urinary tract infection, site not specified: Secondary | ICD-10-CM | POA: Diagnosis not present

## 2018-04-19 DIAGNOSIS — Z124 Encounter for screening for malignant neoplasm of cervix: Secondary | ICD-10-CM | POA: Diagnosis not present

## 2018-04-28 DIAGNOSIS — Z85828 Personal history of other malignant neoplasm of skin: Secondary | ICD-10-CM | POA: Diagnosis not present

## 2018-04-28 DIAGNOSIS — L218 Other seborrheic dermatitis: Secondary | ICD-10-CM | POA: Diagnosis not present

## 2018-06-29 DIAGNOSIS — Z961 Presence of intraocular lens: Secondary | ICD-10-CM | POA: Diagnosis not present

## 2018-06-29 DIAGNOSIS — L218 Other seborrheic dermatitis: Secondary | ICD-10-CM | POA: Diagnosis not present

## 2018-06-29 DIAGNOSIS — Z85828 Personal history of other malignant neoplasm of skin: Secondary | ICD-10-CM | POA: Diagnosis not present

## 2018-06-29 DIAGNOSIS — H353132 Nonexudative age-related macular degeneration, bilateral, intermediate dry stage: Secondary | ICD-10-CM | POA: Diagnosis not present

## 2018-07-11 DIAGNOSIS — M79644 Pain in right finger(s): Secondary | ICD-10-CM | POA: Diagnosis not present

## 2018-08-10 DIAGNOSIS — Z23 Encounter for immunization: Secondary | ICD-10-CM | POA: Diagnosis not present

## 2018-08-10 DIAGNOSIS — M549 Dorsalgia, unspecified: Secondary | ICD-10-CM | POA: Diagnosis not present

## 2018-08-10 DIAGNOSIS — I10 Essential (primary) hypertension: Secondary | ICD-10-CM | POA: Diagnosis not present

## 2018-08-10 DIAGNOSIS — F322 Major depressive disorder, single episode, severe without psychotic features: Secondary | ICD-10-CM | POA: Diagnosis not present

## 2018-08-10 DIAGNOSIS — Z889 Allergy status to unspecified drugs, medicaments and biological substances status: Secondary | ICD-10-CM | POA: Diagnosis not present

## 2018-08-10 DIAGNOSIS — F419 Anxiety disorder, unspecified: Secondary | ICD-10-CM | POA: Diagnosis not present

## 2018-08-10 DIAGNOSIS — E785 Hyperlipidemia, unspecified: Secondary | ICD-10-CM | POA: Diagnosis not present

## 2018-08-10 DIAGNOSIS — R197 Diarrhea, unspecified: Secondary | ICD-10-CM | POA: Diagnosis not present

## 2018-08-10 DIAGNOSIS — E669 Obesity, unspecified: Secondary | ICD-10-CM | POA: Diagnosis not present

## 2018-08-30 ENCOUNTER — Ambulatory Visit: Payer: Medicare Other | Attending: Internal Medicine | Admitting: Physical Therapy

## 2018-08-30 ENCOUNTER — Other Ambulatory Visit: Payer: Self-pay

## 2018-08-30 ENCOUNTER — Encounter: Payer: Self-pay | Admitting: Physical Therapy

## 2018-08-30 DIAGNOSIS — R293 Abnormal posture: Secondary | ICD-10-CM | POA: Insufficient documentation

## 2018-08-30 DIAGNOSIS — M546 Pain in thoracic spine: Secondary | ICD-10-CM | POA: Diagnosis not present

## 2018-08-30 DIAGNOSIS — M6281 Muscle weakness (generalized): Secondary | ICD-10-CM | POA: Insufficient documentation

## 2018-08-30 DIAGNOSIS — R2689 Other abnormalities of gait and mobility: Secondary | ICD-10-CM | POA: Diagnosis not present

## 2018-08-30 NOTE — Patient Instructions (Addendum)
   Trigger Point Dry Needling  . What is Trigger Point Dry Needling (DN)? o DN is a physical therapy technique used to treat muscle pain and dysfunction. Specifically, DN helps deactivate muscle trigger points (muscle knots).  o A thin filiform needle is used to penetrate the skin and stimulate the underlying trigger point. The goal is for a local twitch response (LTR) to occur and for the trigger point to relax. No medication of any kind is injected during the procedure.   . What Does Trigger Point Dry Needling Feel Like?  o The procedure feels different for each individual patient. Some patients report that they do not actually feel the needle enter the skin and overall the process is not painful. Very mild bleeding may occur. However, many patients feel a deep cramping in the muscle in which the needle was inserted. This is the local twitch response.   Marland Kitchen How Will I feel after the treatment? o Soreness is normal, and the onset of soreness may not occur for a few hours. Typically this soreness does not last longer than two days.  o Bruising is uncommon, however; ice can be used to decrease any possible bruising.  o In rare cases feeling tired or nauseous after the treatment is normal. In addition, your symptoms may get worse before they get better, this period will typically not last longer than 24 hours.   . What Can I do After My Treatment? o Increase your hydration by drinking more water for the next 24 hours. o You may place ice or heat on the areas treated that have become sore, however, do not use heat on inflamed or bruised areas. Heat often brings more relief post needling. o You can continue your regular activities, but vigorous activity is not recommended initially after the treatment for 24 hours. o DN is best combined with other physical therapy such as strengthening, stretching, and other therapies.   [name], PT Certified Exercise Expert for the Aging Adult  08/30/18  10:40 AM Phone: [phone] Fax: [phone]

## 2018-08-30 NOTE — Therapy (Signed)
[place], [place], [address] Phone: [phone]   Fax:  [phone]  Physical Therapy Evaluation  Patient Details  Name: [name] MRN: [id number] Date of Birth: 02-Aug-1941 Referring Provider (PT): Josetta Huddle MD   Encounter Date: 08/30/2018  PT End of Session - 08/30/18 1019    Visit Number  1    Number of Visits  12    Date for PT Re-Evaluation  10/11/18    Authorization Type  medicare KX modifier at 15 and progressnote at 10th visit    PT Start Time  1016    PT Stop Time  1100    PT Time Calculation (min)  44 min    Activity Tolerance  Patient tolerated treatment well;Patient limited by pain    Behavior During Therapy  [facility] for tasks assessed/performed       Past Medical History:  Diagnosis Date  . Arthritis   . CVA (cerebral vascular accident) ([place])   . Depression   . GERD (gastroesophageal reflux disease)   . Hypertension     Past Surgical History:  Procedure Laterality Date  . CHOLECYSTECTOMY N/A 08/28/2015   Procedure: LAPAROSCOPIC CHOLECYSTECTOMY;  Surgeon: Fanny Skates, MD;  Location: [facility];  Service: General;  Laterality: N/A;  . HIATAL HERNIA REPAIR    . WISDOM TOOTH EXTRACTION      There were no vitals filed for this visit.   Subjective Assessment - 08/30/18 1022    Subjective  About 3 weeks ago I started having right upper back pain just like the last time I was going to PT with [name]. Former Pharmacist, hospital at [employer]. pt is unable to walk greater than 30 minutes without 7/10 pain in upper back.  Pt also mentions she feels like her balance is off.  Mrs. Hildred Alamin also she walks slower due to pain in upper back    Pertinent History  CVA 2012,  GERD, depression, See medical history    Limitations  Sitting;Standing;House hold activities    How long can you sit comfortably?  30 minutes maximum    How long can you stand comfortably?  standing max 30 minutes and  cant stand my upper back pain    How long can you walk comfortably?  cant  walk for more than 30 minutes before spasming    Diagnostic tests  no xrays    Patient Stated Goals  Get rid of pain    Currently in Pain?  Yes    Pain Score  7     Pain Location  Thoracic    Pain Orientation  Right    Pain Descriptors / Indicators  Aching;Spasm    Pain Type  Acute pain    Pain Onset  1 to 4 weeks ago   3 weeks    Pain Frequency  Intermittent    Aggravating Factors   cant sit of stand for more than 30 minutes, cant do walking program    Pain Relieving Factors  nothing but medication but would prefer not to use them         [facility] PT Assessment - 08/30/18 1029      Assessment   Medical Diagnosis  back pain, upper back pain    Referring Provider (PT)  Josetta Huddle MD    Onset Date/Surgical Date  08/08/18    Hand Dominance  Right    Prior Therapy  yes about a year ago in same clinic  Balance Screen   Has the patient fallen in the past 6 months  No    Has the patient had a decrease in activity level because of a fear of falling?   No    Is the patient reluctant to leave their home because of a fear of falling?   No      Home Film/video editor residence    Living Arrangements  Spouse/significant other    Additional Comments  has stairs in home       Prior Function   Level of Independence  Independent      Cognition   Overall Cognitive Status  Within Functional Limits for tasks assessed      Observation/Other Assessments   Focus on Therapeutic Outcomes (FOTO)   Foto Intake 47% limitaaon 53% predicted 37%      Sensation   Light Touch  Appears Intact    Hot/Cold  Appears Intact    Proprioception  Appears Intact      Functional Tests   Functional tests  Sit to Stand;Other      Sit to Stand   Comments  18.26      Other:   Other/ Comments  15.5 cm head /tagus to wall      Posture/Postural Control   Posture/Postural Control  Postural limitations     Postural Limitations  Rounded Shoulders;Forward head    Posture Comments  thoracic kyphosis      ROM / Strength   AROM / PROM / Strength  AROM;Strength      AROM   Overall AROM   Deficits    Right Shoulder Flexion  140 Degrees   pain in right rhomboid   Right Shoulder ABduction  128 Degrees   pain in right rhomboid   Left Shoulder Flexion  150 Degrees    Left Shoulder ABduction  142 Degrees    Lumbar Flexion  50    Lumbar Extension  13    Lumbar - Right Side Bend  20    Lumbar - Left Side Bend  18    Lumbar - Right Rotation  50% available     Lumbar - Left Rotation  50% available    Thoracic Flexion  15   beginnnig at +10   Thoracic Extension  +10      Strength   Overall Strength  Deficits    Right Shoulder Flexion  3-/5    Right Shoulder ABduction  3-/5    Left Shoulder Flexion  4/5    Left Shoulder ABduction  4/5    Right Hip Flexion  4-/5    Right Hip Extension  3-/5    Right Hip ABduction  3-/5    Left Hip Flexion  4/5    Left Hip Extension  3-/5    Left Hip ABduction  3-/5    Right Knee Flexion  4/5    Right Knee Extension  4/5    Left Knee Flexion  4/5    Left Knee Extension  4/5      Flexibility   Hamstrings  bil hamstrings tight ness 50 degrees bil      Palpation   Spinal mobility  hypomobiity over thoracic and lumbar spine    Palpation comment  tenderness over bil low back paraspinals and right thoracic/ rhomoboid area, TTP over rib junctions 5-6-7-8, also tenderness over right pectoral muscles      Ambulation/Gait   Assistive device  None    Gait Pattern  Decreased hip/knee flexion - right;Decreased hip/knee flexion - left;Decreased step length - right;Decreased step length - left;Decreased arm swing - right;Decreased arm swing - left    Ambulation Surface  Level    Gait velocity  1.87 ft/sec   gaurding due to upper back pain   Gait Comments  stiffened and upper thoracic gaurding in gait due to pain                Objective measurements  completed on examination: See above findings.      [place] Adult PT Treatment/Exercise - 08/30/18 1029      Self-Care   Self-Care  Other Self-Care Comments    Other Self-Care Comments   education on TPDN after care and precautions       Shoulder Exercises: Stretch   Corner Stretch  3 reps;20 seconds    Corner Stretch Limitations  right arm with pain at 90 degrees in right scapula      Manual Therapy   Manual Therapy  Soft tissue mobilization    Manual therapy comments  soft tissue jt mobs with pt stretching in sitting over chair 5 x 5 sec hold over thoracic spine     Soft tissue mobilization  IASTYM over right rhomboids and thoracic paraspinals. T- 5 to T-9 right       Trigger Point Dry Needling - 08/30/18 1043    Consent Given?  Yes    Education Handout Provided  Yes    Muscles Treated Upper Body  Rhomboids;Longissimus   right side only   Rhomboids Response  Twitch response elicited;Palpable increased muscle length    Longissimus Response  Twitch response elicited;Palpable increased muscle length           PT Education - 08/30/18 1043    Education Details  POC, explanation of findings education on TPDN after care and precautians, initial HEP for thoracic extension    Person(s) Educated  Patient    Methods  Explanation;Demonstration;Tactile cues;Verbal cues;Handout    Comprehension  Verbalized understanding;Returned demonstration       PT Short Term Goals - 08/30/18 1047      PT SHORT TERM GOAL #1   Title  Pt will be independent with initial HEP    Time  2    Period  Weeks    Status  New    Target Date  09/13/18      PT SHORT TERM GOAL #2   Title  She will report incidents of pain decreased 50% or more in intensity or frequency while walking and standing to do chores    Baseline  7/10 after walking , standing 30 minutes     Time  2    Period  Weeks    Status  New    Target Date  09/13/18        PT Long Term Goals - 08/30/18 1139      PT LONG TERM GOAL #1    Title  Pt will be independent in advanced HEP    Baseline  Pt with decreased AROM for exercise on eval. /and muscle weakness    Time  6    Period  Weeks    Status  New    Target Date  10/11/18      PT LONG TERM GOAL #2   Title  She will report no incidents of pain for at least 10 days    Baseline  Pt with daily right scapular pain    Time  6  Period  Weeks    Status  New      PT LONG TERM GOAL #3   Title  She will report generally minimal tenderness in paraspinals /medial scapula on RT     Baseline  Pt with 7/10 pain on medial paraspinal    Time  6    Period  Weeks    Status  New      PT LONG TERM GOAL #4   Title  Pt will be able to walk for 30 to 45 minutes with minimal right thoracic/scapular pain    Baseline  7/10 pain on eval    Time  6    Period  Weeks    Status  New      PT LONG TERM GOAL #5   Title  Pt will be able to perform 5 x sts in 14 sec or under to show increased  LE strength and control for improved balance for walking    Baseline  Pt with 18. 26  sec in 5 x sts and uses momentum to come to standing on eval    Time  6    Period  Weeks    Status  New      PT LONG TERM GOAL #6   Title  "FOTO will improve from 53%limitation   to 37% limitation    indicating improved functional mobility .     Time  6    Period  Weeks    Status  New    Target Date  10/11/18      PT LONG TERM GOAL #7   Title  Pt will be able to walk at least at community level 2.56ft/sed for safe ambulation    Time  6    Period  Weeks    Status  New    Target Date  10/11/18             Plan - 08/30/18 1344    Clinical Impression Statement  77 yo female presents with back and thoracic pain mainly in right rhomboid/thoracic spine with spasms and TTP over rib junctions 5, 6 , 7 and 8,  Pt states she has been feeling weaker in past 6 months and balance is off as well.  Pt 5 x sts is 18.26 sec ( normal 13 for age) and indicative of LE weakness.  Pt will benefit from skilled PT to  address general weakness that affects gait and ability to participate in exercise and walking.  Pt cannot walk or stand for longer than 30 mintues iwth out 7/10 thoracic pain. Pt consented to [facility] for rhomboid spasms and was closely monitored throughout session . Will continue to given and reinforce good HEP to address deficits    Clinical Presentation  Stable    Clinical Decision Making  Low    Rehab Potential  Good    PT Frequency  2x / week    PT Treatment/Interventions  ADLs/Self Care Home Management;Cryotherapy;Electrical Stimulation;Iontophoresis 4mg /ml Dexamethasone;Moist Heat;Ultrasound;Therapeutic exercise;Therapeutic activities;Functional mobility training;Gait training;Neuromuscular re-education;Patient/family education;Manual techniques;Passive range of motion;Dry needling;Taping;Joint Manipulations    PT Next Visit Plan  basic back and thoracic extension.  ST S    PT Home Exercise Plan  thoracic extensions    Consulted and Agree with Plan of Care  Patient       Patient will benefit from skilled therapeutic intervention in order to improve the following deficits and impairments:  Difficulty walking, Decreased activity tolerance, Decreased mobility, Decreased range of motion, Decreased strength,  Hypomobility, Increased muscle spasms, Pain, Postural dysfunction, Improper body mechanics  Visit Diagnosis: Pain in thoracic spine  Abnormal posture  Muscle weakness (generalized)  Other abnormalities of gait and mobility     Problem List Patient Active Problem List   Diagnosis Date Noted  . 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 CVA (cerebral vascular accident) ([place])    [name], PT Certified Exercise Expert for the Aging Adult  08/30/18 1:54 PM Phone: [phone] Fax: [facility] [address] [place], [place], [address] Phone: [phone]   Fax:   [phone]  Name: LELAH RENNAKER MRN: [id number] Date of Birth: 07/16/1941

## 2018-09-08 ENCOUNTER — Encounter: Payer: Self-pay | Admitting: Physical Therapy

## 2018-09-08 ENCOUNTER — Ambulatory Visit: Payer: Medicare Other | Admitting: Physical Therapy

## 2018-09-08 DIAGNOSIS — R293 Abnormal posture: Secondary | ICD-10-CM

## 2018-09-08 DIAGNOSIS — M546 Pain in thoracic spine: Secondary | ICD-10-CM

## 2018-09-08 DIAGNOSIS — M6281 Muscle weakness (generalized): Secondary | ICD-10-CM | POA: Diagnosis not present

## 2018-09-08 DIAGNOSIS — R2689 Other abnormalities of gait and mobility: Secondary | ICD-10-CM

## 2018-09-08 NOTE — Patient Instructions (Signed)
Issued from HEP drawer: Decompression series Decompression 5-15 minutes Head shoulder,leg press and leg lengthener 5 x 5 seconds 1 pillow under each leg.

## 2018-09-08 NOTE — Therapy (Signed)
[place], [place], [address] Phone: [phone]   Fax:  [phone]  Physical Therapy Treatment  Patient Details  Name: [name] MRN: [id number] Date of Birth: 05/31/1941 Referring Provider (PT): Josetta Huddle MD   Encounter Date: 09/08/2018  PT End of Session - 09/08/18 1126    Visit Number  2    Number of Visits  12    Date for PT Re-Evaluation  10/11/18    Authorization Type  medicare KX modifier at 15 and progressnote at 10th visit    PT Start Time  1015    PT Stop Time  1103    PT Time Calculation (min)  48 min    Activity Tolerance  Patient tolerated treatment well    Behavior During Therapy  [facility] for tasks assessed/performed       Past Medical History:  Diagnosis Date  . Arthritis   . CVA (cerebral vascular accident) ([place])   . Depression   . GERD (gastroesophageal reflux disease)   . Hypertension     Past Surgical History:  Procedure Laterality Date  . CHOLECYSTECTOMY N/A 08/28/2015   Procedure: LAPAROSCOPIC CHOLECYSTECTOMY;  Surgeon: Fanny Skates, MD;  Location: [place];  Service: General;  Laterality: N/A;  . HIATAL HERNIA REPAIR    . WISDOM TOOTH EXTRACTION      There were no vitals filed for this visit.  Subjective Assessment - 09/08/18 1008    Subjective  Up to 7/10  , I do things around house and thin it startes to hurt.  I get pain anytime.  I don't walk as much as I should. Walking does not hurt my back or shoulder..  My trouble with walking is my balance.  ( also foot pain)    Currently in Pain?  No/denies    Pain Score  0-No pain   up to 7/10   Pain Location  Thoracic    Pain Orientation  Right    Pain Descriptors / Indicators  Aching;Spasm    Pain Type  Acute pain    Pain Frequency  Intermittent    Aggravating Factors   happens anytime,  washing dishes more than 30 minutes    Pain Relieving Factors  change of position.  reaching arm overhead right with trunk  lean    Multiple Pain Sites  --   has left foot pain 2nd toe                      [facility] Adult PT Treatment/Exercise - 09/08/18 0001      Self-Care   Self-Care  --   lose weight slowly with osteoporosis lesschallanging tobones   Other Self-Care Comments   pillow height,  options,  she tried the towel roll on did not like,  showed her the butterfly pillow technique to try at home.       Shoulder Exercises: Supine   Other Supine Exercises  decompression series all practiced,  shoulder press,  head press.  leg press,  leglengthener   needed 1 pillow under legs.  added to HEP.     Manual Therapy   Manual Therapy  Soft tissue mobilization    Manual therapy comments  tissue softened.     Soft tissue mobilization  IASTYM over right rhomboids and thoracic paraspinals,  brief.  then shirt removed and lotion used with hands ,  light trigget point release upper trap and levaotr  and paraspinals  does not  tolerate moderate pressure.       Ankle Exercises: Seated   Towel Crunch Limitations  painful 2nd toe    Other Seated Ankle Exercises  toe extension  sore anterior foot             PT Education - 09/08/18 1123    Education Details  HEP, self care.     Person(s) Educated  Patient    Methods  Explanation;Demonstration;Tactile cues;Verbal cues;Handout    Comprehension  Verbalized understanding;Returned demonstration;Need further instruction       PT Short Term Goals - 08/30/18 1047      PT SHORT TERM GOAL #1   Title  Pt will be independent with initial HEP    Time  2    Period  Weeks    Status  New    Target Date  09/13/18      PT SHORT TERM GOAL #2   Title  She will report incidents of pain decreased 50% or more in intensity or frequency while walking and standing to do chores    Baseline  7/10 after walking , standing 30 minutes     Time  2    Period  Weeks    Status  New    Target Date  09/13/18        PT Long Term Goals - 08/30/18 1139      PT LONG  TERM GOAL #1   Title  Pt will be independent in advanced HEP    Baseline  Pt with decreased AROM for exercise on eval. /and muscle weakness    Time  6    Period  Weeks    Status  New    Target Date  10/11/18      PT LONG TERM GOAL #2   Title  She will report no incidents of pain for at least 10 days    Baseline  Pt with daily right scapular pain    Time  6    Period  Weeks    Status  New      PT LONG TERM GOAL #3   Title  She will report generally minimal tenderness in paraspinals /medial scapula on RT     Baseline  Pt with 7/10 pain on medial paraspinal    Time  6    Period  Weeks    Status  New      PT LONG TERM GOAL #4   Title  Pt will be able to walk for 30 to 45 minutes with minimal right thoracic/scapular pain    Baseline  7/10 pain on eval    Time  6    Period  Weeks    Status  New      PT LONG TERM GOAL #5   Title  Pt will be able to perform 5 x sts in 14 sec or under to show increased  LE strength and control for improved balance for walking    Baseline  Pt with 18. 26  sec in 5 x sts and uses momentum to come to standing on eval    Time  6    Period  Weeks    Status  New      PT LONG TERM GOAL #6   Title  "FOTO will improve from 53%limitation   to 37% limitation    indicating improved functional mobility .     Time  6    Period  Weeks    Status  New  Target Date  10/11/18      PT LONG TERM GOAL #7   Title  Pt will be able to walk at least at community level 2.79ft/sed for safe ambulation    Time  6    Period  Weeks    Status  New    Target Date  10/11/18            Plan - 09/08/18 1127    Clinical Impression Statement  Patient was able to progress her HEP to address pain and posture. .  She has foot pain which does not help balance with walking.  She was dizzy initially after STW prone  and needed to sit prior to leaving.  She likes working with Donnetta Simpers.     PT Next Visit Plan  basic back and thoracic extension.  STW    PT Home Exercise Plan   thoracic extensions,  decompression series    Consulted and Agree with Plan of Care  Patient       Patient will benefit from skilled therapeutic intervention in order to improve the following deficits and impairments:     Visit Diagnosis: Pain in thoracic spine  Abnormal posture  Muscle weakness (generalized)  Other abnormalities of gait and mobility     Problem List Patient Active Problem List   Diagnosis Date Noted  . 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 CVA (cerebral vascular accident) [facility])     [place] PTA 09/08/2018, 11:33 AM  [place], [place], [address] Phone: [phone]   Fax:  [phone]  Name: [name] MRN: [id number] Date of Birth: [birth year]

## 2018-09-09 ENCOUNTER — Encounter

## 2018-09-13 ENCOUNTER — Ambulatory Visit: Payer: Medicare Other | Admitting: Physical Therapy

## 2018-09-13 ENCOUNTER — Encounter: Payer: Self-pay | Admitting: Physical Therapy

## 2018-09-13 DIAGNOSIS — R2689 Other abnormalities of gait and mobility: Secondary | ICD-10-CM

## 2018-09-13 DIAGNOSIS — M6281 Muscle weakness (generalized): Secondary | ICD-10-CM | POA: Diagnosis not present

## 2018-09-13 DIAGNOSIS — M546 Pain in thoracic spine: Secondary | ICD-10-CM | POA: Diagnosis not present

## 2018-09-13 DIAGNOSIS — R293 Abnormal posture: Secondary | ICD-10-CM | POA: Diagnosis not present

## 2018-09-13 NOTE — Therapy (Signed)
[place], [place], [address] Phone: [phone]   Fax:  [phone]  Physical Therapy Treatment  Patient Details  Name: [name] MRN: [id number] Date of Birth: Aug 05, 1941 Referring Provider (PT): Josetta Huddle MD   Encounter Date: 09/13/2018  PT End of Session - 09/13/18 1357    Visit Number  3    Number of Visits  12    Date for PT Re-Evaluation  10/11/18    Authorization Type  medicare KX modifier at 15 and progressnote at 10th visit    PT Start Time  1330    PT Stop Time  1420    PT Time Calculation (min)  50 min    Activity Tolerance  Patient tolerated treatment well    Behavior During Therapy  [facility] for tasks assessed/performed       Past Medical History:  Diagnosis Date  . Arthritis   . CVA (cerebral vascular accident) ([place])   . Depression   . GERD (gastroesophageal reflux disease)   . Hypertension     Past Surgical History:  Procedure Laterality Date  . CHOLECYSTECTOMY N/A 08/28/2015   Procedure: LAPAROSCOPIC CHOLECYSTECTOMY;  Surgeon: Fanny Skates, MD;  Location: [place];  Service: General;  Laterality: N/A;  . HIATAL HERNIA REPAIR    . WISDOM TOOTH EXTRACTION      There were no vitals filed for this visit.  Subjective Assessment - 09/13/18 1340    Subjective  Pt now a 3-4/10 today. but I was hurting so bad the whole weekend about 7/10 after the hands on RX last time.      Pertinent History  CVA 2012,  GERD, depression, See medical history    Limitations  Sitting;Standing;House hold activities    Diagnostic tests  no xrays    Patient Stated Goals  Get rid of pain    Currently in Pain?  Yes    Pain Score  4     Pain Location  Thoracic    Pain Orientation  Right    Pain Descriptors / Indicators  Tightness;Aching;Spasm    Pain Type  Acute pain    Pain Onset  More than a month ago    Pain Frequency  Intermittent                       [facility] Adult PT  Treatment/Exercise - 09/13/18 0001      Neck Exercises: Supine   Other Supine Exercise  chin tucks 10 x 10 seconds      Lumbar Exercises: Standing   Other Standing Lumbar Exercises  shoulder wall slides with wash cloths. 10 x each side      Shoulder Exercises: Prone   Other Prone Exercises  serratus push up on elbows x 15    Other Prone Exercises  supine scapular stabilzers. shld flex, horizontal abd, diagonals and ER bil x 15 each with yeallow t band.        Shoulder Exercises: Stretch   Corner Stretch  3 reps;20 seconds    Corner Stretch Limitations  right arm with pain at 90 degrees in right scapula so modifited      Manual Therapy   Manual Therapy  Soft tissue mobilization    Manual therapy comments  tissue softened.     Joint Mobilization  gentle grade 1/2 over cervical thoracic    initial sharp pain to medium   Soft tissue mobilization  bil upper traps/ levator , rhomboids.  cervical/ thoracic paraspinals       Trigger Point Dry Needling - 09/13/18 1438    Consent Given?  Yes    Education Handout Provided  No   previously given   Muscles Treated Upper Body  Upper trapezius;Levator scapulae;Rhomboids;Longissimus   rigth side only   Upper Trapezius Response  Twitch reponse elicited;Palpable increased muscle length    Levator Scapulae Response  Twitch response elicited;Palpable increased muscle length    Rhomboids Response  Twitch response elicited;Palpable increased muscle length    Longissimus Response  Twitch response elicited;Palpable increased muscle length   T-2 to T8 right side          PT Education - 09/13/18 1355    Education Details  added to HEP supine scapular stabilizers and book opening exercise    Methods  Explanation;Demonstration;Tactile cues;Verbal cues;Handout    Comprehension  Verbalized understanding;Returned demonstration       PT Short Term Goals - 09/13/18 1440      PT SHORT TERM GOAL #1   Title  Pt will be independent with initial HEP     Baseline  pt given HEP for initial exercise    Time  2    Period  Weeks    Status  On-going      PT SHORT TERM GOAL #2   Title  She will report incidents of pain decreased 50% or more in intensity or frequency while walking and standing to do chores    Baseline  4/10 today but 7/10 over the weekend after last visit    Time  2    Period  Weeks    Status  On-going        PT Long Term Goals - 08/30/18 1139      PT LONG TERM GOAL #1   Title  Pt will be independent in advanced HEP    Baseline  Pt with decreased AROM for exercise on eval. /and muscle weakness    Time  6    Period  Weeks    Status  New    Target Date  10/11/18      PT LONG TERM GOAL #2   Title  She will report no incidents of pain for at least 10 days    Baseline  Pt with daily right scapular pain    Time  6    Period  Weeks    Status  New      PT LONG TERM GOAL #3   Title  She will report generally minimal tenderness in paraspinals /medial scapula on RT     Baseline  Pt with 7/10 pain on medial paraspinal    Time  6    Period  Weeks    Status  New      PT LONG TERM GOAL #4   Title  Pt will be able to walk for 30 to 45 minutes with minimal right thoracic/scapular pain    Baseline  7/10 pain on eval    Time  6    Period  Weeks    Status  New      PT LONG TERM GOAL #5   Title  Pt will be able to perform 5 x sts in 14 sec or under to show increased  LE strength and control for improved balance for walking    Baseline  Pt with 18. 26  sec in 5 x sts and uses momentum to come to standing on eval    Time  6  Period  Weeks    Status  New      PT LONG TERM GOAL #6   Title  "FOTO will improve from 53%limitation   to 37% limitation    indicating improved functional mobility .     Time  6    Period  Weeks    Status  New    Target Date  10/11/18      PT LONG TERM GOAL #7   Title  Pt will be able to walk at least at community level 2.60ft/sed for safe ambulation    Time  6    Period  Weeks    Status   New    Target Date  10/11/18            Plan - 09/13/18 1441    Clinical Impression Statement  Pt came to clinic with complaints of increased pain over the weekend after last session.  Pt consented to TPDN and was closely monitored.  Pt 4/10 at beginning and 2/10 after RX today.  Pt has right UE pain with horizontal abduction. Pt taught HEP in pain free range . Will continue to progress as tolerated    Rehab Potential  Good    PT Frequency  2x / week    PT Duration  6 weeks    PT Treatment/Interventions  ADLs/Self Care Home Management;Cryotherapy;Electrical Stimulation;Iontophoresis 4mg /ml Dexamethasone;Moist Heat;Ultrasound;Therapeutic exercise;Therapeutic activities;Functional mobility training;Gait training;Neuromuscular re-education;Patient/family education;Manual techniques;Passive range of motion;Dry needling;Taping;Joint Manipulations    PT Next Visit Plan  basic back and thoracic extension.     PT Home Exercise Plan  thoracic extensions,  decompression series    Consulted and Agree with Plan of Care  Patient       Patient will benefit from skilled therapeutic intervention in order to improve the following deficits and impairments:  Difficulty walking, Decreased activity tolerance, Decreased mobility, Decreased range of motion, Decreased strength, Hypomobility, Increased muscle spasms, Pain, Postural dysfunction, Improper body mechanics  Visit Diagnosis: Pain in thoracic spine  Abnormal posture  Muscle weakness (generalized)  Other abnormalities of gait and mobility     Problem List Patient Active Problem List   Diagnosis Date Noted  . 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 CVA (cerebral vascular accident) ([place])     [name], PT Certified Exercise Expert for the Aging Adult  09/13/18 2:46 PM Phone: [phone] Fax: [place] [facility] [address] [place], [place], [address] Phone: [phone]   Fax:  [phone]  Name: [name] MRN: [id number] Date of Birth: 1941/05/31

## 2018-09-13 NOTE — Patient Instructions (Signed)
Over Head Pull: Narrow Grip       On back, knees bent, feet flat, band across thighs, elbows straight but relaxed. Pull hands apart (start). Keeping elbows straight, bring arms up and over head, hands toward floor. Keep pull steady on band. Hold momentarily. Return slowly, keeping pull steady, back to start. Repeat 15___ times. Band color __yellow____   Side Pull: Double Arm   On back, knees bent, feet flat. Arms perpendicular to body, shoulder level, elbows straight but relaxed. Pull arms out to sides, elbows straight. Resistance band comes across collarbones, hands toward floor. Hold momentarily. Slowly return to starting position. Repeat __15_ times. Band color _yellow1____   Sash   On back, knees bent, feet flat, left hand on left hip, right hand above left. Pull right arm DIAGONALLY (hip to shoulder) across chest. Bring right arm along head toward floor. Hold momentarily. Slowly return to starting position. Repeat 15___ times. Do with left arm. Band color __yellow____   Shoulder Rotation: Double Arm   On back, knees bent, feet flat, elbows tucked at sides, bent 90, hands palms up. Pull hands apart and down toward floor, keeping elbows near sides. Hold momentarily. Slowly return to starting position. Repeat _15__ times. Band color _yellow_____     [name], PT Certified Exercise Expert for the Aging Adult  09/13/18 1:53 PM Phone: [phone] Fax: [phone]

## 2018-09-15 ENCOUNTER — Encounter: Payer: Self-pay | Admitting: Physical Therapy

## 2018-09-15 ENCOUNTER — Ambulatory Visit: Payer: Medicare Other | Admitting: Physical Therapy

## 2018-09-15 DIAGNOSIS — R293 Abnormal posture: Secondary | ICD-10-CM | POA: Diagnosis not present

## 2018-09-15 DIAGNOSIS — M6281 Muscle weakness (generalized): Secondary | ICD-10-CM | POA: Diagnosis not present

## 2018-09-15 DIAGNOSIS — R2689 Other abnormalities of gait and mobility: Secondary | ICD-10-CM | POA: Diagnosis not present

## 2018-09-15 DIAGNOSIS — M546 Pain in thoracic spine: Secondary | ICD-10-CM

## 2018-09-15 NOTE — Patient Instructions (Signed)
      [name], PT Certified Exercise Expert for the Aging Adult  09/15/18 11:21 AM Phone: [phone] Fax: [phone]

## 2018-09-15 NOTE — Therapy (Signed)
[place], [place], [address] Phone: [phone]   Fax:  [phone]  Physical Therapy Treatment  Patient Details  Name: [name] MRN: [id number] Date of Birth: [birth year] Referring Provider (PT): Josetta Huddle MD   Encounter Date: 09/15/2018  PT End of Session - 09/15/18 1125    Visit Number  4    Number of Visits  12    Date for PT Re-Evaluation  10/11/18    Authorization Type  medicare KX modifier at 15 and progressnote at 10th visit    PT Start Time  1100    PT Stop Time  1155    PT Time Calculation (min)  55 min    Activity Tolerance  Patient tolerated treatment well    Behavior During Therapy  [facility] for tasks assessed/performed       Past Medical History:  Diagnosis Date  . Arthritis   . CVA (cerebral vascular accident) ([place])   . Depression   . GERD (gastroesophageal reflux disease)   . Hypertension     Past Surgical History:  Procedure Laterality Date  . CHOLECYSTECTOMY N/A 08/28/2015   Procedure: LAPAROSCOPIC CHOLECYSTECTOMY;  Surgeon: Fanny Skates, MD;  Location: [place];  Service: General;  Laterality: N/A;  . HIATAL HERNIA REPAIR    . WISDOM TOOTH EXTRACTION      There were no vitals filed for this visit.  Subjective Assessment - 09/15/18 1125    Subjective  Pt states she has no pain today except for when PT palpates     Pertinent History  CVA 2012,  GERD, depression, See medical history    Patient Stated Goals  Get rid of pain    Pain Score  0-No pain    Pain Location  Thoracic    Pain Descriptors / Indicators  Tightness    Pain Type  Chronic pain    Pain Frequency  Intermittent                       [facility] Adult PT Treatment/Exercise - 09/15/18 1145      Self-Care   Self-Care  Other Self-Care Comments    Other Self-Care Comments   self myofascial fo r sub scapularis trigger point and horizontal shoulder stretch on Right x 10       Neck  Exercises: Supine   Other Supine Exercise  chin tucks 10 x 10 seconds, supine stretch with bil elbows ER overhead 3 x 20 seconds       Lumbar Exercises: Standing   Other Standing Lumbar Exercises  shoulder with yellow t band on wall to Y, T and W,  IR 2 x 15 with yellow t band       Shoulder Exercises: Prone   Other Prone Exercises  serratus push up on elbows x 15    Other Prone Exercises  supine scapular stabilzers. shld flex, horizontal abd, diagonals and ER bil x 15 each with yeallow t band.        Shoulder Exercises: Stretch   Corner Stretch Limitations  seated Bil ER with red t band x2 x 15       Moist Heat Therapy   Number Minutes Moist Heat  15 Minutes    Moist Heat Location  Lumbar Spine   thoracic     Manual Therapy   Manual Therapy  Soft tissue mobilization    Manual therapy comments  skilled palpation  with TPDN    Joint  Mobilization  gentle grade 1/2 over cervical thoracic     Soft tissue mobilization  bil upper traps/ levator , rhomboids. cervical/ thoracic paraspinals, right sub scapularis        Trigger Point Dry Needling - 09/15/18 1126    Consent Given?  Yes    Education Handout Provided  No   previously given   Muscles Treated Upper Body  Upper trapezius;Subscapularis;Levator scapulae;Rhomboids   R side only   Upper Trapezius Response  Twitch reponse elicited;Palpable increased muscle length    Levator Scapulae Response  Twitch response elicited;Palpable increased muscle length    Rhomboids Response  Twitch response elicited;Palpable increased muscle length    Subscapularis Response  Twitch response elicited;Palpable increased muscle length    Longissimus Response  Palpable increased muscle length   T-2 to T10          PT Education - 09/15/18 1121    Education Details  added IR and ER exercise and     Person(s) Educated  Patient    Methods  Explanation;Demonstration;Tactile cues;Verbal cues    Comprehension  Verbalized understanding;Returned  demonstration       PT Short Term Goals - 09/13/18 1440      PT SHORT TERM GOAL #1   Title  Pt will be independent with initial HEP    Baseline  pt given HEP for initial exercise    Time  2    Period  Weeks    Status  On-going      PT SHORT TERM GOAL #2   Title  She will report incidents of pain decreased 50% or more in intensity or frequency while walking and standing to do chores    Baseline  4/10 today but 7/10 over the weekend after last visit    Time  2    Period  Weeks    Status  On-going        PT Long Term Goals - 08/30/18 1139      PT LONG TERM GOAL #1   Title  Pt will be independent in advanced HEP    Baseline  Pt with decreased AROM for exercise on eval. /and muscle weakness    Time  6    Period  Weeks    Status  New    Target Date  10/11/18      PT LONG TERM GOAL #2   Title  She will report no incidents of pain for at least 10 days    Baseline  Pt with daily right scapular pain    Time  6    Period  Weeks    Status  New      PT LONG TERM GOAL #3   Title  She will report generally minimal tenderness in paraspinals /medial scapula on RT     Baseline  Pt with 7/10 pain on medial paraspinal    Time  6    Period  Weeks    Status  New      PT LONG TERM GOAL #4   Title  Pt will be able to walk for 30 to 45 minutes with minimal right thoracic/scapular pain    Baseline  7/10 pain on eval    Time  6    Period  Weeks    Status  New      PT LONG TERM GOAL #5   Title  Pt will be able to perform 5 x sts in 14 sec or under to show increased  LE  strength and control for improved balance for walking    Baseline  Pt with 18. 26  sec in 5 x sts and uses momentum to come to standing on eval    Time  6    Period  Weeks    Status  New      PT LONG TERM GOAL #6   Title  "FOTO will improve from 53%limitation   to 37% limitation    indicating improved functional mobility .     Time  6    Period  Weeks    Status  New    Target Date  10/11/18      PT LONG TERM  GOAL #7   Title  Pt will be able to walk at least at community level 2.16ft/sed for safe ambulation    Time  6    Period  Weeks    Status  New    Target Date  10/11/18            Plan - 09/15/18 1151    Clinical Impression Statement  Pt comes to clinic with no complaint of pain after last session. Pt with only pain with palpation over right subscapularis and along right thoracic and cervical paraspinals minimal today.  Pt educated today on  self myofascial release with tennis ball and was able to horizontall adductio right shoulder with decreased pain    Rehab Potential  Good    PT Frequency  2x / week    PT Duration  6 weeks    PT Treatment/Interventions  ADLs/Self Care Home Management;Cryotherapy;Electrical Stimulation;Iontophoresis 4mg /ml Dexamethasone;Moist Heat;Ultrasound;Therapeutic exercise;Therapeutic activities;Functional mobility training;Gait training;Neuromuscular re-education;Patient/family education;Manual techniques;Passive range of motion;Dry needling;Taping;Joint Manipulations    PT Next Visit Plan  basic back and thoracic extension.     PT Home Exercise Plan  thoracic extensions,  decompression series    Consulted and Agree with Plan of Care  Patient       Patient will benefit from skilled therapeutic intervention in order to improve the following deficits and impairments:  Difficulty walking, Decreased activity tolerance, Decreased mobility, Decreased range of motion, Decreased strength, Hypomobility, Increased muscle spasms, Pain, Postural dysfunction, Improper body mechanics  Visit Diagnosis: Pain in thoracic spine  Abnormal posture  Muscle weakness (generalized)  Other abnormalities of gait and mobility     Problem List Patient Active Problem List   Diagnosis Date Noted  . 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 CVA (cerebral vascular accident) ([place])     [name], PT Certified Exercise Expert for  the Aging Adult  09/15/18 11:55 AM Phone: [phone] Fax: [place] [facility] [address] [place], [place], [address] Phone: [phone]   Fax:  [phone]  Name: [name] MRN: [id number] Date of Birth: 1941/09/01

## 2018-09-16 ENCOUNTER — Encounter

## 2018-09-20 ENCOUNTER — Ambulatory Visit: Payer: Medicare Other | Attending: Internal Medicine | Admitting: Physical Therapy

## 2018-09-20 ENCOUNTER — Encounter: Payer: Self-pay | Admitting: Physical Therapy

## 2018-09-20 DIAGNOSIS — R293 Abnormal posture: Secondary | ICD-10-CM | POA: Diagnosis not present

## 2018-09-20 DIAGNOSIS — M546 Pain in thoracic spine: Secondary | ICD-10-CM | POA: Diagnosis not present

## 2018-09-20 DIAGNOSIS — M6281 Muscle weakness (generalized): Secondary | ICD-10-CM | POA: Insufficient documentation

## 2018-09-20 DIAGNOSIS — R2689 Other abnormalities of gait and mobility: Secondary | ICD-10-CM | POA: Insufficient documentation

## 2018-09-20 NOTE — Patient Instructions (Addendum)

## 2018-09-20 NOTE — Therapy (Signed)
[place], [place], [address] Phone: [phone]   Fax:  [phone]  Physical Therapy Treatment  Patient Details  Name: [name] MRN: [id number] Date of Birth: June 25, 1941 Referring Provider (PT): Josetta Huddle MD   Encounter Date: 09/20/2018  PT End of Session - 09/20/18 1302    Visit Number  5    Number of Visits  12    Date for PT Re-Evaluation  10/11/18    PT Start Time  1016    PT Stop Time  1100    PT Time Calculation (min)  44 min    Activity Tolerance  Patient tolerated treatment well    Behavior During Therapy  [facility] for tasks assessed/performed       Past Medical History:  Diagnosis Date  . Arthritis   . CVA (cerebral vascular accident) ([place])   . Depression   . GERD (gastroesophageal reflux disease)   . Hypertension     Past Surgical History:  Procedure Laterality Date  . CHOLECYSTECTOMY N/A 08/28/2015   Procedure: LAPAROSCOPIC CHOLECYSTECTOMY;  Surgeon: Fanny Skates, MD;  Location: [place];  Service: General;  Laterality: N/A;  . HIATAL HERNIA REPAIR    . WISDOM TOOTH EXTRACTION      There were no vitals filed for this visit.  Subjective Assessment - 09/20/18 1020    Subjective  this past weekend , my right shoulder felt really bad.  like 9/10  Now I feel I have no pain    Pertinent History  CVA 2012,  GERD, depression, See medical history    Limitations  Sitting;Standing;House hold activities    Currently in Pain?  No/denies    Pain Score  0-No pain    Pain Location  Thoracic    Pain Orientation  Right    Pain Descriptors / Indicators  Tightness    Pain Type  Chronic pain    Pain Onset  More than a month ago    Pain Frequency  Intermittent    Pain Score  0   but 9/10 over the weekend   Pain Location  Shoulder    Pain Orientation  Right    Pain Descriptors / Indicators  Aching;Sharp    Pain Type  Acute pain    Pain Onset  In the past 7 days                        [facility] Adult PT Treatment/Exercise - 09/20/18 1028      Neck Exercises: Supine   Other Supine Exercise  chin tucks 10 sec and 10 x      Lumbar Exercises: Standing   Other Standing Lumbar Exercises  yellow t band bil shoulder flexion by hold t band with bil feel and fleixon to 70 degrees only due to patient tolerance    3 x 10     Shoulder Exercises: Supine   Other Supine Exercises  use of pink foam roller for thoracic extension x 3 at various level in thoracic region.  book openeing x 10 to right and left      Shoulder Exercises: Prone   Other Prone Exercises  serratus push up on elbows x 15    Other Prone Exercises  prone shoulder ext, horizontal abd and W's bil 3 x 10  each       Shoulder Exercises: Stretch   Corner Stretch Limitations  seated Bil ER with red t band x2 x 15  Iontophoresis   Type of Iontophoresis  Dexamethasone    Location  right rhomboid    Dose  1cc patch to be removed in 4-6 hours    Time  patch to be removed  in 4-6 hours      Manual Therapy   Manual Therapy  Soft tissue mobilization    Joint Mobilization  scapular mobiliazation or R scapula ,     Soft tissue mobilization  bil upper traps/ levator , rhomboids. cervical/ thoracic paraspinals, right sub scapularis              PT Education - 09/20/18 1042    Education Details  addded prone shoulder/ rhomboid and standing bil shoulder flex with yellow t band, iontophoresis education    Person(s) Educated  Patient    Methods  Explanation;Demonstration;Tactile cues;Verbal cues;Handout    Comprehension  Verbalized understanding;Returned demonstration       PT Short Term Goals - 09/13/18 1440      PT SHORT TERM GOAL #1   Title  Pt will be independent with initial HEP    Baseline  pt given HEP for initial exercise    Time  2    Period  Weeks    Status  On-going      PT SHORT TERM GOAL #2   Title  She will report incidents of pain decreased 50% or more in  intensity or frequency while walking and standing to do chores    Baseline  4/10 today but 7/10 over the weekend after last visit    Time  2    Period  Weeks    Status  On-going        PT Long Term Goals - 08/30/18 1139      PT LONG TERM GOAL #1   Title  Pt will be independent in advanced HEP    Baseline  Pt with decreased AROM for exercise on eval. /and muscle weakness    Time  6    Period  Weeks    Status  New    Target Date  10/11/18      PT LONG TERM GOAL #2   Title  She will report no incidents of pain for at least 10 days    Baseline  Pt with daily right scapular pain    Time  6    Period  Weeks    Status  New      PT LONG TERM GOAL #3   Title  She will report generally minimal tenderness in paraspinals /medial scapula on RT     Baseline  Pt with 7/10 pain on medial paraspinal    Time  6    Period  Weeks    Status  New      PT LONG TERM GOAL #4   Title  Pt will be able to walk for 30 to 45 minutes with minimal right thoracic/scapular pain    Baseline  7/10 pain on eval    Time  6    Period  Weeks    Status  New      PT LONG TERM GOAL #5   Title  Pt will be able to perform 5 x sts in 14 sec or under to show increased  LE strength and control for improved balance for walking    Baseline  Pt with 18. 26  sec in 5 x sts and uses momentum to come to standing on eval    Time  6  Period  Weeks    Status  New      PT LONG TERM GOAL #6   Title  "FOTO will improve from 53%limitation   to 37% limitation    indicating improved functional mobility .     Time  6    Period  Weeks    Status  New    Target Date  10/11/18      PT LONG TERM GOAL #7   Title  Pt will be able to walk at least at community level 2.73ft/sed for safe ambulation    Time  6    Period  Weeks    Status  New    Target Date  10/11/18            Plan - 09/20/18 1259    Clinical Impression Statement  Pt declinied TPDN today and worked more on exercises and STW.  Pt reports 0/10 pain except  at end range of shoulder abd/flexion in standing.  Pt still with trigger point pain over rib junction right 5/6/7  Pt with marked pain response with tissues over 6th rib.  Pt consented to using iontophoresis patch and placed for 4-6 hours dosage.  Pt has problems bending wrists and modified exercises for pt.  Pt wiill benefit from continuing strengthening to maximize function       Patient will benefit from skilled therapeutic intervention in order to improve the following deficits and impairments:     Visit Diagnosis: Pain in thoracic spine  Abnormal posture  Muscle weakness (generalized)  Other abnormalities of gait and mobility     Problem List Patient Active Problem List   Diagnosis Date Noted  . 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 CVA (cerebral vascular accident) [facility])     Dorothea Ogle 09/20/2018, 1:05 PM  [place], [place], [address] Phone: [phone]   Fax:  [phone]  Name: [name] MRN: [id number] Date of Birth: [birth year]

## 2018-09-21 DIAGNOSIS — M79644 Pain in right finger(s): Secondary | ICD-10-CM | POA: Diagnosis not present

## 2018-09-22 ENCOUNTER — Encounter: Payer: Self-pay | Admitting: Physical Therapy

## 2018-09-22 ENCOUNTER — Ambulatory Visit: Payer: Medicare Other | Admitting: Physical Therapy

## 2018-09-22 DIAGNOSIS — R293 Abnormal posture: Secondary | ICD-10-CM | POA: Diagnosis not present

## 2018-09-22 DIAGNOSIS — M6281 Muscle weakness (generalized): Secondary | ICD-10-CM

## 2018-09-22 DIAGNOSIS — M546 Pain in thoracic spine: Secondary | ICD-10-CM | POA: Diagnosis not present

## 2018-09-22 DIAGNOSIS — R2689 Other abnormalities of gait and mobility: Secondary | ICD-10-CM

## 2018-09-22 NOTE — Patient Instructions (Signed)
     Mini squats at sink with chair  10 x 2 holding onto sink.    Stretch out strap on [place]  , Remember to place around chest and then x in the back and pull forward     [name], PT Certified Exercise Expert for the Aging Adult  09/22/18 11:43 AM Phone: [phone] Fax: [phone]

## 2018-09-22 NOTE — Therapy (Signed)
[place], [place], [address] Phone: [phone]   Fax:  [phone]  Physical Therapy Treatment  Patient Details  Name: [name] MRN: [id number] Date of Birth: Feb 02, 1941 Referring Provider (PT): Josetta Huddle MD   Encounter Date: 09/22/2018  PT End of Session - 09/22/18 1110    Visit Number  6    Number of Visits  12    Date for PT Re-Evaluation  10/11/18    PT Start Time  1102    PT Stop Time  1150    PT Time Calculation (min)  48 min    Activity Tolerance  Patient tolerated treatment well    Behavior During Therapy  [facility] for tasks assessed/performed       Past Medical History:  Diagnosis Date  . Arthritis   . CVA (cerebral vascular accident) ([place])   . Depression   . GERD (gastroesophageal reflux disease)   . Hypertension     Past Surgical History:  Procedure Laterality Date  . CHOLECYSTECTOMY N/A 08/28/2015   Procedure: LAPAROSCOPIC CHOLECYSTECTOMY;  Surgeon: Fanny Skates, MD;  Location: [place];  Service: General;  Laterality: N/A;  . HIATAL HERNIA REPAIR    . WISDOM TOOTH EXTRACTION      There were no vitals filed for this visit.  Subjective Assessment - 09/22/18 1109    Subjective  I am a 3/10 pain and I dont want TPDN. I am able to do my prone exercises    Pertinent History  CVA 2012,  GERD, depression, See medical history    Limitations  Sitting;Standing;House hold activities    Diagnostic tests  no xrays    Patient Stated Goals  Get rid of pain    Currently in Pain?  Yes    Pain Score  3     Pain Location  Thoracic    Pain Orientation  Right    Pain Descriptors / Indicators  Aching    Pain Type  Chronic pain    Pain Onset  More than a month ago    Pain Frequency  Constant                       [facility] Adult PT Treatment/Exercise - 09/22/18 0001      Self-Care   Self-Care  Other Self-Care Comments    Other Self-Care Comments   education of  condtion using skeletal model      Neck Exercises: Standing   Other Standing Exercises  standing walk push up x 15       Neck Exercises: Supine   Other Supine Exercise  chin tucks 10 sec and 10 x   also chin tuck with neck flexion x 10     Lumbar Exercises: Standing   Other Standing Lumbar Exercises  sit to stand 15 x     Other Standing Lumbar Exercises  red t band bil shoulder flexion by hold t band with bil feel and fleixon to 70 degrees only due to patient tolerance    3 x 10     Shoulder Exercises: Prone   Other Prone Exercises  prone shoulder ext, horizontal abd and W's bil 3 x 10  each       Shoulder Exercises: Standing   Other Standing Exercises  thoracic stretch with PA glide using strap for self mob in chair and with strap    Other Standing Exercises  stretch out strap kyphosis stretch x 3 with 10  sec hold   feels good to pt     Shoulder Exercises: Stretch   Corner Stretch Limitations  seated Bil ER with red t band x2 x 15       Manual Therapy   Manual Therapy  Soft tissue mobilization;Joint mobilization    Manual therapy comments  MET for rib junction with shoulder resisted abd and extension    Joint Mobilization  thoracic springing over right rib junction and spinous process thoraici             PT Education - 09/22/18 1143    Education Details  Added thoracic extension and stretch and added strength to HEP education with skeleton on anatomy    Person(s) Educated  Patient    Methods  Explanation;Demonstration;Tactile cues;Verbal cues;Handout    Comprehension  Verbalized understanding;Returned demonstration       PT Short Term Goals - 09/13/18 1440      PT SHORT TERM GOAL #1   Title  Pt will be independent with initial HEP    Baseline  pt given HEP for initial exercise    Time  2    Period  Weeks    Status  On-going      PT SHORT TERM GOAL #2   Title  She will report incidents of pain decreased 50% or more in intensity or frequency while walking and  standing to do chores    Baseline  4/10 today but 7/10 over the weekend after last visit    Time  2    Period  Weeks    Status  On-going        PT Long Term Goals - 08/30/18 1139      PT LONG TERM GOAL #1   Title  Pt will be independent in advanced HEP    Baseline  Pt with decreased AROM for exercise on eval. /and muscle weakness    Time  6    Period  Weeks    Status  New    Target Date  10/11/18      PT LONG TERM GOAL #2   Title  She will report no incidents of pain for at least 10 days    Baseline  Pt with daily right scapular pain    Time  6    Period  Weeks    Status  New      PT LONG TERM GOAL #3   Title  She will report generally minimal tenderness in paraspinals /medial scapula on RT     Baseline  Pt with 7/10 pain on medial paraspinal    Time  6    Period  Weeks    Status  New      PT LONG TERM GOAL #4   Title  Pt will be able to walk for 30 to 45 minutes with minimal right thoracic/scapular pain    Baseline  7/10 pain on eval    Time  6    Period  Weeks    Status  New      PT LONG TERM GOAL #5   Title  Pt will be able to perform 5 x sts in 14 sec or under to show increased  LE strength and control for improved balance for walking    Baseline  Pt with 18. 26  sec in 5 x sts and uses momentum to come to standing on eval    Time  6    Period  Weeks    Status  New      PT LONG TERM GOAL #6   Title  "FOTO will improve from 53%limitation   to 37% limitation    indicating improved functional mobility .     Time  6    Period  Weeks    Status  New    Target Date  10/11/18      PT LONG TERM GOAL #7   Title  Pt will be able to walk at least at community level 2.88f/sed for safe ambulation    Time  6    Period  Weeks    Status  New    Target Date  10/11/18            Plan - 09/22/18 1211    Clinical Impression Statement  Pt comes into clinic with 3/10 right 5/6/7 rib junction pain.  Pt with exericise and manual springing improved to 1/10 at end of  session.  Pt declined TPDN today.  Pt given education and HEP for thoracic stretching and will continue to progress toward completion of LTG and independent with HEP. Pt may benefit form Pilates for overall stretnghthening of core    Rehab Potential  Good    PT Frequency  2x / week    PT Duration  6 weeks    PT Treatment/Interventions  ADLs/Self Care Home Management;Cryotherapy;Electrical Stimulation;Iontophoresis 431mml Dexamethasone;Moist Heat;Ultrasound;Therapeutic exercise;Therapeutic activities;Functional mobility training;Gait training;Neuromuscular re-education;Patient/family education;Manual techniques;Passive range of motion;Dry needling;Taping;Joint Manipulations    PT Next Visit Plan  basic back and thoracic extension.  Maybe pilates for future appt    PT Home Exercise Plan  thoracic extensions,  decompression series sit to stand.  stretch out strap stretch of thoracic kyphosis       Patient will benefit from skilled therapeutic intervention in order to improve the following deficits and impairments:  Difficulty walking, Decreased activity tolerance, Decreased mobility, Decreased range of motion, Decreased strength, Hypomobility, Increased muscle spasms, Pain, Postural dysfunction, Improper body mechanics  Visit Diagnosis: Pain in thoracic spine  Abnormal posture  Muscle weakness (generalized)  Other abnormalities of gait and mobility     Problem List Patient Active Problem List   Diagnosis Date Noted  . 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 CVA (cerebral vascular accident) ([facility]  LaVoncille LoPT Certified Exercise Expert for the Aging Adult  09/22/18 1:31 PM Phone: [phone]: 33[facility] [address]rLonepine[place]hone: [phone] Fax:  [phone]: Elizabeth CAMARENARN: [id number] of Birth: 9/July 28, 1941

## 2018-09-27 ENCOUNTER — Ambulatory Visit: Payer: Medicare Other | Admitting: Physical Therapy

## 2018-09-27 DIAGNOSIS — R293 Abnormal posture: Secondary | ICD-10-CM

## 2018-09-27 DIAGNOSIS — M6281 Muscle weakness (generalized): Secondary | ICD-10-CM | POA: Diagnosis not present

## 2018-09-27 DIAGNOSIS — M546 Pain in thoracic spine: Secondary | ICD-10-CM

## 2018-09-27 DIAGNOSIS — R2689 Other abnormalities of gait and mobility: Secondary | ICD-10-CM | POA: Diagnosis not present

## 2018-09-27 NOTE — Patient Instructions (Signed)
 .  bridgingBridging (Buttocks / Hamstring Stretch)    Lie on back, knees bent, feet flat on surface. Breathe in. Raise buttocks and hold position _3__ seconds, breathing out through pursed lips. Return to start position, breathing in. Remember: do not hold your breath. Repeat 10 x 2___ times. Do _1__ sessions per day. Variation: Lie on bed, not on floor.    HIP: Abduction / External Rotation (Band)   Place band around knees. Lie on side with hips and knees bent. Raise top knee up, squeezing glutes. Keep feet together. Hold _3__ seconds. Use ___no band initially_____ band. 10___ reps per set,2___ sets per day, _7__ days per week  [name], PT Certified Exercise Expert for the Aging Adult  09/27/18 10:45 AM Phone: [phone] Fax: [phone]

## 2018-09-27 NOTE — Therapy (Signed)
[place], [place], [address] Phone: [phone]   Fax:  [phone]  Physical Therapy Treatment  Patient Details  Name: [name] MRN: [id number] Date of Birth: 1941-09-13 Referring Provider (PT): Josetta Huddle MD   Encounter Date: 09/27/2018  PT End of Session - 09/27/18 1026    Visit Number  7    Number of Visits  12    Date for PT Re-Evaluation  10/11/18    Authorization Type  medicare KX modifier at 51 and progressnote at 10th visit    PT Start Time  1025   came late to clinic   PT Stop Time  1100    PT Time Calculation (min)  35 min    Activity Tolerance  Patient tolerated treatment well    Behavior During Therapy  [facility] for tasks assessed/performed       Past Medical History:  Diagnosis Date  . Arthritis   . CVA (cerebral vascular accident) ([place])   . Depression   . GERD (gastroesophageal reflux disease)   . Hypertension     Past Surgical History:  Procedure Laterality Date  . CHOLECYSTECTOMY N/A 08/28/2015   Procedure: LAPAROSCOPIC CHOLECYSTECTOMY;  Surgeon: Fanny Skates, MD;  Location: [place];  Service: General;  Laterality: N/A;  . HIATAL HERNIA REPAIR    . WISDOM TOOTH EXTRACTION      There were no vitals filed for this visit.  Subjective Assessment - 09/27/18 1027    Subjective  I am a 1/10 today in pain but I was very sore. but yesterday I had such bad pain that I took pain medication and I couldnt do any exercises    Pertinent History  CVA 2012,  GERD, depression, See medical history    Limitations  Sitting;Standing;House hold activities    How long can you sit comfortably?  30 minutes maximum    How long can you stand comfortably?  30 min max    How long can you walk comfortably?  30 to 45 min     Diagnostic tests  no xrays    Patient Stated Goals  Get rid of pain    Currently in Pain?  Yes    Pain Score  1     Pain Location  Thoracic    Pain Descriptors /  Indicators  Aching    Pain Type  Chronic pain                       [facility] Adult PT Treatment/Exercise - 09/27/18 0001      Lumbar Exercises: Supine   Bridge  10 reps      Lumbar Exercises: Sidelying   Other Sidelying Lumbar Exercises  clams without t band 10 x 1 right and left      Shoulder Exercises: Standing   Other Standing Exercises  thoracic stretch with PA glide using strap for self mob in chair and with strap    Other Standing Exercises  standing yellow t band ER with UE flexion 10 x 2      Shoulder Exercises: Stretch   Corner Stretch Limitations  seated Bil ER with red t band x2 x 15       Manual Therapy   Manual Therapy  Soft tissue mobilization;Joint mobilization    Manual therapy comments  MET for rib junction with shoulder resisted abd and extension.  diaphragmatic breating 20 deep breaths with approximation of chest  for input.  Joint Mobilization  thoracic springing over right rib junction and spinous process thoraici    Soft tissue mobilization  IASTYM tool to right thoracic and paraspinals             PT Education - 09/27/18 1258    Education Details  added serratus with yellow t band UE flexion/ to HEP    Person(s) Educated  Patient    Methods  Explanation;Demonstration;Tactile cues;Verbal cues;Handout    Comprehension  Verbalized understanding;Returned demonstration       PT Short Term Goals - 09/27/18 1027      PT SHORT TERM GOAL #1   Title  Pt will be independent with initial HEP    Baseline  Indpenedent    Time  2    Period  Weeks    Status  Achieved      PT SHORT TERM GOAL #2   Title  She will report incidents of pain decreased 50% or more in intensity or frequency while walking and standing to do chores    Baseline  1/10 today and is 50% better than on eval and can stand 30 minutes    Time  2    Period  Weeks    Status  Achieved        PT Long Term Goals - 08/30/18 1139      PT LONG TERM GOAL #1   Title  Pt will  be independent in advanced HEP    Baseline  Pt with decreased AROM for exercise on eval. /and muscle weakness    Time  6    Period  Weeks    Status  New    Target Date  10/11/18      PT LONG TERM GOAL #2   Title  She will report no incidents of pain for at least 10 days    Baseline  Pt with daily right scapular pain    Time  6    Period  Weeks    Status  New      PT LONG TERM GOAL #3   Title  She will report generally minimal tenderness in paraspinals /medial scapula on RT     Baseline  Pt with 7/10 pain on medial paraspinal    Time  6    Period  Weeks    Status  New      PT LONG TERM GOAL #4   Title  Pt will be able to walk for 30 to 45 minutes with minimal right thoracic/scapular pain    Baseline  7/10 pain on eval    Time  6    Period  Weeks    Status  New      PT LONG TERM GOAL #5   Title  Pt will be able to perform 5 x sts in 14 sec or under to show increased  LE strength and control for improved balance for walking    Baseline  Pt with 18. 26  sec in 5 x sts and uses momentum to come to standing on eval    Time  6    Period  Weeks    Status  New      PT LONG TERM GOAL #6   Title  "FOTO will improve from 53%limitation   to 37% limitation    indicating improved functional mobility .     Time  6    Period  Weeks    Status  New    Target Date  10/11/18  PT LONG TERM GOAL #7   Title  Pt will be able to walk at least at community level 2.60f/sed for safe ambulation    Time  6    Period  Weeks    Status  New    Target Date  10/11/18            Plan - 09/27/18 1258    Clinical Impression Statement Came late to clinic.  Pt comes to clinicw ith 1/10 pain but complained over the weekend had to take pain meds for excruciating pain in right thoracic region.  Pt is able to do exercises and realizes she is weak and needs to continue exercise for strength.  Pt reports 50% better than on eval and is independent with initial HEP    Rehab Potential  Good    PT  Frequency  2x / week    PT Duration  6 weeks    PT Treatment/Interventions  ADLs/Self Care Home Management;Cryotherapy;Electrical Stimulation;Iontophoresis 459mml Dexamethasone;Moist Heat;Ultrasound;Therapeutic exercise;Therapeutic activities;Functional mobility training;Gait training;Neuromuscular re-education;Patient/family education;Manual techniques;Passive range of motion;Dry needling;Taping;Joint Manipulations    PT Next Visit Plan  basic back and thoracic extension. talk about pilates and community wellness    PT Home Exercise Plan  thoracic extensions,  decompression series sit to stand.  stretch out strap stretch of thoracic kyphosis    Consulted and Agree with Plan of Care  Patient       Patient will benefit from skilled therapeutic intervention in order to improve the following deficits and impairments:  Difficulty walking, Decreased activity tolerance, Decreased mobility, Decreased range of motion, Decreased strength, Hypomobility, Increased muscle spasms, Pain, Postural dysfunction, Improper body mechanics  Visit Diagnosis: Pain in thoracic spine  Abnormal posture  Muscle weakness (generalized)  Other abnormalities of gait and mobility     Problem List Patient Active Problem List   Diagnosis Date Noted  . 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 [facility] (cere[facility] vascular accident) ([facility]    LaVoncille LoPT Certified Exercise Expert for the Aging Adult  09/27/18 1:04 PM Phone: [phone]: 33[facility]hone: [phone] Fax:  [phone]: BoKAMELIA LAMPKINSRN: [id number] of Birth: 07/1941-11-15

## 2018-09-29 ENCOUNTER — Ambulatory Visit: Payer: Medicare Other | Admitting: Physical Therapy

## 2018-09-29 ENCOUNTER — Encounter: Payer: Self-pay | Admitting: Physical Therapy

## 2018-09-29 DIAGNOSIS — R2689 Other abnormalities of gait and mobility: Secondary | ICD-10-CM | POA: Diagnosis not present

## 2018-09-29 DIAGNOSIS — M6281 Muscle weakness (generalized): Secondary | ICD-10-CM

## 2018-09-29 DIAGNOSIS — M546 Pain in thoracic spine: Secondary | ICD-10-CM | POA: Diagnosis not present

## 2018-09-29 DIAGNOSIS — R293 Abnormal posture: Secondary | ICD-10-CM

## 2018-09-29 NOTE — Therapy (Signed)
[place], [place], [address] Phone: [phone]   Fax:  [phone]  Physical Therapy Treatment  Patient Details  Name: [name] MRN: [id number] Date of Birth: [birth year] Referring Provider (PT): Josetta Huddle MD   Encounter Date: 09/29/2018  PT End of Session - 09/29/18 1025    Visit Number  8    Number of Visits  12    Date for PT Re-Evaluation  10/11/18    Authorization Type  medicare KX modifier at 15 and progressnote at 10th visit    PT Start Time  1016    PT Stop Time  1104    PT Time Calculation (min)  48 min    Activity Tolerance  Patient tolerated treatment well    Behavior During Therapy  [facility] for tasks assessed/performed       Past Medical History:  Diagnosis Date  . Arthritis   . CVA (cerebral vascular accident) ([place])   . Depression   . GERD (gastroesophageal reflux disease)   . Hypertension     Past Surgical History:  Procedure Laterality Date  . CHOLECYSTECTOMY N/A 08/28/2015   Procedure: LAPAROSCOPIC CHOLECYSTECTOMY;  Surgeon: Fanny Skates, MD;  Location: [place];  Service: General;  Laterality: N/A;  . HIATAL HERNIA REPAIR    . WISDOM TOOTH EXTRACTION      There were no vitals filed for this visit.  Subjective Assessment - 09/29/18 1025    Subjective  Pt is 0/10 pain and declines TPDN      Pertinent History  CVA 2012,  GERD, depression, See medical history    Limitations  Sitting;Standing;House hold activities    Currently in Pain?  No/denies    Pain Score  0-No pain    Pain Location  Thoracic    Pain Orientation  Right    Pain Descriptors / Indicators  Aching    Pain Type  Chronic pain    Pain Onset  More than a month ago         [facility] PT Assessment - 09/29/18 0001      Sit to Stand   Comments  20.70 sec      ROM / Strength   AROM / PROM / Strength  AROM;Strength      AROM   Right Shoulder Flexion  150 Degrees    Right Shoulder ABduction  135  Degrees    Left Shoulder Flexion  155 Degrees    Left Shoulder ABduction  152 Degrees    Lumbar Flexion  60    Lumbar Extension  15    Lumbar - Right Side Bend  20    Lumbar - Left Side Bend  20    Lumbar - Right Rotation  75 % available    Lumbar - Left Rotation  75% available    Thoracic Flexion  21    Thoracic Extension  +5      Strength   Right Shoulder Flexion  4-/5    Right Shoulder ABduction  4-/5    Left Shoulder Flexion  4/5    Left Shoulder ABduction  4/5    Right Hip Flexion  4/5    Right Hip Extension  4-/5    Right Hip ABduction  4-/5    Left Hip Flexion  4/5    Left Hip Extension  4-/5    Left Hip ABduction  4-/5    Right Knee Flexion  4/5    Right Knee Extension  4/5  Left Knee Flexion  4/5    Left Knee Extension  4/5                   [facility] Adult PT Treatment/Exercise - 09/29/18 0001      Self-Care   Self-Care  Other Self-Care Comments    Other Self-Care Comments   walking program and communty wellness education as well as strengthening education for older adults      Lumbar Exercises: Standing   Other Standing Lumbar Exercises  STS 15 x 2   also sink squat with glut activation x 10   x 10 with 10# weight    Other Standing Lumbar Exercises  hip hinge 1/2 AROM with counter UE support x 7 each leg      Shoulder Exercises: Seated   Other Seated Exercises  biceps 5 # 20 times each    feet placed on 2 inch stool for good foot wt bearing     Shoulder Exercises: Prone   Other Prone Exercises  prone shoulder ext, horizontal abd and W's bil 3 x 10  each    no pain     Shoulder Exercises: Standing   External Rotation  Both;15 reps;Theraband    Theraband Level (Shoulder External Rotation)  Level 3 (Green)    External Rotation Limitations  x 2    Extension  Strengthening;Both;15 reps;Theraband    Theraband Level (Shoulder Extension)  Level 2 (Red)    Extension Limitations  x 2    Row  Strengthening;Both;15 reps;Theraband    Theraband Level  (Shoulder Row)  Level 2 (Red)    Row Limitations  x 2    Diagonals Limitations  deadlift from mat with 10 lb 10 x     Other Standing Exercises  Pt doing row,abd and then 90 ER with red t band x 15 and then ad 1/4 turn to the right for shoulder stability.  standing ER with 90 abduction 15 x 2  right and left each    Other Standing Exercises  pallof press 15 x 2 red t band.  , then arms extending and left and right rotation 15 x 2 red t band             PT Education - 09/29/18 1059    Education Details  added sit to stand education and community wellness    Person(s) Educated  Patient    Methods  Explanation;Demonstration    Comprehension  Verbalized understanding;Returned demonstration       PT Short Term Goals - 09/27/18 1027      PT SHORT TERM GOAL #1   Title  Pt will be independent with initial HEP    Baseline  Indpenedent    Time  2    Period  Weeks    Status  Achieved      PT SHORT TERM GOAL #2   Title  She will report incidents of pain decreased 50% or more in intensity or frequency while walking and standing to do chores    Baseline  1/10 today and is 50% better than on eval and can stand 30 minutes    Time  2    Period  Weeks    Status  Achieved        PT Long Term Goals - 09/29/18 1109      PT LONG TERM GOAL #1   Title  Pt will be independent in advanced HEP    Baseline  Pt working on advanced shoulder/thoracic  and lumbar and lifting strength    Time  6    Period  Weeks    Status  On-going      PT LONG TERM GOAL #2   Title  She will report no incidents of pain for at least 10 days    Baseline  Pt with no pain today for the first time 09-29-18    Time  6    Period  Weeks    Status  On-going      PT LONG TERM GOAL #3   Title  She will report generally minimal tenderness in paraspinals /medial scapula on RT     Baseline  Pt with slight pain on palpation 3/10    Time  6    Period  Weeks    Status  On-going      PT LONG TERM GOAL #4   Title  Pt will  be able to walk for 30 to 45 minutes with minimal right thoracic/scapular pain    Baseline  Pt beginning walking program    Time  6    Period  Weeks    Status  On-going      PT LONG TERM GOAL #5   Title  Pt will be able to perform 5 x sts in 14 sec or under to show increased  LE strength and control for improved balance for walking    Baseline  Pt with 18. 26  sec in 5 x sts and uses momentum to come to standing on eval/ today was 20.70 sec   needs work on sit to stand    Time  6    Period  Weeks    Status  On-going      PT LONG TERM GOAL #6   Title  "FOTO will improve from 53%limitation   to 37% limitation    indicating improved functional mobility .     Time  6    Period  Weeks    Status  Unable to assess      PT LONG TERM GOAL #7   Title  Pt will be able to walk at least at community level 2.2ft/sed for safe ambulation    Time  6    Period  Weeks    Status  Unable to assess            Plan - 09/29/18 1111    Clinical Impression Statement  Pt with all STG achieved and no pain in right thoracic for the first time.  Pt 5 x STS 20.70 sec and Ms Tuckerman realizes she needs to continue strengthening and walking program in order to improve and be mobilie and walk with confidence and safety. Pt is at high risk for fall with thoracic flexion/kyphosis and decreased gait veloity and 5 x STS higher than 14 sec.  Will continue to progress toward completion of LTG.    Rehab Potential  Good    PT Frequency  2x / week    PT Duration  6 weeks    PT Treatment/Interventions  ADLs/Self Care Home Management;Cryotherapy;Electrical Stimulation;Iontophoresis 4mg /ml Dexamethasone;Moist Heat;Ultrasound;Therapeutic exercise;Therapeutic activities;Functional mobility training;Gait training;Neuromuscular re-education;Patient/family education;Manual techniques;Passive range of motion;Dry needling;Taping;Joint Manipulations    PT Next Visit Plan  basic back and thoracic extension. gait velocity and  strength     PT Home Exercise Plan  thoracic extensions,  decompression series sit to stand.  stretch out strap stretch of thoracic kyphosis sit to stand    Consulted and Agree with Plan of  Care  Patient       Patient will benefit from skilled therapeutic intervention in order to improve the following deficits and impairments:  Difficulty walking, Decreased activity tolerance, Decreased mobility, Decreased range of motion, Decreased strength, Hypomobility, Increased muscle spasms, Pain, Postural dysfunction, Improper body mechanics  Visit Diagnosis: Pain in thoracic spine  Abnormal posture  Muscle weakness (generalized)  Other abnormalities of gait and mobility     Problem List Patient Active Problem List   Diagnosis Date Noted  . 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 CVA (cerebral vascular accident) ([place])    [name], PT Certified Exercise Expert for the Aging Adult  09/29/18 11:18 AM Phone: [phone] Fax: [facility] [address] [place], [place], [address] Phone: [phone]   Fax:  [phone]  Name: LUCYLE ALUMBAUGH MRN: [id number] Date of Birth: November 09, 1941

## 2018-09-29 NOTE — Patient Instructions (Signed)
Sit-to-Stand Exercise The sit-to-stand exercise (also known as the chair stand or chair rise exercise) strengthens your lower body and helps you maintain or improve your mobility and independence. The goal is to do the sit-to-stand exercise without using your hands. This will be easier as you become stronger. You should always talk with your health care provider before starting any exercise program, especially if you have had recent surgery. Do the exercise exactly as told by your health care provider and adjust it as directed. It is normal to feel mild stretching, pulling, tightness, or discomfort as you do this exercise, but you should stop right away if you feel sudden pain or your pain gets worse. Do not begin doing this exercise until told by your health care provider. What the sit-to-stand exercise does The sit-to-stand exercise helps to strengthen the muscles in your thighs and the muscles in the center of your body that give you stability (core muscles). This exercise is especially helpful if:  You have had knee or hip surgery.  You have trouble getting up from a chair, out of a car, or off the toilet.  How to do the sit-to-stand exercise 1. Sit toward the front edge of a sturdy chair without armrests. Your knees should be bent and your feet should be flat on the floor and shoulder-width apart. 2. Place your hands lightly on each side of the seat. Keep your back and neck as straight as possible, with your chest slightly forward. 3. Breathe in slowly. Lean forward and slightly shift your weight to the front of your feet. 4. Breathe out as you slowly stand up. Use your hands as little as possible. 5. Stand and pause for a full breath in and out. 6. Breathe in as you sit down slowly. Tighten your core and abdominal muscles to control your lowering as much as possible. 7. Breathe out slowly. 8. Do this exercise 10-15 times. If needed, do it fewer times until you build up strength. 9. Rest for  1 minute, then do another set of 10-15 repetitions. To change the difficulty of the sit-to-stand exercise  If the exercise is too difficult, use a chair with sturdy armrests, and push off the armrests to help you come to the standing position. You can also use the armrests to help slowly lower yourself back to sitting. As this gets easier, try to use your arms less. You can also place a firm cushion or pillow on the chair to make the surface higher.  If this exercise is too easy, do not use your arms to help raise or lower yourself. You can also wear a weighted vest, use hand weights, increase your repetitions, or try a lower chair. General tips  You may feel tired when starting an exercise routine. This is normal.  You may have muscle soreness that lasts a few days. This is normal. As you get stronger, you may not feel muscle soreness.  Use smooth, steady movements.  Do not  hold your breath during strength exercises. This can cause unsafe changes in your blood pressure.  Breathe in slowly through your nose, and breathe out slowly through your mouth. Summary  Strengthening your lower body is an important step to help you move safely and independently.  The sit-to-stand exercise helps strengthen the muscles in your thighs and core.  You should always talk with your health care provider before starting any exercise program, especially if you have had recent surgery. This information is not intended to replace   advice given to you by your health care provider. Make sure you discuss any questions you have with your health care provider. Document Released: 12/24/2016 Document Revised: 12/24/2016 Document Reviewed: 12/24/2016 Elsevier Interactive Patient Education  2018 [place] have a 5 time sit to stand time of 20.70 sec  And need to do 13 sec to decrease your risk of falls.  [name], PT Certified Exercise Expert for the Aging Adult  09/29/18 10:47 AM Phone:  [phone] Fax: [phone]

## 2018-10-04 ENCOUNTER — Encounter: Payer: Self-pay | Admitting: Physical Therapy

## 2018-10-04 ENCOUNTER — Ambulatory Visit: Payer: Medicare Other | Admitting: Physical Therapy

## 2018-10-04 DIAGNOSIS — M6281 Muscle weakness (generalized): Secondary | ICD-10-CM

## 2018-10-04 DIAGNOSIS — R293 Abnormal posture: Secondary | ICD-10-CM

## 2018-10-04 DIAGNOSIS — M546 Pain in thoracic spine: Secondary | ICD-10-CM | POA: Diagnosis not present

## 2018-10-04 DIAGNOSIS — R2689 Other abnormalities of gait and mobility: Secondary | ICD-10-CM

## 2018-10-04 NOTE — Therapy (Signed)
[place], [place], [address] Phone: [phone]   Fax:  [phone]  Physical Therapy Treatment  Patient Details  Name: [name] MRN: [id number] Date of Birth: 03-Dec-1940 Referring Provider (PT): Josetta Huddle MD   Encounter Date: 10/04/2018  PT End of Session - 10/04/18 1107    Visit Number  9    Number of Visits  12    Date for PT Re-Evaluation  10/11/18    Authorization Type  medicare KX modifier at 15 and progressnote at 10th visit    PT Start Time  1100    PT Stop Time  1144    PT Time Calculation (min)  44 min    Activity Tolerance  Patient tolerated treatment well    Behavior During Therapy  [facility] for tasks assessed/performed       Past Medical History:  Diagnosis Date  . Arthritis   . CVA (cerebral vascular accident) ([place])   . Depression   . GERD (gastroesophageal reflux disease)   . Hypertension     Past Surgical History:  Procedure Laterality Date  . CHOLECYSTECTOMY N/A 08/28/2015   Procedure: LAPAROSCOPIC CHOLECYSTECTOMY;  Surgeon: Fanny Skates, MD;  Location: [place];  Service: General;  Laterality: N/A;  . HIATAL HERNIA REPAIR    . WISDOM TOOTH EXTRACTION      There were no vitals filed for this visit.  Subjective Assessment - 10/04/18 1112    Subjective  Pt is 0/10 in thorax today.  Pt was sore from core exercise.     Pertinent History  CVA 2012,  GERD, depression, See medical history    Limitations  Sitting;Standing;House hold activities    Currently in Pain?  No/denies    Pain Score  0-No pain    Pain Location  Thoracic    Pain Orientation  Right    Pain Descriptors / Indicators  Aching    Pain Type  Chronic pain    Pain Onset  More than a month ago    Pain Score  0    Pain Location  Shoulder    Pain Orientation  Right    Pain Descriptors / Indicators  Aching;Sharp    Pain Type  Acute pain    Pain Onset  1 to 4 weeks ago                        [facility] Adult PT Treatment/Exercise - 10/04/18 0001      Self-Care   Self-Care  Other Self-Care Comments    Other Self-Care Comments   reinforce walking program and community wellness and resources      Neck Exercises: Standing   Other Standing Exercises  standing walk push up x 15       Lumbar Exercises: Aerobic   Tread Mill  4 minutes at 1.5 mph and 2.0 for 2 minutes total 6 and  159 pulse       Lumbar Exercises: Seated   Other Seated Lumbar Exercises  diaphragmatic breathing and ab sets with UE ext on table for ab set activation 10 x 2      Lumbar Exercises: Supine   Ab Set  10 reps    AB Set Limitations  10 sec    Pelvic Tilt  10 reps    Pelvic Tilt Limitations  diaphragmatic breathing 10 deep breaths and throughout exercises session    Clam  10 reps    Clam Limitations  x 2    Heel Slides  10 reps    Heel Slides Limitations  x 2 with ab set     Bent Knee Raise  10 reps;3 seconds    Bent Knee Raise Limitations  x 2 and then with table top and toe taps     Bridge  10 reps    Bridge Limitations  x 2      Lumbar Exercises: Sidelying   Other Sidelying Lumbar Exercises  clams without t band 10 x 2 right and left             PT Education - 10/04/18 1133    Education Details  added core exercises HEP and education on walking program and community resources    Person(s) Educated  Patient    Methods  Explanation;Demonstration;Tactile cues;Verbal cues;Handout    Comprehension  Returned demonstration;Verbalized understanding       PT Short Term Goals - 09/27/18 1027      PT SHORT TERM GOAL #1   Title  Pt will be independent with initial HEP    Baseline  Indpenedent    Time  2    Period  Weeks    Status  Achieved      PT SHORT TERM GOAL #2   Title  She will report incidents of pain decreased 50% or more in intensity or frequency while walking and standing to do chores    Baseline  1/10 today and is 50% better than on eval and can  stand 30 minutes    Time  2    Period  Weeks    Status  Achieved        PT Long Term Goals - 09/29/18 1109      PT LONG TERM GOAL #1   Title  Pt will be independent in advanced HEP    Baseline  Pt working on advanced shoulder/thoracic and lumbar and lifting strength    Time  6    Period  Weeks    Status  On-going      PT LONG TERM GOAL #2   Title  She will report no incidents of pain for at least 10 days    Baseline  Pt with no pain today for the first time 09-29-18    Time  6    Period  Weeks    Status  On-going      PT LONG TERM GOAL #3   Title  She will report generally minimal tenderness in paraspinals /medial scapula on RT     Baseline  Pt with slight pain on palpation 3/10    Time  6    Period  Weeks    Status  On-going      PT LONG TERM GOAL #4   Title  Pt will be able to walk for 30 to 45 minutes with minimal right thoracic/scapular pain    Baseline  Pt beginning walking program    Time  6    Period  Weeks    Status  On-going      PT LONG TERM GOAL #5   Title  Pt will be able to perform 5 x sts in 14 sec or under to show increased  LE strength and control for improved balance for walking    Baseline  Pt with 18. 26  sec in 5 x sts and uses momentum to come to standing on eval/ today was 20.70 sec   needs work on sit to stand  Time  6    Period  Weeks    Status  On-going      PT LONG TERM GOAL #6   Title  "FOTO will improve from 53%limitation   to 37% limitation    indicating improved functional mobility .     Time  6    Period  Weeks    Status  Unable to assess      PT LONG TERM GOAL #7   Title  Pt will be able to walk at least at community level 2.84ft/sed for safe ambulation    Time  6    Period  Weeks    Status  Unable to assess            Plan - 10/04/18 1145    Clinical Impression Statement  Pt reports no pain in thoracic area today but pt does have weakness and adaptive shortening in core that contributes to kyphosis.  Pt with weak grip  strength. right 9 domininat ( 20lb ) and left 24 lb. indicative of gross LE weakness.  Pt will conitnue for next 2 visits to reinforce HEP and transition her to community wellness and daily exercise    Rehab Potential  Good    PT Frequency  2x / week    PT Duration  6 weeks    PT Treatment/Interventions  ADLs/Self Care Home Management;Cryotherapy;Electrical Stimulation;Iontophoresis 4mg /ml Dexamethasone;Moist Heat;Ultrasound;Therapeutic exercise;Therapeutic activities;Functional mobility training;Gait training;Neuromuscular re-education;Patient/family education;Manual techniques;Passive range of motion;Dry needling;Taping;Joint Manipulations    PT Next Visit Plan  basic hamstring stretch with movement.  advance Q ped and gait velocity DO FOTO for getting ready for DC in  next 2 visit    PT Home Exercise Plan  thoracic extensions,  decompression series sit to stand.  stretch out strap stretch of thoracic kyphosis sit to stand, basic core strenght of adaptive shortening. of muscles    Consulted and Agree with Plan of Care  Patient       Patient will benefit from skilled therapeutic intervention in order to improve the following deficits and impairments:  Difficulty walking, Decreased activity tolerance, Decreased mobility, Decreased range of motion, Decreased strength, Hypomobility, Increased muscle spasms, Pain, Postural dysfunction, Improper body mechanics  Visit Diagnosis: Pain in thoracic spine  Abnormal posture  Muscle weakness (generalized)  Other abnormalities of gait and mobility     Problem List Patient Active Problem List   Diagnosis Date Noted  . 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 CVA (cerebral vascular accident) ([place])    [name], PT Certified Exercise Expert for the Aging Adult  10/04/18 12:54 PM Phone: [phone] Fax: [facility] [address] [place], [place], [address] Phone: [phone]   Fax:  [phone]  Name: ADAIAH JASKOT MRN: [id number] Date of Birth: Jun 04, 1941

## 2018-10-04 NOTE — Patient Instructions (Signed)
WALKING  Walking is a great form of exercise to increase your strength, endurance and overall fitness.  A walking program can help you start slowly and gradually build endurance as you go.  Everyone's ability is different, so each person's starting point will be different.  You do not have to follow them exactly.  The are just samples. You should simply find out what's right for you and stick to that program.   In the beginning, you'll start off walking 2-3 times a day for short distances.  As you get stronger, you'll be walking further at just 1-2 times per day.  A. You Can Walk For A Certain Length Of Time Each Day    Walk 5 minutes 3 times per day.  Increase 2 minutes every 2 days (3 times per day).  Work up to 25-30 minutes (1-2 times per day).   Example:   Day 1-2 5 minutes 3 times per day   Day 7-8 12 minutes 2-3 times per day   Day 13-14 25 minutes 1-2 times per day  B. You Can Walk For a Certain Distance Each Day     Distance can be substituted for time.    Example:   3 trips to mailbox (at road)   3 trips to corner of block   3 trips around the block  C. Go to local [employer] and use the track.    Walk for distance ____ around track  Or time _30-45___ minutes for a session.  Total of 150 to 180 minutes a week.  D. Walk _x___ Jog ____ Run ___  Please only do the exercises that your therapist has initialed and dated  [place]  [email or web address]               [name], PT Certified Exercise Expert for the Aging Adult  10/04/18 11:33 AM Phone: [phone] Fax: [phone]

## 2018-10-06 ENCOUNTER — Ambulatory Visit: Payer: Medicare Other | Admitting: Physical Therapy

## 2018-10-06 ENCOUNTER — Encounter: Payer: Self-pay | Admitting: Physical Therapy

## 2018-10-06 DIAGNOSIS — R293 Abnormal posture: Secondary | ICD-10-CM | POA: Diagnosis not present

## 2018-10-06 DIAGNOSIS — M6281 Muscle weakness (generalized): Secondary | ICD-10-CM

## 2018-10-06 DIAGNOSIS — M546 Pain in thoracic spine: Secondary | ICD-10-CM | POA: Diagnosis not present

## 2018-10-06 DIAGNOSIS — R2689 Other abnormalities of gait and mobility: Secondary | ICD-10-CM | POA: Diagnosis not present

## 2018-10-06 NOTE — Therapy (Addendum)
[place], [place], [address] Phone: [phone]   Fax:  [phone]  Physical Therapy Treatment/Progress Note/Discharge Note  Patient Details  Name: [name] MRN: [id number] Date of Birth: 1940-12-29 Referring Provider (PT): Josetta Huddle MD   Encounter Date: 10/06/2018  PT End of Session - 10/06/18 1152    Visit Number  10    Number of Visits  12    Date for PT Re-Evaluation  10/11/18    Authorization Type  medicare KX modifier at 15 and progressnote at 10th visit    PT Start Time  1105    PT Stop Time  1152    PT Time Calculation (min)  47 min    Activity Tolerance  Patient tolerated treatment well    Behavior During Therapy  [facility] for tasks assessed/performed       Past Medical History:  Diagnosis Date  . Arthritis   . CVA (cerebral vascular accident) ([place])   . Depression   . GERD (gastroesophageal reflux disease)   . Hypertension     Past Surgical History:  Procedure Laterality Date  . CHOLECYSTECTOMY N/A 08/28/2015   Procedure: LAPAROSCOPIC CHOLECYSTECTOMY;  Surgeon: Fanny Skates, MD;  Location: [place];  Service: General;  Laterality: N/A;  . HIATAL HERNIA REPAIR    . WISDOM TOOTH EXTRACTION      There were no vitals filed for this visit.  Subjective Assessment - 10/06/18 1113    Subjective  [name] states she does not have any pain.  I can notice that I have no problems getting out of my chair now . When I came I had trouble and I had to use my arms    Pertinent History  CVA 2012,  GERD, depression, See medical history    Limitations  Sitting;Standing;House hold activities    How long can you sit comfortably?  unlimited    How long can you stand comfortably?  limited to 30 minutes due to endurance , not pain    How long can you walk comfortably?  30 to 45 minutes limited due to  endurance , not pain    Diagnostic tests  no xrays    Patient Stated Goals  Get rid of  pain    Currently in Pain?  No/denies    Pain Score  0-No pain    Pain Location  Thoracic    Pain Orientation  Right    Pain Score  0    Pain Location  Shoulder           FOTO limtiation 28%  Improved from eval.             [facility] Adult PT Treatment/Exercise - 10/06/18 1119      Therapeutic Activites    Therapeutic Activities  Other Therapeutic Activities    Other Therapeutic Activities  floor to mat transfer and mat to standing x fer with VC and TC and techniques for utilizing furnture   needs CGAx 1 and VC and TC     Lumbar Exercises: Aerobic   Tread Mill  3 minutes at 2.0 mpg and needed to start      Lumbar Exercises: Machines for Strengthening   Leg Press  60 lb bil 5 x,  40 lb      Lumbar Exercises: Standing   Heel Raises  10 reps    Heel Raises Limitations  bil x 2 holding onto counter    Shoulder Adduction Limitations  farmers  carry with 10 # each arm ( total 20#) for 40 feet x 2    Other Standing Lumbar Exercises  STS 15 x 2   also sink squat with glut activation x 10   x 10 with 10# weight    Other Standing Lumbar Exercises  squat dead lift with 10 lb wt with VC 10 x 2      Lumbar Exercises: Seated   Other Seated Lumbar Exercises  diaphragmatic breathing and ab sets with UE ext on table for ab set activation 10 x 2      Shoulder Exercises: Seated   Other Seated Exercises  biceps 5 # 10 x 2 times each    feet placed on 2 inch stool for good foot wt bearing     Pt needing rests between exercises but able to perform with greater weights       PT Education - 10/06/18 1143    Education Details  added step ups for body weight strengthening to HEP    Person(s) Educated  Patient    Methods  Explanation;Demonstration;Tactile cues;Verbal cues;Handout    Comprehension  Verbalized understanding;Returned demonstration       PT Short Term Goals - 09/27/18 1027      PT SHORT TERM GOAL #1   Title  Pt will be independent with initial HEP    Baseline   Indpenedent    Time  2    Period  Weeks    Status  Achieved      PT SHORT TERM GOAL #2   Title  She will report incidents of pain decreased 50% or more in intensity or frequency while walking and standing to do chores    Baseline  1/10 today and is 50% better than on eval and can stand 30 minutes    Time  2    Period  Weeks    Status  Achieved        PT Long Term Goals - 10/06/18 1152      PT LONG TERM GOAL #1   Title  Pt will be independent in advanced HEP    Baseline  reinforcing advanced shoulder/thoracic and lumbar and lifting strength    Time  6    Period  Weeks    Status  On-going      PT LONG TERM GOAL #2   Title  She will report no incidents of pain for at least 10 days    Baseline  Pt with no pain and able to rise from the chair with ease now    Time  6    Period  Weeks    Status  Achieved      PT LONG TERM GOAL #3   Title  She will report generally minimal tenderness in paraspinals /medial scapula on RT     Baseline  Pt reports no pain    Time  6    Period  Weeks    Status  Achieved      PT LONG TERM GOAL #4   Title  Pt will be able to walk for 30 to 45 minutes with minimal right thoracic/scapular pain    Baseline  Pt now walking 30 to 45 minutes of pain beginning as slow pace  2.0 mph on treadmill for 3 minutes    Time  6    Period  Weeks    Status  Achieved      PT LONG TERM GOAL #5   Title  Pt will  be able to perform 5 x sts in 14 sec or under to show increased  LE strength and control for improved balance for walking    Time  6    Period  Weeks    Status  Unable to assess      PT LONG TERM GOAL #6   Title  "FOTO will improve from 53%limitation   to 37% limitation    indicating improved functional mobility .     Time  6    Period  Weeks    Status Achieved  28% limitation over phone FOTO 10-11-18      PT LONG TERM GOAL #7   Title  Pt will be able to walk at least at community level 2.8f/sed for safe ambulation    Time  6    Period  Weeks     Status  Unable to assess            Plan - 10/06/18 1155    Clinical Impression Statement  Pt reports no pain in thorax and low back today and emphasizes that she is able to rise from chair with ease now. LTGs # 2 , 3 and 4 accomplished.  Her delayed onset muscle soreness from exercise  better today.  Pt will be ready for DC next visit . and will assess and PT is confident [name] will be able to accomplish all her goals. Pt today demonstrated ability to rise from mat to chair to standing with CGAx 1 assist and VC    Rehab Potential  Good    PT Frequency  2x / week    PT Duration  6 weeks    PT Treatment/Interventions  ADLs/Self Care Home Management;Cryotherapy;Electrical Stimulation;Iontophoresis 473mml Dexamethasone;Moist Heat;Ultrasound;Therapeutic exercise;Therapeutic activities;Functional mobility training;Gait training;Neuromuscular re-education;Patient/family education;Manual techniques;Passive range of motion;Dry needling;Taping;Joint Manipulations    PT Next Visit Plan   GOALS basic hamstring stretch with movement.  advance Q ped and gait velocity DO FOTO for getting ready for DC in  next visit    PT Home Exercise Plan  thoracic extensions,  decompression series sit to stand.  stretch out strap stretch of thoracic kyphosis sit to stand, basic core strenght of adaptive shortening. of muscles    Consulted and Agree with Plan of Care  Patient       Patient will benefit from skilled therapeutic intervention in order to improve the following deficits and impairments:  Difficulty walking, Decreased activity tolerance, Decreased mobility, Decreased range of motion, Decreased strength, Hypomobility, Increased muscle spasms, Pain, Postural dysfunction, Improper body mechanics  Visit Diagnosis: Pain in thoracic spine  Abnormal posture  Muscle weakness (generalized)  Other abnormalities of gait and mobility     Problem List Patient Active Problem List   Diagnosis Date Noted   . 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 CVA (cere[facility] vascular accident) ([facility]    [name] Certified Exercise Expert for the Aging Adult  10/06/18 12:09 PM Phone: [phone]: 33[facility]hone: [phone] Fax:  [phone]: BoMARIJA CALAMARIRN: [id number] of Birth: 07/1941-01-20  Progress Note Reporting Period 08-30-18 to 10-06-18  See note above  for Objective Data and Assessment of Progress/Goals.  Pt has accomplished #2,3 and 4 LTG's this visit and educated on floor to mat transfer with demonstration and VC and TC with CGAx 1  Pt is able to rise  from chair without UE support.  Has one remaining visit to assess rest of goals and DC to community wellness or [employer] program  [name], PT Certified Exercise Expert for the Aging Adult  10/06/18 12:09 PM Phone: [phone] Fax: [phone]  PHYSICAL THERAPY DISCHARGE SUMMARY  Visits from Start of Care: 10  Current functional level related to goals / functional outcomes: As above  Talked on phone after pt did not attend appt on 10-11-18  Did Foto on phone.  Pt with 27% limitation much improved from eval.   Remaining deficits: Over all weakness due to disuse but pt will be attending [employer] and walking for health.   Education / Equipment: HEP Plan: Patient agrees to discharge.  Patient goals were partially met. Patient is being discharged due to being pleased with the current functional level.  ?????    And has no thoracic pain with exercise and is able to transfer from floor to standing, realizes need to continue strengthening and walking  at community level.   Pt was called 10-11-18  And did FOTO over phone.  Pt has made great gains and is happy to discharge at her current functional level.  [name], PT Certified Exercise Expert for the Aging Adult  10/11/18 1:01 PM Phone: [phone] Fax: [phone]

## 2018-10-06 NOTE — Patient Instructions (Signed)
Step Down: Anterior   Copyright  VHI. All rights reserved.  Step-Up: Lateral   Step up to side with right leg. Bring other foot up onto _4-6___ inch step. Return to floor position with left leg. Repeat __10 x2__ times per session. Do __1__ sessions per day. Repeat in dimly lit room. Repeat with eyes closed.  Copyright  VHI. All rights reserved.  Forward   Facing step, place one leg on step, flexed at hip. Step up slowly, bringing hips in line with knee and shoulder. Bring other foot onto step. Reverse process to step back down. Repeat with other leg. Do _10___ repetitions, __2__ sets. Right and left side each  http://bt.exer.us/154   Copyright  VHI. All rights reserved.  [name], PT Certified Exercise Expert for the Aging Adult  10/06/18 11:41 AM Phone: [phone] Fax: [phone]

## 2018-10-11 ENCOUNTER — Ambulatory Visit: Payer: Medicare Other | Admitting: Physical Therapy

## 2018-11-01 DIAGNOSIS — E7800 Pure hypercholesterolemia, unspecified: Secondary | ICD-10-CM | POA: Diagnosis not present

## 2018-11-01 DIAGNOSIS — F419 Anxiety disorder, unspecified: Secondary | ICD-10-CM | POA: Diagnosis not present

## 2018-11-01 DIAGNOSIS — R197 Diarrhea, unspecified: Secondary | ICD-10-CM | POA: Diagnosis not present

## 2018-11-01 DIAGNOSIS — I10 Essential (primary) hypertension: Secondary | ICD-10-CM | POA: Diagnosis not present

## 2018-11-01 DIAGNOSIS — M549 Dorsalgia, unspecified: Secondary | ICD-10-CM | POA: Diagnosis not present

## 2018-11-01 DIAGNOSIS — E669 Obesity, unspecified: Secondary | ICD-10-CM | POA: Diagnosis not present

## 2018-11-01 DIAGNOSIS — Z889 Allergy status to unspecified drugs, medicaments and biological substances status: Secondary | ICD-10-CM | POA: Diagnosis not present

## 2018-11-01 DIAGNOSIS — F322 Major depressive disorder, single episode, severe without psychotic features: Secondary | ICD-10-CM | POA: Diagnosis not present

## 2018-12-30 DIAGNOSIS — H524 Presbyopia: Secondary | ICD-10-CM | POA: Diagnosis not present

## 2018-12-30 DIAGNOSIS — Z961 Presence of intraocular lens: Secondary | ICD-10-CM | POA: Diagnosis not present

## 2018-12-30 DIAGNOSIS — H353132 Nonexudative age-related macular degeneration, bilateral, intermediate dry stage: Secondary | ICD-10-CM | POA: Diagnosis not present

## 2019-04-18 DIAGNOSIS — Z1389 Encounter for screening for other disorder: Secondary | ICD-10-CM | POA: Diagnosis not present

## 2019-04-18 DIAGNOSIS — Z0000 Encounter for general adult medical examination without abnormal findings: Secondary | ICD-10-CM | POA: Diagnosis not present

## 2019-04-19 ENCOUNTER — Other Ambulatory Visit: Payer: Self-pay | Admitting: Internal Medicine

## 2019-04-19 DIAGNOSIS — Z1231 Encounter for screening mammogram for malignant neoplasm of breast: Secondary | ICD-10-CM

## 2019-06-29 ENCOUNTER — Ambulatory Visit
Admission: RE | Admit: 2019-06-29 | Discharge: 2019-06-29 | Disposition: A | Discharge status: Home / Self Care | Payer: Medicare Other | Source: Ambulatory Visit | Attending: Internal Medicine | Admitting: Internal Medicine

## 2019-06-29 ENCOUNTER — Other Ambulatory Visit: Payer: Self-pay

## 2019-06-29 DIAGNOSIS — Z1231 Encounter for screening mammogram for malignant neoplasm of breast: Secondary | ICD-10-CM

## 2019-07-04 DIAGNOSIS — H353132 Nonexudative age-related macular degeneration, bilateral, intermediate dry stage: Secondary | ICD-10-CM | POA: Diagnosis not present

## 2019-07-07 DIAGNOSIS — F322 Major depressive disorder, single episode, severe without psychotic features: Secondary | ICD-10-CM | POA: Diagnosis not present

## 2019-07-07 DIAGNOSIS — I10 Essential (primary) hypertension: Secondary | ICD-10-CM | POA: Diagnosis not present

## 2019-07-07 DIAGNOSIS — M549 Dorsalgia, unspecified: Secondary | ICD-10-CM | POA: Diagnosis not present

## 2019-07-07 DIAGNOSIS — E538 Deficiency of other specified B group vitamins: Secondary | ICD-10-CM | POA: Diagnosis not present

## 2019-07-07 DIAGNOSIS — R32 Unspecified urinary incontinence: Secondary | ICD-10-CM | POA: Diagnosis not present

## 2019-07-07 DIAGNOSIS — E7800 Pure hypercholesterolemia, unspecified: Secondary | ICD-10-CM | POA: Diagnosis not present

## 2019-07-07 DIAGNOSIS — Z0001 Encounter for general adult medical examination with abnormal findings: Secondary | ICD-10-CM | POA: Diagnosis not present

## 2019-07-07 DIAGNOSIS — F419 Anxiety disorder, unspecified: Secondary | ICD-10-CM | POA: Diagnosis not present

## 2019-07-07 DIAGNOSIS — R350 Frequency of micturition: Secondary | ICD-10-CM | POA: Diagnosis not present

## 2019-07-07 DIAGNOSIS — Z889 Allergy status to unspecified drugs, medicaments and biological substances status: Secondary | ICD-10-CM | POA: Diagnosis not present

## 2019-07-07 DIAGNOSIS — E669 Obesity, unspecified: Secondary | ICD-10-CM | POA: Diagnosis not present

## 2019-12-08 ENCOUNTER — Ambulatory Visit: Payer: Medicare Other | Attending: Internal Medicine

## 2019-12-08 DIAGNOSIS — Z23 Encounter for immunization: Secondary | ICD-10-CM | POA: Insufficient documentation

## 2019-12-08 NOTE — Progress Notes (Signed)
   Covid-19 Vaccination Clinic  Name:  [name]    MRN: [id number] DOB: 01/04/1941  12/08/2019  [name] was observed post Covid-19 immunization for 15 minutes without incidence. She was provided with Vaccine Information Sheet and instruction to access the V-Safe system.   [name] was instructed to call 911 with any severe reactions post vaccine: Marland Kitchen Difficulty breathing  . Swelling of your face and throat  . A fast heartbeat  . A bad rash all over your body  . Dizziness and weakness    Immunizations Administered    Name Date Dose VIS Date Route   Pfizer COVID-19 Vaccine 12/08/2019  2:18 PM 0.3 mL 10/27/2019 Intramuscular   Manufacturer: [place]   Lot: [id number]   [place]: [id number]

## 2019-12-29 ENCOUNTER — Ambulatory Visit: Payer: Medicare Other | Attending: Internal Medicine

## 2019-12-29 DIAGNOSIS — Z23 Encounter for immunization: Secondary | ICD-10-CM | POA: Insufficient documentation

## 2019-12-29 NOTE — Progress Notes (Signed)
   Covid-19 Vaccination Clinic  Name:  [name]    MRN: [id number] DOB: 17-Oct-1941  12/29/2019  [name] was observed post Covid-19 immunization for 15 minutes without incidence. She was provided with Vaccine Information Sheet and instruction to access the V-Safe system.   [name] was instructed to call 911 with any severe reactions post vaccine: Marland Kitchen Difficulty breathing  . Swelling of your face and throat  . A fast heartbeat  . A bad rash all over your body  . Dizziness and weakness    Immunizations Administered    Name Date Dose VIS Date Route   Pfizer COVID-19 Vaccine 12/29/2019  8:27 AM 0.3 mL 10/27/2019 Intramuscular   Manufacturer: [place]   Lot: [id number]   [place]: [id number]

## 2020-01-16 DIAGNOSIS — M25511 Pain in right shoulder: Secondary | ICD-10-CM | POA: Diagnosis not present

## 2020-01-16 DIAGNOSIS — M549 Dorsalgia, unspecified: Secondary | ICD-10-CM | POA: Diagnosis not present

## 2020-01-16 DIAGNOSIS — R350 Frequency of micturition: Secondary | ICD-10-CM | POA: Diagnosis not present

## 2020-01-16 DIAGNOSIS — F419 Anxiety disorder, unspecified: Secondary | ICD-10-CM | POA: Diagnosis not present

## 2020-01-16 DIAGNOSIS — R32 Unspecified urinary incontinence: Secondary | ICD-10-CM | POA: Diagnosis not present

## 2020-01-16 DIAGNOSIS — E669 Obesity, unspecified: Secondary | ICD-10-CM | POA: Diagnosis not present

## 2020-01-16 DIAGNOSIS — E7800 Pure hypercholesterolemia, unspecified: Secondary | ICD-10-CM | POA: Diagnosis not present

## 2020-01-16 DIAGNOSIS — E538 Deficiency of other specified B group vitamins: Secondary | ICD-10-CM | POA: Diagnosis not present

## 2020-01-16 DIAGNOSIS — I10 Essential (primary) hypertension: Secondary | ICD-10-CM | POA: Diagnosis not present

## 2020-01-16 DIAGNOSIS — F322 Major depressive disorder, single episode, severe without psychotic features: Secondary | ICD-10-CM | POA: Diagnosis not present

## 2020-01-16 DIAGNOSIS — Z889 Allergy status to unspecified drugs, medicaments and biological substances status: Secondary | ICD-10-CM | POA: Diagnosis not present

## 2020-01-17 DIAGNOSIS — H524 Presbyopia: Secondary | ICD-10-CM | POA: Diagnosis not present

## 2020-01-17 DIAGNOSIS — Z961 Presence of intraocular lens: Secondary | ICD-10-CM | POA: Diagnosis not present

## 2020-01-17 DIAGNOSIS — H353132 Nonexudative age-related macular degeneration, bilateral, intermediate dry stage: Secondary | ICD-10-CM | POA: Diagnosis not present

## 2020-03-06 DIAGNOSIS — E7800 Pure hypercholesterolemia, unspecified: Secondary | ICD-10-CM | POA: Diagnosis not present

## 2020-03-27 DIAGNOSIS — K589 Irritable bowel syndrome without diarrhea: Secondary | ICD-10-CM | POA: Diagnosis not present

## 2020-03-27 DIAGNOSIS — L900 Lichen sclerosus et atrophicus: Secondary | ICD-10-CM | POA: Diagnosis not present

## 2020-03-27 DIAGNOSIS — B372 Candidiasis of skin and nail: Secondary | ICD-10-CM | POA: Diagnosis not present

## 2020-03-27 DIAGNOSIS — N760 Acute vaginitis: Secondary | ICD-10-CM | POA: Diagnosis not present

## 2020-04-03 DIAGNOSIS — M79661 Pain in right lower leg: Secondary | ICD-10-CM | POA: Diagnosis not present

## 2020-04-03 DIAGNOSIS — M79642 Pain in left hand: Secondary | ICD-10-CM | POA: Diagnosis not present

## 2020-04-22 DIAGNOSIS — E7800 Pure hypercholesterolemia, unspecified: Secondary | ICD-10-CM | POA: Diagnosis not present

## 2020-04-22 DIAGNOSIS — F329 Major depressive disorder, single episode, unspecified: Secondary | ICD-10-CM | POA: Diagnosis not present

## 2020-04-22 DIAGNOSIS — F322 Major depressive disorder, single episode, severe without psychotic features: Secondary | ICD-10-CM | POA: Diagnosis not present

## 2020-04-22 DIAGNOSIS — E785 Hyperlipidemia, unspecified: Secondary | ICD-10-CM | POA: Diagnosis not present

## 2020-04-22 DIAGNOSIS — I10 Essential (primary) hypertension: Secondary | ICD-10-CM | POA: Diagnosis not present

## 2020-04-29 DIAGNOSIS — L900 Lichen sclerosus et atrophicus: Secondary | ICD-10-CM | POA: Diagnosis not present

## 2020-05-29 DIAGNOSIS — E785 Hyperlipidemia, unspecified: Secondary | ICD-10-CM | POA: Diagnosis not present

## 2020-05-29 DIAGNOSIS — I10 Essential (primary) hypertension: Secondary | ICD-10-CM | POA: Diagnosis not present

## 2020-05-29 DIAGNOSIS — F322 Major depressive disorder, single episode, severe without psychotic features: Secondary | ICD-10-CM | POA: Diagnosis not present

## 2020-05-29 DIAGNOSIS — F329 Major depressive disorder, single episode, unspecified: Secondary | ICD-10-CM | POA: Diagnosis not present

## 2020-05-29 DIAGNOSIS — E7800 Pure hypercholesterolemia, unspecified: Secondary | ICD-10-CM | POA: Diagnosis not present

## 2020-07-09 DIAGNOSIS — Z0001 Encounter for general adult medical examination with abnormal findings: Secondary | ICD-10-CM | POA: Diagnosis not present

## 2020-07-09 DIAGNOSIS — E538 Deficiency of other specified B group vitamins: Secondary | ICD-10-CM | POA: Diagnosis not present

## 2020-07-09 DIAGNOSIS — I10 Essential (primary) hypertension: Secondary | ICD-10-CM | POA: Diagnosis not present

## 2020-07-09 DIAGNOSIS — E785 Hyperlipidemia, unspecified: Secondary | ICD-10-CM | POA: Diagnosis not present

## 2020-07-09 DIAGNOSIS — E669 Obesity, unspecified: Secondary | ICD-10-CM | POA: Diagnosis not present

## 2020-07-09 DIAGNOSIS — F339 Major depressive disorder, recurrent, unspecified: Secondary | ICD-10-CM | POA: Diagnosis not present

## 2020-07-09 DIAGNOSIS — E559 Vitamin D deficiency, unspecified: Secondary | ICD-10-CM | POA: Diagnosis not present

## 2020-07-09 DIAGNOSIS — K219 Gastro-esophageal reflux disease without esophagitis: Secondary | ICD-10-CM | POA: Diagnosis not present

## 2020-07-09 DIAGNOSIS — R413 Other amnesia: Secondary | ICD-10-CM | POA: Diagnosis not present

## 2020-07-09 DIAGNOSIS — J309 Allergic rhinitis, unspecified: Secondary | ICD-10-CM | POA: Diagnosis not present

## 2020-07-31 DIAGNOSIS — Z961 Presence of intraocular lens: Secondary | ICD-10-CM | POA: Diagnosis not present

## 2020-07-31 DIAGNOSIS — H353132 Nonexudative age-related macular degeneration, bilateral, intermediate dry stage: Secondary | ICD-10-CM | POA: Diagnosis not present

## 2020-08-14 DIAGNOSIS — L900 Lichen sclerosus et atrophicus: Secondary | ICD-10-CM | POA: Diagnosis not present

## 2020-08-14 DIAGNOSIS — N3281 Overactive bladder: Secondary | ICD-10-CM | POA: Diagnosis not present

## 2020-08-14 DIAGNOSIS — L259 Unspecified contact dermatitis, unspecified cause: Secondary | ICD-10-CM | POA: Diagnosis not present

## 2020-08-14 DIAGNOSIS — L308 Other specified dermatitis: Secondary | ICD-10-CM | POA: Diagnosis not present

## 2020-08-21 DIAGNOSIS — F322 Major depressive disorder, single episode, severe without psychotic features: Secondary | ICD-10-CM | POA: Diagnosis not present

## 2020-08-21 DIAGNOSIS — E7800 Pure hypercholesterolemia, unspecified: Secondary | ICD-10-CM | POA: Diagnosis not present

## 2020-08-21 DIAGNOSIS — F329 Major depressive disorder, single episode, unspecified: Secondary | ICD-10-CM | POA: Diagnosis not present

## 2020-08-21 DIAGNOSIS — E785 Hyperlipidemia, unspecified: Secondary | ICD-10-CM | POA: Diagnosis not present

## 2020-08-21 DIAGNOSIS — I10 Essential (primary) hypertension: Secondary | ICD-10-CM | POA: Diagnosis not present

## 2020-08-21 DIAGNOSIS — F339 Major depressive disorder, recurrent, unspecified: Secondary | ICD-10-CM | POA: Diagnosis not present

## 2020-09-12 DIAGNOSIS — N3281 Overactive bladder: Secondary | ICD-10-CM | POA: Diagnosis not present

## 2020-09-12 DIAGNOSIS — L259 Unspecified contact dermatitis, unspecified cause: Secondary | ICD-10-CM | POA: Diagnosis not present

## 2020-09-12 DIAGNOSIS — L900 Lichen sclerosus et atrophicus: Secondary | ICD-10-CM | POA: Diagnosis not present

## 2020-10-16 DIAGNOSIS — E7800 Pure hypercholesterolemia, unspecified: Secondary | ICD-10-CM | POA: Diagnosis not present

## 2020-10-16 DIAGNOSIS — F339 Major depressive disorder, recurrent, unspecified: Secondary | ICD-10-CM | POA: Diagnosis not present

## 2020-10-16 DIAGNOSIS — F329 Major depressive disorder, single episode, unspecified: Secondary | ICD-10-CM | POA: Diagnosis not present

## 2020-10-16 DIAGNOSIS — K219 Gastro-esophageal reflux disease without esophagitis: Secondary | ICD-10-CM | POA: Diagnosis not present

## 2020-10-16 DIAGNOSIS — I10 Essential (primary) hypertension: Secondary | ICD-10-CM | POA: Diagnosis not present

## 2020-10-16 DIAGNOSIS — F322 Major depressive disorder, single episode, severe without psychotic features: Secondary | ICD-10-CM | POA: Diagnosis not present

## 2020-10-16 DIAGNOSIS — G4700 Insomnia, unspecified: Secondary | ICD-10-CM | POA: Diagnosis not present

## 2020-10-16 DIAGNOSIS — E785 Hyperlipidemia, unspecified: Secondary | ICD-10-CM | POA: Diagnosis not present

## 2020-10-30 DIAGNOSIS — N3945 Continuous leakage: Secondary | ICD-10-CM | POA: Diagnosis not present

## 2020-10-30 DIAGNOSIS — L900 Lichen sclerosus et atrophicus: Secondary | ICD-10-CM | POA: Diagnosis not present

## 2020-11-19 DIAGNOSIS — E785 Hyperlipidemia, unspecified: Secondary | ICD-10-CM | POA: Diagnosis not present

## 2020-11-19 DIAGNOSIS — E7800 Pure hypercholesterolemia, unspecified: Secondary | ICD-10-CM | POA: Diagnosis not present

## 2020-11-19 DIAGNOSIS — I10 Essential (primary) hypertension: Secondary | ICD-10-CM | POA: Diagnosis not present

## 2020-11-19 DIAGNOSIS — F329 Major depressive disorder, single episode, unspecified: Secondary | ICD-10-CM | POA: Diagnosis not present

## 2020-11-19 DIAGNOSIS — G4700 Insomnia, unspecified: Secondary | ICD-10-CM | POA: Diagnosis not present

## 2020-11-19 DIAGNOSIS — K219 Gastro-esophageal reflux disease without esophagitis: Secondary | ICD-10-CM | POA: Diagnosis not present

## 2020-11-19 DIAGNOSIS — F339 Major depressive disorder, recurrent, unspecified: Secondary | ICD-10-CM | POA: Diagnosis not present

## 2021-02-12 DIAGNOSIS — H353132 Nonexudative age-related macular degeneration, bilateral, intermediate dry stage: Secondary | ICD-10-CM | POA: Diagnosis not present

## 2021-02-12 DIAGNOSIS — H5212 Myopia, left eye: Secondary | ICD-10-CM | POA: Diagnosis not present

## 2021-02-26 DIAGNOSIS — K219 Gastro-esophageal reflux disease without esophagitis: Secondary | ICD-10-CM | POA: Diagnosis not present

## 2021-02-26 DIAGNOSIS — I10 Essential (primary) hypertension: Secondary | ICD-10-CM | POA: Diagnosis not present

## 2021-02-26 DIAGNOSIS — G4700 Insomnia, unspecified: Secondary | ICD-10-CM | POA: Diagnosis not present

## 2021-02-26 DIAGNOSIS — E785 Hyperlipidemia, unspecified: Secondary | ICD-10-CM | POA: Diagnosis not present

## 2021-02-26 DIAGNOSIS — F322 Major depressive disorder, single episode, severe without psychotic features: Secondary | ICD-10-CM | POA: Diagnosis not present

## 2021-02-26 DIAGNOSIS — E7800 Pure hypercholesterolemia, unspecified: Secondary | ICD-10-CM | POA: Diagnosis not present

## 2021-04-29 DIAGNOSIS — E7800 Pure hypercholesterolemia, unspecified: Secondary | ICD-10-CM | POA: Diagnosis not present

## 2021-04-29 DIAGNOSIS — K219 Gastro-esophageal reflux disease without esophagitis: Secondary | ICD-10-CM | POA: Diagnosis not present

## 2021-04-29 DIAGNOSIS — G4700 Insomnia, unspecified: Secondary | ICD-10-CM | POA: Diagnosis not present

## 2021-04-29 DIAGNOSIS — I10 Essential (primary) hypertension: Secondary | ICD-10-CM | POA: Diagnosis not present

## 2021-04-29 DIAGNOSIS — E785 Hyperlipidemia, unspecified: Secondary | ICD-10-CM | POA: Diagnosis not present

## 2021-04-29 DIAGNOSIS — F329 Major depressive disorder, single episode, unspecified: Secondary | ICD-10-CM | POA: Diagnosis not present

## 2021-06-10 ENCOUNTER — Other Ambulatory Visit (HOSPITAL_BASED_OUTPATIENT_CLINIC_OR_DEPARTMENT_OTHER): Payer: Self-pay

## 2021-06-10 ENCOUNTER — Emergency Department (HOSPITAL_BASED_OUTPATIENT_CLINIC_OR_DEPARTMENT_OTHER)
Admission: EM | Admit: 2021-06-10 | Discharge: 2021-06-10 | Disposition: A | Discharge status: Home / Self Care | Payer: Medicare Other | Attending: Emergency Medicine | Admitting: Emergency Medicine

## 2021-06-10 ENCOUNTER — Encounter (HOSPITAL_BASED_OUTPATIENT_CLINIC_OR_DEPARTMENT_OTHER): Payer: Self-pay | Admitting: Emergency Medicine

## 2021-06-10 ENCOUNTER — Emergency Department (HOSPITAL_BASED_OUTPATIENT_CLINIC_OR_DEPARTMENT_OTHER): Payer: Medicare Other | Admitting: Radiology

## 2021-06-10 ENCOUNTER — Other Ambulatory Visit: Payer: Self-pay

## 2021-06-10 DIAGNOSIS — M25562 Pain in left knee: Secondary | ICD-10-CM | POA: Diagnosis not present

## 2021-06-10 DIAGNOSIS — Z79899 Other long term (current) drug therapy: Secondary | ICD-10-CM | POA: Diagnosis not present

## 2021-06-10 DIAGNOSIS — K0889 Other specified disorders of teeth and supporting structures: Secondary | ICD-10-CM | POA: Diagnosis not present

## 2021-06-10 DIAGNOSIS — I10 Essential (primary) hypertension: Secondary | ICD-10-CM | POA: Insufficient documentation

## 2021-06-10 DIAGNOSIS — M25561 Pain in right knee: Secondary | ICD-10-CM | POA: Insufficient documentation

## 2021-06-10 DIAGNOSIS — M26622 Arthralgia of left temporomandibular joint: Secondary | ICD-10-CM | POA: Diagnosis not present

## 2021-06-10 DIAGNOSIS — Z7982 Long term (current) use of aspirin: Secondary | ICD-10-CM | POA: Insufficient documentation

## 2021-06-10 LAB — CBG MONITORING, ED: Glucose-Capillary: 106 mg/dL — ABNORMAL HIGH (ref 70–99)

## 2021-06-10 MED ORDER — OXYCODONE HCL 5 MG PO TABS
2.5000 mg | ORAL_TABLET | Freq: Every evening | ORAL | 0 refills | Status: DC | PRN
Start: 2021-06-10 — End: 2022-10-09
  Filled 2021-06-10: qty 4, 7d supply, fill #0

## 2021-06-10 NOTE — ED Provider Notes (Signed)
[place] EMERGENCY DEPT Provider Note   CSN: [id number] Arrival date & time: 06/10/21  1059     History Chief Complaint  Patient presents with   Knee Pain   Facial Pain    [name] is a 80 y.o. female.  The history is provided by the patient.  Knee Pain Location:  Knee Knee location:  R knee Pain details:    Quality:  Throbbing   Radiates to:  Does not radiate   Severity:  Severe   Onset quality:  Sudden   Duration:  3 days   Timing:  Intermittent   Progression:  Unchanged Chronicity:  New Prior injury to area:  No Relieved by:  Nothing Exacerbated by: worse at night when she is in bed, prevents sleep. Ineffective treatments:  Acetaminophen Associated symptoms: no back pain, no decreased ROM, no fever, no numbness, no swelling and no tingling   Dental Pain Toothache location: left TMJ. Quality:  Throbbing Severity:  Moderate Onset quality:  Gradual Duration:  4 days Timing:  Constant Progression:  Worsening Chronicity:  New Context: poor dentition   Context comment:  Has dealt with a problematic tooth in the upper left jaw for quite some time Relieved by:  Nothing Worsened by:  Jaw movement Ineffective treatments:  Rest Associated symptoms: no difficulty swallowing, no drooling, no fever, no neck swelling, no oral bleeding and no trismus       Past Medical History:  Diagnosis Date   Arthritis    CVA (cerebral vascular accident) ([place])    Depression    GERD (gastroesophageal reflux disease)    Hypertension     Patient Active Problem List   Diagnosis Date Noted   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     Family History  Problem Relation Age of Onset   Breast cancer Neg Hx     Social History   Tobacco Use   Smoking status: Never   Smokeless tobacco: Never  Substance Use Topics   Alcohol use: No   Drug use: No    Home Medications Prior to Admission medications   Medication Sig Start Date End Date Taking? Authorizing Provider  amLODipine-olmesartan (AZOR) 10-40 MG per tablet Take 0.25 tablets by mouth daily.     [provider]  aspirin 325 MG tablet Take 650 mg by mouth daily. Take on Monday, Wednesday, and Friday    [provider]  Biotin 5000 MCG CAPS Take 5,000 mcg by mouth daily.    [provider]  Cyanocobalamin (VITAMIN B-12 PO) Take 1 tablet by mouth daily.    [provider]  FLUoxetine (PROZAC) 40 MG capsule Take 40 mg by mouth daily.    [provider]  HYDROcodone-acetaminophen (NORCO) 5-325 MG tablet Take 1-2 tablets by mouth every 6 (six) hours as needed for moderate pain or severe pain. 08/28/15   Fanny Skates, MD  meclizine (ANTIVERT) 25 MG tablet Take 1 or 2 po Q 6hrs for dizziness Patient taking differently: Take 25-50 mg by mouth every 6 (six) hours as needed for dizziness. Take  1 or 2 po Q 6hrs for dizziness 04/04/13   Rolland Porter, MD  Multiple Vitamins-Minerals (ICAPS AREDS 2 PO) Take 1 tablet by mouth daily.    [provider]  omeprazole (PRILOSEC) 20 MG capsule Take 20 mg by mouth daily. Tuesday, Thursday, and Saturday    [provider]  ondansetron (ZOFRAN ODT) 4 MG disintegrating tablet Take 1 tablet (4 mg total) by mouth every 8 (eight) hours as needed for nausea (or dizziness). 04/04/13   Rolland Porter, MD  topiramate (TOPAMAX) 25 MG tablet TAKE 1 TABLET BY MOUTH DAILY FOR 1 WEEK THEN TAKE 1 TABLET TWICE DAILY 05/31/13   Garvin Fila, MD  VITAMIN D, CHOLECALCIFEROL, PO Take 1 tablet by mouth daily.    [provider]    Allergies    Ambien [zolpidem tartrate], Crestor  [rosuvastatin calcium], Lisinopril, Sulfa antibiotics, and Penicillins  Review of Systems   Review of Systems  Constitutional:  Negative for chills and fever.  HENT:  Negative for drooling, ear pain and sore throat.   Eyes:  Negative for pain and visual disturbance.  Respiratory:  Negative for cough and shortness of breath.   Cardiovascular:  Negative for chest pain and palpitations.  Gastrointestinal:  Negative for abdominal pain and vomiting.  Genitourinary:  Negative for dysuria and hematuria.  Musculoskeletal:  Negative for arthralgias and back pain.  Skin:  Negative for color change and rash.  Neurological:  Negative for seizures and syncope.  All other systems reviewed and are negative.  Physical Exam Updated Vital Signs BP (!) 168/92 (BP Location: Right Arm)   Pulse 80   Temp 98.1 F (36.7 C)   Resp 16   Ht 4' 11.75" (1.518 m)   Wt 67.1 kg   SpO2 98%   BMI 29.13 kg/m   Physical Exam Vitals and nursing note reviewed.  HENT:     Head: Normocephalic and atraumatic.     Right Ear: Tympanic membrane, ear canal and external ear normal.     Left Ear: Tympanic membrane, ear canal and external ear normal.     Mouth/Throat:     Mouth: Mucous membranes are moist.     Comments: No obvious swelling over the left TMJ.  There is moderate tenderness at the inferior aspect of the joint which is worsened by jaw opening.  There is absolutely no temporal artery tenderness. Eyes:     General: No scleral icterus. Pulmonary:     Effort: Pulmonary effort is normal. No respiratory distress.  Musculoskeletal:     Cervical back: Normal range of motion.     Comments: The left knee is normal to inspection.  There is no effusion.  Range of motion is full.  There is no tenderness to palpation about the knee.  Hip range of motion is full.  Distal pulses are normal.  Sensation is normal.  Skin:    General: Skin is warm and  dry.  Neurological:     Mental Status: She is alert and oriented to person, place, and time. Mental status is at baseline.  Psychiatric:        Mood and Affect: Mood normal.    ED Results / Procedures / Treatments   Labs (all labs ordered are listed, but only abnormal results are displayed) Labs Reviewed  CBG MONITORING, ED - Abnormal; Notable for the following components:      Result Value   Glucose-Capillary 106 (*)    All other components within normal limits  EKG None  Radiology No results found.  Procedures Procedures   Medications Ordered in ED Medications - No data to display  ED Course  I have reviewed the triage vital signs and the nursing notes.  Pertinent labs & imaging results that were available during my care of the patient were reviewed by me and considered in my medical decision making (see chart for details).    MDM Rules/Calculators/A&P                           Sherena Schwertner [facility] presents with left knee pain and left jaw pain for several days.  Vital signs are within normal limits with the exception of mild hypertension.  Physical exam is reassuring.  I think her jaw pain is likely secondary to TMJ arthralgias versus referred pain from an affected molar in her left upper jaw.  I did consider temporal arteritis.  I considered other HEENT pathology such as ear infections, pharyngitis.  She was referred to dentistry.  As far as her knee, this is likely arthritis although x-ray failed to show significant arthritis.  No evidence of an inflammatory process.  No evidence of septic joint.  It does not seem to be referred pain.  She was offered a cortisone injection which she declined.  She has had success with physical therapy in the past and may seek this type of treatment.  I did give her a prescription for oxycodone and instructed her to use 2.5 mg at nighttime only.  It seems that she is having the most trouble at night, and the pain is interfering with sleep.   She understands the side effects and appropriate use of this medication.  We did discuss follow-up and the need for possible further testing if symptoms persist. Final Clinical Impression(s) / ED Diagnoses Final diagnoses:  Acute pain of left knee  Arthralgia of left temporomandibular joint    Rx / DC Orders ED Discharge Orders          Ordered    oxyCODONE (ROXICODONE) 5 MG immediate release tablet  At bedtime PRN        06/10/21 1236             Arnaldo Natal, MD 06/10/21 1249

## 2021-06-10 NOTE — ED Triage Notes (Signed)
L knee pain since Saturday. No known injury. Also reports L jaw pain since Saturday with difficulty opening her mouth.

## 2021-07-15 DIAGNOSIS — I10 Essential (primary) hypertension: Secondary | ICD-10-CM | POA: Diagnosis not present

## 2021-07-15 DIAGNOSIS — K219 Gastro-esophageal reflux disease without esophagitis: Secondary | ICD-10-CM | POA: Diagnosis not present

## 2021-07-15 DIAGNOSIS — E559 Vitamin D deficiency, unspecified: Secondary | ICD-10-CM | POA: Diagnosis not present

## 2021-07-15 DIAGNOSIS — E538 Deficiency of other specified B group vitamins: Secondary | ICD-10-CM | POA: Diagnosis not present

## 2021-07-15 DIAGNOSIS — R413 Other amnesia: Secondary | ICD-10-CM | POA: Diagnosis not present

## 2021-07-15 DIAGNOSIS — Z0001 Encounter for general adult medical examination with abnormal findings: Secondary | ICD-10-CM | POA: Diagnosis not present

## 2021-07-15 DIAGNOSIS — E785 Hyperlipidemia, unspecified: Secondary | ICD-10-CM | POA: Diagnosis not present

## 2021-07-15 DIAGNOSIS — J309 Allergic rhinitis, unspecified: Secondary | ICD-10-CM | POA: Diagnosis not present

## 2021-07-15 DIAGNOSIS — F322 Major depressive disorder, single episode, severe without psychotic features: Secondary | ICD-10-CM | POA: Diagnosis not present

## 2021-07-15 DIAGNOSIS — E669 Obesity, unspecified: Secondary | ICD-10-CM | POA: Diagnosis not present

## 2021-07-31 DIAGNOSIS — I10 Essential (primary) hypertension: Secondary | ICD-10-CM | POA: Diagnosis not present

## 2021-07-31 DIAGNOSIS — F322 Major depressive disorder, single episode, severe without psychotic features: Secondary | ICD-10-CM | POA: Diagnosis not present

## 2021-07-31 DIAGNOSIS — R413 Other amnesia: Secondary | ICD-10-CM | POA: Diagnosis not present

## 2021-07-31 DIAGNOSIS — E785 Hyperlipidemia, unspecified: Secondary | ICD-10-CM | POA: Diagnosis not present

## 2021-07-31 DIAGNOSIS — E538 Deficiency of other specified B group vitamins: Secondary | ICD-10-CM | POA: Diagnosis not present

## 2021-07-31 DIAGNOSIS — Z23 Encounter for immunization: Secondary | ICD-10-CM | POA: Diagnosis not present

## 2021-07-31 DIAGNOSIS — E559 Vitamin D deficiency, unspecified: Secondary | ICD-10-CM | POA: Diagnosis not present

## 2021-08-12 DIAGNOSIS — H353132 Nonexudative age-related macular degeneration, bilateral, intermediate dry stage: Secondary | ICD-10-CM | POA: Diagnosis not present

## 2021-08-12 DIAGNOSIS — Z961 Presence of intraocular lens: Secondary | ICD-10-CM | POA: Diagnosis not present

## 2021-10-02 DIAGNOSIS — E785 Hyperlipidemia, unspecified: Secondary | ICD-10-CM | POA: Diagnosis not present

## 2021-10-02 DIAGNOSIS — R413 Other amnesia: Secondary | ICD-10-CM | POA: Diagnosis not present

## 2021-10-02 DIAGNOSIS — F322 Major depressive disorder, single episode, severe without psychotic features: Secondary | ICD-10-CM | POA: Diagnosis not present

## 2021-10-02 DIAGNOSIS — E559 Vitamin D deficiency, unspecified: Secondary | ICD-10-CM | POA: Diagnosis not present

## 2021-10-02 DIAGNOSIS — R32 Unspecified urinary incontinence: Secondary | ICD-10-CM | POA: Diagnosis not present

## 2021-10-02 DIAGNOSIS — E538 Deficiency of other specified B group vitamins: Secondary | ICD-10-CM | POA: Diagnosis not present

## 2021-10-02 DIAGNOSIS — I10 Essential (primary) hypertension: Secondary | ICD-10-CM | POA: Diagnosis not present

## 2022-01-06 DIAGNOSIS — R32 Unspecified urinary incontinence: Secondary | ICD-10-CM | POA: Diagnosis not present

## 2022-01-06 DIAGNOSIS — R413 Other amnesia: Secondary | ICD-10-CM | POA: Diagnosis not present

## 2022-01-06 DIAGNOSIS — E559 Vitamin D deficiency, unspecified: Secondary | ICD-10-CM | POA: Diagnosis not present

## 2022-01-06 DIAGNOSIS — I10 Essential (primary) hypertension: Secondary | ICD-10-CM | POA: Diagnosis not present

## 2022-01-06 DIAGNOSIS — E785 Hyperlipidemia, unspecified: Secondary | ICD-10-CM | POA: Diagnosis not present

## 2022-01-06 DIAGNOSIS — F322 Major depressive disorder, single episode, severe without psychotic features: Secondary | ICD-10-CM | POA: Diagnosis not present

## 2022-01-06 DIAGNOSIS — E538 Deficiency of other specified B group vitamins: Secondary | ICD-10-CM | POA: Diagnosis not present

## 2022-02-18 DIAGNOSIS — Z961 Presence of intraocular lens: Secondary | ICD-10-CM | POA: Diagnosis not present

## 2022-02-18 DIAGNOSIS — H353132 Nonexudative age-related macular degeneration, bilateral, intermediate dry stage: Secondary | ICD-10-CM | POA: Diagnosis not present

## 2022-02-18 DIAGNOSIS — H52203 Unspecified astigmatism, bilateral: Secondary | ICD-10-CM | POA: Diagnosis not present

## 2022-04-06 DIAGNOSIS — E559 Vitamin D deficiency, unspecified: Secondary | ICD-10-CM | POA: Diagnosis not present

## 2022-04-06 DIAGNOSIS — R32 Unspecified urinary incontinence: Secondary | ICD-10-CM | POA: Diagnosis not present

## 2022-04-06 DIAGNOSIS — I10 Essential (primary) hypertension: Secondary | ICD-10-CM | POA: Diagnosis not present

## 2022-04-06 DIAGNOSIS — F322 Major depressive disorder, single episode, severe without psychotic features: Secondary | ICD-10-CM | POA: Diagnosis not present

## 2022-04-06 DIAGNOSIS — W19XXXA Unspecified fall, initial encounter: Secondary | ICD-10-CM | POA: Diagnosis not present

## 2022-04-06 DIAGNOSIS — E785 Hyperlipidemia, unspecified: Secondary | ICD-10-CM | POA: Diagnosis not present

## 2022-04-06 DIAGNOSIS — R413 Other amnesia: Secondary | ICD-10-CM | POA: Diagnosis not present

## 2022-04-06 DIAGNOSIS — F4321 Adjustment disorder with depressed mood: Secondary | ICD-10-CM | POA: Diagnosis not present

## 2022-04-06 DIAGNOSIS — E538 Deficiency of other specified B group vitamins: Secondary | ICD-10-CM | POA: Diagnosis not present

## 2022-04-06 DIAGNOSIS — L309 Dermatitis, unspecified: Secondary | ICD-10-CM | POA: Diagnosis not present

## 2022-06-03 DIAGNOSIS — R296 Repeated falls: Secondary | ICD-10-CM | POA: Diagnosis not present

## 2022-06-10 DIAGNOSIS — R296 Repeated falls: Secondary | ICD-10-CM | POA: Diagnosis not present

## 2022-06-12 DIAGNOSIS — R296 Repeated falls: Secondary | ICD-10-CM | POA: Diagnosis not present

## 2022-06-15 ENCOUNTER — Ambulatory Visit: Payer: Self-pay

## 2022-06-15 NOTE — Patient Outreach (Signed)
  Care Coordination   Visit Note   06/15/2022 Name: [name] MRN: [id number] DOB: [birth year]  [name] is a 81 y.o. year old female who sees Josetta Huddle, MD for primary care. I spoke with  Reginia Forts by phone today.  Follow up plan:  Outreach scheduled for June 19, 2022.  Encounter Outcome:  Pt. Scheduled  [facility] [phone]

## 2022-06-16 DIAGNOSIS — R296 Repeated falls: Secondary | ICD-10-CM | POA: Diagnosis not present

## 2022-06-18 DIAGNOSIS — R296 Repeated falls: Secondary | ICD-10-CM | POA: Diagnosis not present

## 2022-06-19 ENCOUNTER — Ambulatory Visit: Payer: Self-pay

## 2022-06-19 NOTE — Patient Outreach (Signed)
  Care Coordination   06/19/2022 Name: JILLIANNE GAMINO MRN: [id number] DOB: [birth year]   Care Coordination Outreach Attempts:  An unsuccessful telephone outreach was attempted today to offer the patient information about available care coordination services as a benefit of their health plan.   Follow Up Plan:  Additional outreach attempts will be made to offer the patient care coordination information and services.   Encounter Outcome:  No Answer  Care Coordination Interventions Activated:  No   Care Coordination Interventions:  No, not indicated    [place] Management [phone]

## 2022-06-22 NOTE — Patient Outreach (Signed)
Error Please Disregard

## 2022-07-06 DIAGNOSIS — R296 Repeated falls: Secondary | ICD-10-CM | POA: Diagnosis not present

## 2022-07-07 ENCOUNTER — Ambulatory Visit: Payer: Self-pay

## 2022-07-08 DIAGNOSIS — R296 Repeated falls: Secondary | ICD-10-CM | POA: Diagnosis not present

## 2022-07-13 DIAGNOSIS — R296 Repeated falls: Secondary | ICD-10-CM | POA: Diagnosis not present

## 2022-07-16 DIAGNOSIS — R296 Repeated falls: Secondary | ICD-10-CM | POA: Diagnosis not present

## 2022-07-21 DIAGNOSIS — F4321 Adjustment disorder with depressed mood: Secondary | ICD-10-CM | POA: Diagnosis not present

## 2022-07-21 DIAGNOSIS — I10 Essential (primary) hypertension: Secondary | ICD-10-CM | POA: Diagnosis not present

## 2022-07-21 DIAGNOSIS — E785 Hyperlipidemia, unspecified: Secondary | ICD-10-CM | POA: Diagnosis not present

## 2022-07-21 DIAGNOSIS — E559 Vitamin D deficiency, unspecified: Secondary | ICD-10-CM | POA: Diagnosis not present

## 2022-07-21 DIAGNOSIS — R32 Unspecified urinary incontinence: Secondary | ICD-10-CM | POA: Diagnosis not present

## 2022-07-21 DIAGNOSIS — E538 Deficiency of other specified B group vitamins: Secondary | ICD-10-CM | POA: Diagnosis not present

## 2022-07-21 DIAGNOSIS — Z0000 Encounter for general adult medical examination without abnormal findings: Secondary | ICD-10-CM | POA: Diagnosis not present

## 2022-07-21 DIAGNOSIS — W19XXXA Unspecified fall, initial encounter: Secondary | ICD-10-CM | POA: Diagnosis not present

## 2022-07-21 DIAGNOSIS — Z79899 Other long term (current) drug therapy: Secondary | ICD-10-CM | POA: Diagnosis not present

## 2022-07-22 DIAGNOSIS — R296 Repeated falls: Secondary | ICD-10-CM | POA: Diagnosis not present

## 2022-07-27 DIAGNOSIS — R296 Repeated falls: Secondary | ICD-10-CM | POA: Diagnosis not present

## 2022-08-04 DIAGNOSIS — R296 Repeated falls: Secondary | ICD-10-CM | POA: Diagnosis not present

## 2022-08-15 DIAGNOSIS — F4321 Adjustment disorder with depressed mood: Secondary | ICD-10-CM | POA: Diagnosis not present

## 2022-09-15 DIAGNOSIS — F4321 Adjustment disorder with depressed mood: Secondary | ICD-10-CM | POA: Diagnosis not present

## 2022-09-29 DIAGNOSIS — H353132 Nonexudative age-related macular degeneration, bilateral, intermediate dry stage: Secondary | ICD-10-CM | POA: Diagnosis not present

## 2022-10-08 ENCOUNTER — Emergency Department (HOSPITAL_COMMUNITY): Payer: Medicare Other

## 2022-10-08 ENCOUNTER — Inpatient Hospital Stay (HOSPITAL_COMMUNITY)
Admission: EM | Admit: 2022-10-08 | Discharge: 2022-10-13 | DRG: 522 | Disposition: A | Discharge status: Skilled Nursing Facility | Payer: Medicare Other | Attending: Internal Medicine | Admitting: Internal Medicine

## 2022-10-08 DIAGNOSIS — D72829 Elevated white blood cell count, unspecified: Secondary | ICD-10-CM | POA: Diagnosis present

## 2022-10-08 DIAGNOSIS — F32A Depression, unspecified: Secondary | ICD-10-CM | POA: Diagnosis not present

## 2022-10-08 DIAGNOSIS — M1990 Unspecified osteoarthritis, unspecified site: Secondary | ICD-10-CM | POA: Diagnosis present

## 2022-10-08 DIAGNOSIS — W109XXA Fall (on) (from) unspecified stairs and steps, initial encounter: Secondary | ICD-10-CM | POA: Diagnosis present

## 2022-10-08 DIAGNOSIS — Z888 Allergy status to other drugs, medicaments and biological substances status: Secondary | ICD-10-CM

## 2022-10-08 DIAGNOSIS — Z7982 Long term (current) use of aspirin: Secondary | ICD-10-CM

## 2022-10-08 DIAGNOSIS — N1831 Chronic kidney disease, stage 3a: Secondary | ICD-10-CM | POA: Diagnosis not present

## 2022-10-08 DIAGNOSIS — D631 Anemia in chronic kidney disease: Secondary | ICD-10-CM | POA: Diagnosis not present

## 2022-10-08 DIAGNOSIS — Z79899 Other long term (current) drug therapy: Secondary | ICD-10-CM | POA: Diagnosis not present

## 2022-10-08 DIAGNOSIS — Z880 Allergy status to penicillin: Secondary | ICD-10-CM

## 2022-10-08 DIAGNOSIS — S72009A Fracture of unspecified part of neck of unspecified femur, initial encounter for closed fracture: Secondary | ICD-10-CM | POA: Diagnosis present

## 2022-10-08 DIAGNOSIS — S0990XA Unspecified injury of head, initial encounter: Secondary | ICD-10-CM | POA: Diagnosis not present

## 2022-10-08 DIAGNOSIS — S72001A Fracture of unspecified part of neck of right femur, initial encounter for closed fracture: Principal | ICD-10-CM

## 2022-10-08 DIAGNOSIS — S72041A Displaced fracture of base of neck of right femur, initial encounter for closed fracture: Secondary | ICD-10-CM

## 2022-10-08 DIAGNOSIS — T68XXXA Hypothermia, initial encounter: Secondary | ICD-10-CM | POA: Diagnosis not present

## 2022-10-08 DIAGNOSIS — R0602 Shortness of breath: Secondary | ICD-10-CM | POA: Diagnosis not present

## 2022-10-08 DIAGNOSIS — K449 Diaphragmatic hernia without obstruction or gangrene: Secondary | ICD-10-CM | POA: Diagnosis not present

## 2022-10-08 DIAGNOSIS — Z8673 Personal history of transient ischemic attack (TIA), and cerebral infarction without residual deficits: Secondary | ICD-10-CM

## 2022-10-08 DIAGNOSIS — Y92015 Private garage of single-family (private) house as the place of occurrence of the external cause: Secondary | ICD-10-CM | POA: Diagnosis not present

## 2022-10-08 DIAGNOSIS — Z882 Allergy status to sulfonamides status: Secondary | ICD-10-CM | POA: Diagnosis not present

## 2022-10-08 DIAGNOSIS — R9431 Abnormal electrocardiogram [ECG] [EKG]: Secondary | ICD-10-CM | POA: Diagnosis not present

## 2022-10-08 DIAGNOSIS — I129 Hypertensive chronic kidney disease with stage 1 through stage 4 chronic kidney disease, or unspecified chronic kidney disease: Secondary | ICD-10-CM | POA: Diagnosis present

## 2022-10-08 DIAGNOSIS — I959 Hypotension, unspecified: Secondary | ICD-10-CM | POA: Diagnosis not present

## 2022-10-08 DIAGNOSIS — M1611 Unilateral primary osteoarthritis, right hip: Secondary | ICD-10-CM | POA: Diagnosis not present

## 2022-10-08 DIAGNOSIS — S72031A Displaced midcervical fracture of right femur, initial encounter for closed fracture: Principal | ICD-10-CM | POA: Diagnosis present

## 2022-10-08 DIAGNOSIS — S72009D Fracture of unspecified part of neck of unspecified femur, subsequent encounter for closed fracture with routine healing: Secondary | ICD-10-CM | POA: Diagnosis not present

## 2022-10-08 DIAGNOSIS — E876 Hypokalemia: Secondary | ICD-10-CM | POA: Diagnosis not present

## 2022-10-08 DIAGNOSIS — I10 Essential (primary) hypertension: Secondary | ICD-10-CM | POA: Diagnosis not present

## 2022-10-08 DIAGNOSIS — Z043 Encounter for examination and observation following other accident: Secondary | ICD-10-CM | POA: Diagnosis not present

## 2022-10-08 DIAGNOSIS — W19XXXA Unspecified fall, initial encounter: Secondary | ICD-10-CM | POA: Diagnosis not present

## 2022-10-08 DIAGNOSIS — K219 Gastro-esophageal reflux disease without esophagitis: Secondary | ICD-10-CM | POA: Diagnosis present

## 2022-10-08 LAB — CBC WITH DIFFERENTIAL/PLATELET
Abs Immature Granulocytes: 0.04 10*3/uL (ref 0.00–0.07)
Basophils Absolute: 0 10*3/uL (ref 0.0–0.1)
Basophils Relative: 0 %
Eosinophils Absolute: 0 10*3/uL (ref 0.0–0.5)
Eosinophils Relative: 0 %
HCT: 39.6 % (ref 36.0–46.0)
Hemoglobin: 13.7 g/dL (ref 12.0–15.0)
Immature Granulocytes: 0 %
Lymphocytes Relative: 10 %
Lymphs Abs: 1.3 10*3/uL (ref 0.7–4.0)
MCH: 30.4 pg (ref 26.0–34.0)
MCHC: 34.6 g/dL (ref 30.0–36.0)
MCV: 88 fL (ref 80.0–100.0)
Monocytes Absolute: 0.6 10*3/uL (ref 0.1–1.0)
Monocytes Relative: 5 %
Neutro Abs: 11 10*3/uL — ABNORMAL HIGH (ref 1.7–7.7)
Neutrophils Relative %: 85 %
Platelets: 276 10*3/uL (ref 150–400)
RBC: 4.5 MIL/uL (ref 3.87–5.11)
RDW: 12.4 % (ref 11.5–15.5)
WBC: 13.1 10*3/uL — ABNORMAL HIGH (ref 4.0–10.5)
nRBC: 0 % (ref 0.0–0.2)

## 2022-10-08 LAB — BASIC METABOLIC PANEL
Anion gap: 13 (ref 5–15)
BUN: 18 mg/dL (ref 8–23)
CO2: 21 mmol/L — ABNORMAL LOW (ref 22–32)
Calcium: 9.9 mg/dL (ref 8.9–10.3)
Chloride: 107 mmol/L (ref 98–111)
Creatinine, Ser: 1.24 mg/dL — ABNORMAL HIGH (ref 0.44–1.00)
GFR, Estimated: 44 mL/min — ABNORMAL LOW (ref >60)
Glucose, Bld: 121 mg/dL — ABNORMAL HIGH (ref 70–99)
Potassium: 3.3 mmol/L — ABNORMAL LOW (ref 3.5–5.1)
Sodium: 141 mmol/L (ref 135–145)

## 2022-10-08 MED ORDER — ONDANSETRON 4 MG PO TBDP
8.0000 mg | ORAL_TABLET | Freq: Once | ORAL | Status: AC
  Administered 2022-10-08: 8 mg via ORAL
  Filled 2022-10-08: qty 2

## 2022-10-08 MED ORDER — MORPHINE SULFATE (PF) 4 MG/ML IV SOLN
4.0000 mg | Freq: Once | INTRAVENOUS | Status: AC
  Administered 2022-10-08: 4 mg via SUBCUTANEOUS
  Filled 2022-10-08: qty 1

## 2022-10-08 MED ORDER — LACTATED RINGERS IV SOLN: INTRAVENOUS | Status: DC

## 2022-10-08 NOTE — ED Notes (Signed)
Patient transported to X-ray 

## 2022-10-08 NOTE — ED Triage Notes (Addendum)
Pt to ED from home via EMS c/o mechanical fall at home, pt missed last step. Pt not on blood thinners. Pt c/o pain to right hip, some deformity noted. No LOC, pt reports hit head on box. Pt uses a walker to assist with ambulation.  Last VS: 134/88, 80hr, 98%RA. CBG 150. No medications given by EMS. Pt A&O X 4

## 2022-10-08 NOTE — ED Provider Notes (Addendum)
[facility] EMERGENCY DEPARTMENT Provider Note   CSN: [id number] Arrival date & time: 10/08/22  1818     History  Chief Complaint  Patient presents with   Fall   Hip Pain    Right     Indigo Barbian Bree is a 81 y.o. female.  81 year old female presents after had mechanical fall just prior to arrival.  [place] onto her right hip.  No loss of consciousness.  Did strike her head.  Denies any head or neck pain currently.  No confusion or emesis.  Patient cannot bear weight onto her right leg.  Denies any right foot, knee, ankle pain.  Called EMS and was transported here       Home Medications Prior to Admission medications   Medication Sig Start Date End Date Taking? Authorizing Provider  amLODipine-olmesartan (AZOR) 10-40 MG per tablet Take 0.25 tablets by mouth daily.     [provider]  aspirin 325 MG tablet Take 650 mg by mouth daily. Take on Monday, Wednesday, and Friday    [provider]  Biotin 5000 MCG CAPS Take 5,000 mcg by mouth daily.    [provider]  Cyanocobalamin (VITAMIN B-12 PO) Take 1 tablet by mouth daily.    [provider]  FLUoxetine (PROZAC) 40 MG capsule Take 40 mg by mouth daily.    [provider]  HYDROcodone-acetaminophen (NORCO) 5-325 MG tablet Take 1-2 tablets by mouth every 6 (six) hours as needed for moderate pain or severe pain. 08/28/15   Fanny Skates, MD  meclizine (ANTIVERT) 25 MG tablet Take 1 or 2 po Q 6hrs for dizziness Patient taking differently: Take 25-50 mg by mouth every 6 (six) hours as needed for dizziness. Take 1 or 2 po Q 6hrs for dizziness 04/04/13   Rolland Porter, MD  Multiple Vitamins-Minerals (ICAPS AREDS 2 PO) Take 1 tablet by mouth daily.    [provider]  omeprazole (PRILOSEC) 20 MG capsule Take 20 mg by mouth daily. Tuesday, Thursday, and Saturday    [provider]  ondansetron (ZOFRAN ODT) 4 MG disintegrating tablet Take 1 tablet (4 mg  total) by mouth every 8 (eight) hours as needed for nausea (or dizziness). 04/04/13   Rolland Porter, MD  oxyCODONE (ROXICODONE) 5 MG immediate release tablet Take 0.5 tablets (2.5 mg total) by mouth at bedtime as needed for up to 8 doses for severe pain. 06/10/21   Arnaldo Natal, MD  topiramate (TOPAMAX) 25 MG tablet TAKE 1 TABLET BY MOUTH DAILY FOR 1 WEEK THEN TAKE 1 TABLET TWICE DAILY 05/31/13   Garvin Fila, MD  VITAMIN D, CHOLECALCIFEROL, PO Take 1 tablet by mouth daily.    [provider]      Allergies    Ambien [zolpidem tartrate], Crestor  [rosuvastatin calcium], Lisinopril, Sulfa antibiotics, and Penicillins    Review of Systems   Review of Systems  All other systems reviewed and are negative.   Physical Exam Updated Vital Signs BP (!) 147/83   Pulse 75   Temp 97.8 F (36.6 C) (Oral)   Resp 16   Ht 1.499 m ('4\' 11"'$ )   Wt 61.2 kg   SpO2 98%   BMI 27.27 kg/m  Physical Exam Vitals and nursing note reviewed.  Constitutional:      General: She is not in acute distress.    Appearance: Normal appearance. She is well-developed. She is not toxic-appearing.  HENT:     Head: Normocephalic and atraumatic.  Eyes:  General: Lids are normal.     Conjunctiva/sclera: Conjunctivae normal.     Pupils: Pupils are equal, round, and reactive to light.  Neck:     Thyroid: No thyroid mass.     Trachea: No tracheal deviation.  Cardiovascular:     Rate and Rhythm: Normal rate and regular rhythm.     Heart sounds: Normal heart sounds. No murmur heard.    No gallop.  Pulmonary:     Effort: Pulmonary effort is normal. No respiratory distress.     Breath sounds: Normal breath sounds. No stridor. No decreased breath sounds, wheezing, rhonchi or rales.  Abdominal:     General: There is no distension.     Palpations: Abdomen is soft.     Tenderness: There is no abdominal tenderness. There is no rebound.  Musculoskeletal:        General: No tenderness. Normal range of motion.      Cervical back: Normal range of motion and neck supple.       Legs:     Comments: Symptoms 5 of 5 in left lower extremity.  Right lower extremity patient unable to raise her leg due to pain.  She is however neurovascular intact at the right foot  Skin:    General: Skin is warm and dry.     Findings: No abrasion or rash.  Neurological:     General: No focal deficit present.     Mental Status: She is alert and oriented to person, place, and time. Mental status is at baseline.     GCS: GCS eye subscore is 4. GCS verbal subscore is 5. GCS motor subscore is 6.     Cranial Nerves: No cranial nerve deficit.     Sensory: No sensory deficit.     Motor: Motor function is intact.  Psychiatric:        Attention and Perception: Attention normal.        Speech: Speech normal.        Behavior: Behavior normal.     ED Results / Procedures / Treatments   Labs (all labs ordered are listed, but only abnormal results are displayed) Labs Reviewed - No data to display  EKG EKG Interpretation  Date/Time:  Thursday October 08 2022 18:26:57 EST Ventricular Rate:  77 PR Interval:  184 QRS Duration: 90 QT Interval:  438 QTC Calculation: 496 R Axis:   -15 Text Interpretation: Sinus rhythm Borderline left axis deviation Anteroseptal infarct, age indeterminate Confirmed by Lacretia Leigh ([id number]) on 10/08/2022 6:55:57 PM  Radiology No results found.  Procedures Procedures    Medications Ordered in ED Medications - No data to display  ED Course/ Medical Decision Making/ A&P                           Medical Decision Making Amount and/or Complexity of Data Reviewed Labs: ordered. Radiology: ordered. ECG/medicine tests: ordered.  Risk Prescription drug management.   Patient medicated for pain here.  Patient's right hip plain films were negative for fracture per interpretation.  Patient unable to ambulate in with severe pain.  She subsequently had a CT of the right hip which was positive  for right femoral neck fracture.  Will consult Dr. Ninfa Linden for orthopedics.  Will admit to the medicine service.  10:28 PM Discussed with Dr. Ninfa Linden and he request that patient be n.p.o. after midnight and he will see the patient tomorrow        Final  Clinical Impression(s) / ED Diagnoses Final diagnoses:  None    Rx / DC Orders ED Discharge Orders     None         Lacretia Leigh, MD 10/08/22 2219    Lacretia Leigh, MD 10/08/22 2231

## 2022-10-08 NOTE — Progress Notes (Signed)
Patient ID: [name], female   DOB: [birth year], 81 y.o.   MRN: [id number] I have seen the patient's plain films and CT scan.  She has an impacted right hip femoral neck fracture.  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  For now, the plan will be to put her on the operating room schedule for tomorrow.  Unfortunately phone call today already have multiple cases posted for tomorrow so we will be adding her on the operating room schedule for sometime into the afternoon tomorrow.  The plan will be for a total hip arthroplasty to treat this fracture.

## 2022-10-08 NOTE — ED Notes (Signed)
Pt refusing IV placement for pain/nausea meds. Zenia Resides MD made aware

## 2022-10-09 ENCOUNTER — Inpatient Hospital Stay (HOSPITAL_COMMUNITY): Payer: Medicare Other | Admitting: Anesthesiology

## 2022-10-09 ENCOUNTER — Other Ambulatory Visit: Payer: Self-pay

## 2022-10-09 ENCOUNTER — Inpatient Hospital Stay (HOSPITAL_COMMUNITY): Payer: Medicare Other

## 2022-10-09 ENCOUNTER — Encounter (HOSPITAL_COMMUNITY)
Admission: EM | Disposition: A | Discharge status: Skilled Nursing Facility | Payer: Self-pay | Source: Home / Self Care | Attending: Internal Medicine

## 2022-10-09 ENCOUNTER — Encounter (HOSPITAL_COMMUNITY): Payer: Self-pay | Admitting: Internal Medicine

## 2022-10-09 DIAGNOSIS — S72041A Displaced fracture of base of neck of right femur, initial encounter for closed fracture: Secondary | ICD-10-CM | POA: Diagnosis not present

## 2022-10-09 DIAGNOSIS — I129 Hypertensive chronic kidney disease with stage 1 through stage 4 chronic kidney disease, or unspecified chronic kidney disease: Secondary | ICD-10-CM | POA: Diagnosis present

## 2022-10-09 DIAGNOSIS — S72001A Fracture of unspecified part of neck of right femur, initial encounter for closed fracture: Secondary | ICD-10-CM

## 2022-10-09 DIAGNOSIS — Z96641 Presence of right artificial hip joint: Secondary | ICD-10-CM | POA: Diagnosis not present

## 2022-10-09 DIAGNOSIS — Z8673 Personal history of transient ischemic attack (TIA), and cerebral infarction without residual deficits: Secondary | ICD-10-CM

## 2022-10-09 DIAGNOSIS — Z743 Need for continuous supervision: Secondary | ICD-10-CM | POA: Diagnosis not present

## 2022-10-09 DIAGNOSIS — I10 Essential (primary) hypertension: Secondary | ICD-10-CM | POA: Diagnosis not present

## 2022-10-09 DIAGNOSIS — R9431 Abnormal electrocardiogram [ECG] [EKG]: Secondary | ICD-10-CM | POA: Diagnosis present

## 2022-10-09 DIAGNOSIS — Z888 Allergy status to other drugs, medicaments and biological substances status: Secondary | ICD-10-CM | POA: Diagnosis not present

## 2022-10-09 DIAGNOSIS — E876 Hypokalemia: Secondary | ICD-10-CM | POA: Diagnosis not present

## 2022-10-09 DIAGNOSIS — S72031A Displaced midcervical fracture of right femur, initial encounter for closed fracture: Secondary | ICD-10-CM | POA: Diagnosis present

## 2022-10-09 DIAGNOSIS — F32A Depression, unspecified: Secondary | ICD-10-CM | POA: Diagnosis present

## 2022-10-09 DIAGNOSIS — Y92015 Private garage of single-family (private) house as the place of occurrence of the external cause: Secondary | ICD-10-CM | POA: Diagnosis not present

## 2022-10-09 DIAGNOSIS — Z880 Allergy status to penicillin: Secondary | ICD-10-CM | POA: Diagnosis not present

## 2022-10-09 DIAGNOSIS — M1990 Unspecified osteoarthritis, unspecified site: Secondary | ICD-10-CM | POA: Diagnosis not present

## 2022-10-09 DIAGNOSIS — Z4789 Encounter for other orthopedic aftercare: Secondary | ICD-10-CM | POA: Diagnosis not present

## 2022-10-09 DIAGNOSIS — K219 Gastro-esophageal reflux disease without esophagitis: Secondary | ICD-10-CM | POA: Diagnosis present

## 2022-10-09 DIAGNOSIS — M25551 Pain in right hip: Secondary | ICD-10-CM | POA: Diagnosis not present

## 2022-10-09 DIAGNOSIS — Z882 Allergy status to sulfonamides status: Secondary | ICD-10-CM | POA: Diagnosis not present

## 2022-10-09 DIAGNOSIS — I4581 Long QT syndrome: Secondary | ICD-10-CM | POA: Diagnosis not present

## 2022-10-09 DIAGNOSIS — W109XXA Fall (on) (from) unspecified stairs and steps, initial encounter: Secondary | ICD-10-CM | POA: Diagnosis present

## 2022-10-09 DIAGNOSIS — M8430XA Stress fracture, unspecified site, initial encounter for fracture: Secondary | ICD-10-CM | POA: Diagnosis not present

## 2022-10-09 DIAGNOSIS — S72009A Fracture of unspecified part of neck of unspecified femur, initial encounter for closed fracture: Secondary | ICD-10-CM | POA: Diagnosis present

## 2022-10-09 DIAGNOSIS — S72041D Displaced fracture of base of neck of right femur, subsequent encounter for closed fracture with routine healing: Secondary | ICD-10-CM | POA: Diagnosis not present

## 2022-10-09 DIAGNOSIS — Z7982 Long term (current) use of aspirin: Secondary | ICD-10-CM | POA: Diagnosis not present

## 2022-10-09 DIAGNOSIS — D631 Anemia in chronic kidney disease: Secondary | ICD-10-CM | POA: Diagnosis present

## 2022-10-09 DIAGNOSIS — D72829 Elevated white blood cell count, unspecified: Secondary | ICD-10-CM | POA: Diagnosis not present

## 2022-10-09 DIAGNOSIS — S72009D Fracture of unspecified part of neck of unspecified femur, subsequent encounter for closed fracture with routine healing: Secondary | ICD-10-CM | POA: Diagnosis not present

## 2022-10-09 DIAGNOSIS — Z471 Aftercare following joint replacement surgery: Secondary | ICD-10-CM | POA: Diagnosis not present

## 2022-10-09 DIAGNOSIS — N1831 Chronic kidney disease, stage 3a: Secondary | ICD-10-CM | POA: Diagnosis present

## 2022-10-09 DIAGNOSIS — Z79899 Other long term (current) drug therapy: Secondary | ICD-10-CM | POA: Diagnosis not present

## 2022-10-09 HISTORY — PX: TOTAL HIP ARTHROPLASTY: SHX124

## 2022-10-09 LAB — BASIC METABOLIC PANEL
Anion gap: 10 (ref 5–15)
BUN: 13 mg/dL (ref 8–23)
CO2: 23 mmol/L (ref 22–32)
Calcium: 9.3 mg/dL (ref 8.9–10.3)
Chloride: 109 mmol/L (ref 98–111)
Creatinine, Ser: 1.04 mg/dL — ABNORMAL HIGH (ref 0.44–1.00)
GFR, Estimated: 54 mL/min — ABNORMAL LOW (ref >60)
Glucose, Bld: 96 mg/dL (ref 70–99)
Potassium: 3.9 mmol/L (ref 3.5–5.1)
Sodium: 142 mmol/L (ref 135–145)

## 2022-10-09 LAB — CBC
HCT: 34.9 % — ABNORMAL LOW (ref 36.0–46.0)
Hemoglobin: 11.8 g/dL — ABNORMAL LOW (ref 12.0–15.0)
MCH: 29.9 pg (ref 26.0–34.0)
MCHC: 33.8 g/dL (ref 30.0–36.0)
MCV: 88.4 fL (ref 80.0–100.0)
Platelets: 226 10*3/uL (ref 150–400)
RBC: 3.95 MIL/uL (ref 3.87–5.11)
RDW: 12.4 % (ref 11.5–15.5)
WBC: 9.1 10*3/uL (ref 4.0–10.5)
nRBC: 0 % (ref 0.0–0.2)

## 2022-10-09 LAB — URINALYSIS, MICROSCOPIC (REFLEX)

## 2022-10-09 LAB — URINALYSIS, ROUTINE W REFLEX MICROSCOPIC
Bilirubin Urine: NEGATIVE
Glucose, UA: NEGATIVE mg/dL
Hgb urine dipstick: NEGATIVE
Ketones, ur: NEGATIVE mg/dL
Nitrite: NEGATIVE
Protein, ur: NEGATIVE mg/dL
Specific Gravity, Urine: 1.026 (ref 1.005–1.030)
pH: 5 (ref 5.0–8.0)

## 2022-10-09 LAB — ABO/RH: ABO/RH(D): A POS

## 2022-10-09 LAB — SURGICAL PCR SCREEN
MRSA, PCR: NEGATIVE
Staphylococcus aureus: NEGATIVE

## 2022-10-09 LAB — TYPE AND SCREEN
ABO/RH(D): A POS
Antibody Screen: NEGATIVE

## 2022-10-09 LAB — TROPONIN I (HIGH SENSITIVITY): Troponin I (High Sensitivity): 12 ng/L (ref <18)

## 2022-10-09 LAB — MAGNESIUM: Magnesium: 1.8 mg/dL (ref 1.7–2.4)

## 2022-10-09 SURGERY — ARTHROPLASTY, HIP, TOTAL, ANTERIOR APPROACH
Anesthesia: General | Site: Hip | Laterality: Right

## 2022-10-09 MED ORDER — LACTATED RINGERS IV SOLN: INTRAVENOUS | Status: DC

## 2022-10-09 MED ORDER — MUPIROCIN 2 % EX OINT
1.0000 | TOPICAL_OINTMENT | Freq: Two times a day (BID) | CUTANEOUS | Status: DC
  Administered 2022-10-09 – 2022-10-13 (×8): 1 via NASAL
  Filled 2022-10-09 (×2): qty 22

## 2022-10-09 MED ORDER — FENTANYL CITRATE (PF) 100 MCG/2ML IJ SOLN: 25.0000 ug | INTRAMUSCULAR | Status: DC | PRN

## 2022-10-09 MED ORDER — DOCUSATE SODIUM 100 MG PO CAPS
100.0000 mg | ORAL_CAPSULE | Freq: Two times a day (BID) | ORAL | Status: DC
  Administered 2022-10-12 – 2022-10-13 (×2): 100 mg via ORAL
  Filled 2022-10-09 (×7): qty 1

## 2022-10-09 MED ORDER — VANCOMYCIN HCL 1000 MG IV SOLR
INTRAVENOUS | Status: DC | PRN
  Administered 2022-10-09: 1000 mg via INTRAVENOUS

## 2022-10-09 MED ORDER — PHENYLEPHRINE 80 MCG/ML (10ML) SYRINGE FOR IV PUSH (FOR BLOOD PRESSURE SUPPORT)
PREFILLED_SYRINGE | INTRAVENOUS | Status: AC
  Filled 2022-10-09: qty 10

## 2022-10-09 MED ORDER — ROCURONIUM BROMIDE 10 MG/ML (PF) SYRINGE
PREFILLED_SYRINGE | INTRAVENOUS | Status: AC
  Filled 2022-10-09: qty 10

## 2022-10-09 MED ORDER — MEPERIDINE HCL 25 MG/ML IJ SOLN: 6.2500 mg | INTRAMUSCULAR | Status: DC | PRN

## 2022-10-09 MED ORDER — MENTHOL 3 MG MT LOZG
1.0000 | LOZENGE | OROMUCOSAL | Status: DC | PRN
  Administered 2022-10-09: 3 mg via ORAL
  Filled 2022-10-09: qty 9

## 2022-10-09 MED ORDER — OXYCODONE HCL 5 MG/5ML PO SOLN: 5.0000 mg | Freq: Once | ORAL | Status: DC | PRN

## 2022-10-09 MED ORDER — HYDROCODONE-ACETAMINOPHEN 5-325 MG PO TABS
1.0000 | ORAL_TABLET | ORAL | Status: DC | PRN
  Administered 2022-10-09: 2 via ORAL
  Administered 2022-10-10: 1 via ORAL
  Administered 2022-10-10 – 2022-10-13 (×5): 2 via ORAL
  Filled 2022-10-09 (×2): qty 2

## 2022-10-09 MED ORDER — VANCOMYCIN HCL 1000 MG IV SOLR
INTRAVENOUS | Status: AC
  Filled 2022-10-09: qty 20

## 2022-10-09 MED ORDER — HYDROCODONE-ACETAMINOPHEN 5-325 MG PO TABS
1.0000 | ORAL_TABLET | Freq: Four times a day (QID) | ORAL | Status: DC | PRN
  Administered 2022-10-09: 2 via ORAL
  Administered 2022-10-11: 1 via ORAL
  Filled 2022-10-09 (×2): qty 2
  Filled 2022-10-09: qty 1
  Filled 2022-10-09 (×4): qty 2

## 2022-10-09 MED ORDER — LABETALOL HCL 5 MG/ML IV SOLN
INTRAVENOUS | Status: AC
  Filled 2022-10-09: qty 4

## 2022-10-09 MED ORDER — ALBUTEROL SULFATE HFA 108 (90 BASE) MCG/ACT IN AERS
INHALATION_SPRAY | RESPIRATORY_TRACT | Status: DC | PRN
  Administered 2022-10-09: 6 via RESPIRATORY_TRACT

## 2022-10-09 MED ORDER — METOCLOPRAMIDE HCL 5 MG PO TABS: 5.0000 mg | ORAL_TABLET | Freq: Three times a day (TID) | ORAL | Status: DC | PRN

## 2022-10-09 MED ORDER — FLUOXETINE HCL 20 MG PO CAPS
40.0000 mg | ORAL_CAPSULE | Freq: Every day | ORAL | Status: DC
  Administered 2022-10-09 – 2022-10-13 (×5): 40 mg via ORAL
  Filled 2022-10-09 (×5): qty 2

## 2022-10-09 MED ORDER — TRANEXAMIC ACID-NACL 1000-0.7 MG/100ML-% IV SOLN
INTRAVENOUS | Status: DC | PRN
  Administered 2022-10-09: 1000 mg via INTRAVENOUS

## 2022-10-09 MED ORDER — ASPIRIN 81 MG PO CHEW
81.0000 mg | CHEWABLE_TABLET | Freq: Two times a day (BID) | ORAL | Status: DC
  Administered 2022-10-09 – 2022-10-13 (×8): 81 mg via ORAL
  Filled 2022-10-09 (×8): qty 1

## 2022-10-09 MED ORDER — HYDROCODONE-ACETAMINOPHEN 7.5-325 MG PO TABS: 1.0000 | ORAL_TABLET | ORAL | Status: DC | PRN

## 2022-10-09 MED ORDER — ACETAMINOPHEN 160 MG/5ML PO SOLN: 325.0000 mg | ORAL | Status: DC | PRN

## 2022-10-09 MED ORDER — ENSURE ENLIVE PO LIQD
237.0000 mL | Freq: Two times a day (BID) | ORAL | Status: DC
  Administered 2022-10-10 – 2022-10-12 (×4): 237 mL via ORAL

## 2022-10-09 MED ORDER — TRANEXAMIC ACID-NACL 1000-0.7 MG/100ML-% IV SOLN
INTRAVENOUS | Status: AC
  Filled 2022-10-09: qty 100

## 2022-10-09 MED ORDER — SODIUM CHLORIDE 0.9 % IV SOLN: INTRAVENOUS | Status: DC

## 2022-10-09 MED ORDER — ADULT MULTIVITAMIN W/MINERALS CH
1.0000 | ORAL_TABLET | Freq: Every day | ORAL | Status: DC
  Administered 2022-10-09 – 2022-10-13 (×5): 1 via ORAL
  Filled 2022-10-09 (×5): qty 1

## 2022-10-09 MED ORDER — ONDANSETRON HCL 4 MG/2ML IJ SOLN
INTRAMUSCULAR | Status: AC
  Filled 2022-10-09: qty 2

## 2022-10-09 MED ORDER — CHLORHEXIDINE GLUCONATE 0.12 % MT SOLN
15.0000 mL | Freq: Once | OROMUCOSAL | Status: AC
  Administered 2022-10-09: 15 mL via OROMUCOSAL
  Filled 2022-10-09: qty 15

## 2022-10-09 MED ORDER — METOCLOPRAMIDE HCL 5 MG/ML IJ SOLN: 5.0000 mg | Freq: Three times a day (TID) | INTRAMUSCULAR | Status: DC | PRN

## 2022-10-09 MED ORDER — PROPOFOL 10 MG/ML IV BOLUS
INTRAVENOUS | Status: DC | PRN
  Administered 2022-10-09: 80 mg via INTRAVENOUS
  Administered 2022-10-09: 20 mg via INTRAVENOUS

## 2022-10-09 MED ORDER — ALUM & MAG HYDROXIDE-SIMETH 200-200-20 MG/5ML PO SUSP: 30.0000 mL | ORAL | Status: DC | PRN

## 2022-10-09 MED ORDER — SUGAMMADEX SODIUM 200 MG/2ML IV SOLN
INTRAVENOUS | Status: DC | PRN
  Administered 2022-10-09: 200 mg via INTRAVENOUS

## 2022-10-09 MED ORDER — DEXAMETHASONE SODIUM PHOSPHATE 10 MG/ML IJ SOLN
INTRAMUSCULAR | Status: AC
  Filled 2022-10-09: qty 1

## 2022-10-09 MED ORDER — 0.9 % SODIUM CHLORIDE (POUR BTL) OPTIME
TOPICAL | Status: DC | PRN
  Administered 2022-10-09: 1000 mL

## 2022-10-09 MED ORDER — PHENOL 1.4 % MT LIQD: 1.0000 | OROMUCOSAL | Status: DC | PRN

## 2022-10-09 MED ORDER — ACETAMINOPHEN 325 MG PO TABS: 325.0000 mg | ORAL_TABLET | Freq: Four times a day (QID) | ORAL | Status: DC | PRN

## 2022-10-09 MED ORDER — POTASSIUM CHLORIDE CRYS ER 20 MEQ PO TBCR
40.0000 meq | EXTENDED_RELEASE_TABLET | Freq: Once | ORAL | Status: AC
  Administered 2022-10-09: 40 meq via ORAL
  Filled 2022-10-09: qty 2

## 2022-10-09 MED ORDER — ORAL CARE MOUTH RINSE: 15.0000 mL | Freq: Once | OROMUCOSAL | Status: AC

## 2022-10-09 MED ORDER — METHOCARBAMOL 500 MG PO TABS
500.0000 mg | ORAL_TABLET | Freq: Four times a day (QID) | ORAL | Status: DC | PRN
  Administered 2022-10-09 – 2022-10-11 (×3): 500 mg via ORAL
  Filled 2022-10-09 (×2): qty 1

## 2022-10-09 MED ORDER — MORPHINE SULFATE (PF) 2 MG/ML IV SOLN: 0.5000 mg | INTRAVENOUS | Status: DC | PRN

## 2022-10-09 MED ORDER — ROCURONIUM BROMIDE 10 MG/ML (PF) SYRINGE
PREFILLED_SYRINGE | INTRAVENOUS | Status: DC | PRN
  Administered 2022-10-09: 60 mg via INTRAVENOUS

## 2022-10-09 MED ORDER — CEFAZOLIN SODIUM-DEXTROSE 2-4 GM/100ML-% IV SOLN
INTRAVENOUS | Status: AC
  Filled 2022-10-09: qty 100

## 2022-10-09 MED ORDER — ACETAMINOPHEN 325 MG PO TABS: 325.0000 mg | ORAL_TABLET | ORAL | Status: DC | PRN

## 2022-10-09 MED ORDER — DEXMEDETOMIDINE HCL IN NACL 80 MCG/20ML IV SOLN
INTRAVENOUS | Status: AC
  Filled 2022-10-09: qty 20

## 2022-10-09 MED ORDER — VANCOMYCIN HCL IN DEXTROSE 1-5 GM/200ML-% IV SOLN
1000.0000 mg | Freq: Two times a day (BID) | INTRAVENOUS | Status: AC
  Administered 2022-10-09: 1000 mg via INTRAVENOUS
  Filled 2022-10-09: qty 200

## 2022-10-09 MED ORDER — LIDOCAINE 2% (20 MG/ML) 5 ML SYRINGE
INTRAMUSCULAR | Status: DC | PRN
  Administered 2022-10-09: 100 mg via INTRAVENOUS

## 2022-10-09 MED ORDER — DEXAMETHASONE SODIUM PHOSPHATE 10 MG/ML IJ SOLN
INTRAMUSCULAR | Status: DC | PRN
  Administered 2022-10-09: 8 mg via INTRAVENOUS

## 2022-10-09 MED ORDER — FENTANYL CITRATE (PF) 250 MCG/5ML IJ SOLN
INTRAMUSCULAR | Status: AC
  Filled 2022-10-09: qty 5

## 2022-10-09 MED ORDER — ONDANSETRON HCL 4 MG/2ML IJ SOLN
INTRAMUSCULAR | Status: DC | PRN
  Administered 2022-10-09: 4 mg via INTRAVENOUS

## 2022-10-09 MED ORDER — LABETALOL HCL 5 MG/ML IV SOLN
INTRAVENOUS | Status: DC | PRN
  Administered 2022-10-09 (×3): 2.5 mg via INTRAVENOUS

## 2022-10-09 MED ORDER — OXYCODONE HCL 5 MG PO TABS: 5.0000 mg | ORAL_TABLET | Freq: Once | ORAL | Status: DC | PRN

## 2022-10-09 MED ORDER — PROPOFOL 10 MG/ML IV BOLUS
INTRAVENOUS | Status: AC
  Filled 2022-10-09: qty 20

## 2022-10-09 MED ORDER — FENTANYL CITRATE (PF) 250 MCG/5ML IJ SOLN
INTRAMUSCULAR | Status: DC | PRN
  Administered 2022-10-09 (×5): 50 ug via INTRAVENOUS

## 2022-10-09 MED ORDER — METHOCARBAMOL 1000 MG/10ML IJ SOLN: 500.0000 mg | Freq: Four times a day (QID) | INTRAVENOUS | Status: DC | PRN

## 2022-10-09 MED ORDER — PANTOPRAZOLE SODIUM 40 MG PO TBEC
40.0000 mg | DELAYED_RELEASE_TABLET | Freq: Every day | ORAL | Status: DC
  Administered 2022-10-09 – 2022-10-13 (×5): 40 mg via ORAL
  Filled 2022-10-09 (×4): qty 1

## 2022-10-09 MED ORDER — NALOXONE HCL 0.4 MG/ML IJ SOLN: 0.4000 mg | INTRAMUSCULAR | Status: DC | PRN

## 2022-10-09 MED ORDER — METHOCARBAMOL 500 MG PO TABS
500.0000 mg | ORAL_TABLET | Freq: Four times a day (QID) | ORAL | Status: DC | PRN
  Administered 2022-10-10: 500 mg via ORAL
  Filled 2022-10-09 (×2): qty 1

## 2022-10-09 SURGICAL SUPPLY — 52 items
BAG COUNTER SPONGE SURGICOUNT (BAG) ×1 IMPLANT
BENZOIN TINCTURE PRP APPL 2/3 (GAUZE/BANDAGES/DRESSINGS) ×1 IMPLANT
BLADE CLIPPER SURG (BLADE) IMPLANT
BLADE SAW SGTL 18X1.27X75 (BLADE) ×1 IMPLANT
COVER SURGICAL LIGHT HANDLE (MISCELLANEOUS) ×1 IMPLANT
CUP SECTOR GRIPTON 50MM (Cup) IMPLANT
DRAPE C-ARM 42X72 X-RAY (DRAPES) ×1 IMPLANT
DRAPE STERI IOBAN 125X83 (DRAPES) ×1 IMPLANT
DRAPE U-SHAPE 47X51 STRL (DRAPES) ×3 IMPLANT
DRSG AQUACEL AG ADV 3.5X10 (GAUZE/BANDAGES/DRESSINGS) ×1 IMPLANT
DURAPREP 26ML APPLICATOR (WOUND CARE) ×1 IMPLANT
ELECT BLADE 4.0 EZ CLEAN MEGAD (MISCELLANEOUS) ×1
ELECT BLADE 6.5 EXT (BLADE) IMPLANT
ELECT REM PT RETURN 9FT ADLT (ELECTROSURGICAL) ×1
ELECTRODE BLDE 4.0 EZ CLN MEGD (MISCELLANEOUS) ×1 IMPLANT
ELECTRODE REM PT RTRN 9FT ADLT (ELECTROSURGICAL) ×1 IMPLANT
FACESHIELD WRAPAROUND (MASK) ×2 IMPLANT
FACESHIELD WRAPAROUND OR TEAM (MASK) ×2 IMPLANT
GLOVE BIOGEL PI IND STRL 8 (GLOVE) ×2 IMPLANT
GLOVE ECLIPSE 8.0 STRL XLNG CF (GLOVE) ×1 IMPLANT
GLOVE ORTHO TXT STRL SZ7.5 (GLOVE) ×2 IMPLANT
GOWN STRL REUS W/ TWL LRG LVL3 (GOWN DISPOSABLE) ×2 IMPLANT
GOWN STRL REUS W/ TWL XL LVL3 (GOWN DISPOSABLE) ×2 IMPLANT
GOWN STRL REUS W/TWL LRG LVL3 (GOWN DISPOSABLE) ×2
GOWN STRL REUS W/TWL XL LVL3 (GOWN DISPOSABLE) ×2
HANDPIECE INTERPULSE COAX TIP (DISPOSABLE) ×1
HEAD FEM STD 32X+1 STRL (Hips) IMPLANT
KIT BASIN OR (CUSTOM PROCEDURE TRAY) ×1 IMPLANT
KIT TURNOVER KIT B (KITS) ×1 IMPLANT
LINER ACETABULAR 32X50 (Liner) IMPLANT
MANIFOLD NEPTUNE II (INSTRUMENTS) ×1 IMPLANT
NS IRRIG 1000ML POUR BTL (IV SOLUTION) ×1 IMPLANT
PACK TOTAL JOINT (CUSTOM PROCEDURE TRAY) ×1 IMPLANT
PAD ARMBOARD 7.5X6 YLW CONV (MISCELLANEOUS) ×1 IMPLANT
SET HNDPC FAN SPRY TIP SCT (DISPOSABLE) ×1 IMPLANT
STAPLER VISISTAT 35W (STAPLE) IMPLANT
STEM FEM SZ3 STD ACTIS (Stem) IMPLANT
STRIP CLOSURE SKIN 1/2X4 (GAUZE/BANDAGES/DRESSINGS) ×2 IMPLANT
SUT ETHIBOND NAB CT1 #1 30IN (SUTURE) ×1 IMPLANT
SUT MNCRL AB 4-0 PS2 18 (SUTURE) IMPLANT
SUT VIC AB 0 CT1 27 (SUTURE) ×1
SUT VIC AB 0 CT1 27XBRD ANBCTR (SUTURE) ×1 IMPLANT
SUT VIC AB 1 CT1 27 (SUTURE) ×1
SUT VIC AB 1 CT1 27XBRD ANBCTR (SUTURE) ×1 IMPLANT
SUT VIC AB 2-0 CT1 27 (SUTURE) ×2
SUT VIC AB 2-0 CT1 TAPERPNT 27 (SUTURE) ×1 IMPLANT
TOWEL GREEN STERILE (TOWEL DISPOSABLE) ×1 IMPLANT
TOWEL GREEN STERILE FF (TOWEL DISPOSABLE) ×1 IMPLANT
TRAY CATH 16FR W/PLASTIC CATH (SET/KITS/TRAYS/PACK) IMPLANT
TRAY FOLEY W/BAG SLVR 16FR (SET/KITS/TRAYS/PACK)
TRAY FOLEY W/BAG SLVR 16FR ST (SET/KITS/TRAYS/PACK) IMPLANT
WATER STERILE IRR 1000ML POUR (IV SOLUTION) ×2 IMPLANT

## 2022-10-09 NOTE — Progress Notes (Signed)
No charge note  Patient seen and examined this morning, admitted overnight, H&P reviewed and agree with the assessment and plan  This is an 81 year old female with prior CVA in 2012, HTN, depression, who comes into the hospital after ground-level fall.  She was at her daughter's house, just finished Thanksgiving dinner, and upon going down 2 steps into the garage she fell to the floor.  She felt right hip pain and inability to ambulate and was brought to the hospital.  She was found to have right hip fracture, orthopedic surgery was consulted  Principal problem Acute displaced and impacted right femoral neck fracture -Secondary to a mechanical fall.  Orthopedic surgery consulted, plans for operative repair today.  Active problems Mild leukocytosis -likely active, monitor.  WBC normalized this morning   Mild hypokalemia -Potassium improved with repletion   History of CVA -Hold aspirin at this time, resume postop   Hypertension -Systolic currently in the low 140s. Per pharmacy med rec, patient is not taking any antihypertensives.  Continue pain management and monitor blood pressure closely.  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  Creatinine on admission 1.2, 1.0 this morning.  Depression -Continue Prozac  Scheduled Meds:  FLUoxetine  40 mg Oral Daily   mupirocin ointment  1 Application Nasal BID   Continuous Infusions:  methocarbamol (ROBAXIN) IV     PRN Meds:.HYDROcodone-acetaminophen, methocarbamol **OR** methocarbamol (ROBAXIN) IV, morphine injection, naLOXone (NARCAN)  injection   Elizabeth Ronda M. Cruzita Lederer, MD, PhD [facility]  Between 7 am - 7 pm you can contact me via Amion (for emergencies) or [place] (non urgent matters).  I am not available 7 pm - 7 am, please contact night coverage MD/APP via Amion

## 2022-10-09 NOTE — Consult Note (Signed)
Reason for Consult: Right hip femoral neck fracture Referring Physician: Vivi Martens, MD/ADP  [name] is an 81 y.o. female.  HPI: The patient is an 81 year old female who unfortunately sustained a mechanical fall after accidentally tripping yesterday.  She landed on her right hip and had the inability to ambulate after that with severe right hip pain.  She was brought to the [facility] emergency room and found to have a right hip transcervical femoral neck fracture.  Orthopedic surgery was consulted to address this injury.  I am seeing the patient at the bedside now.  She reports only right hip pain.  She lives alone but has family that lives near her.  They know that she is here.  She has no other significant issues currently.  She is not on blood thinning medication.  She denies any loss of consciousness.  She also denies any chest pain or shortness of breath.  She only reports significant right hip pain.  She does ambulate regularly and drives.  Past Medical History:  Diagnosis Date   Arthritis    CVA (cerebral vascular accident) ([place])    Depression    GERD (gastroesophageal reflux disease)    Hypertension     Past Surgical History:  Procedure Laterality Date   CHOLECYSTECTOMY N/A 08/28/2015   Procedure: LAPAROSCOPIC CHOLECYSTECTOMY;  Surgeon: Fanny Skates, MD;  Location: [facility] OR;  Service: General;  Laterality: N/A;   HIATAL HERNIA REPAIR     WISDOM TOOTH EXTRACTION      Family History  Problem Relation Age of Onset   Breast cancer Neg Hx     Social History:  reports that she has never smoked. She has never used smokeless tobacco. She reports that she does not drink alcohol and does not use drugs.  Allergies:  Allergies  Allergen Reactions   Ambien [Zolpidem Tartrate] Other (See Comments)    Very dizzy   Crestor  [Rosuvastatin Calcium] Other (See Comments)    Myalgia   Lisinopril Cough   Sulfa Antibiotics Nausea And Vomiting   Penicillins Rash    Has patient had  a PCN reaction causing immediate rash, facial/tongue/throat swelling, SOB or lightheadedness with hypotension: Yes Has patient had a PCN reaction causing severe rash involving mucus membranes or skin necrosis: No Has patient had a PCN reaction that required hospitalization No Has patient had a PCN reaction occurring within the last 10 years: No If all of the above answers are "NO", then may proceed with Cephalosporin    Medications: I have reviewed the patient's current medications.  Results for orders placed or performed during the hospital encounter of 10/08/22 (from the past 48 hour(s))  CBC with Differential/Platelet     Status: Abnormal   Collection Time: 10/08/22 10:28 PM  Result Value Ref Range   WBC 13.1 (H) 4.0 - 10.5 K/uL   RBC 4.50 3.87 - 5.11 MIL/uL   Hemoglobin 13.7 12.0 - 15.0 g/dL   HCT 39.6 36.0 - 46.0 %   MCV 88.0 80.0 - 100.0 fL   MCH 30.4 26.0 - 34.0 pg   MCHC 34.6 30.0 - 36.0 g/dL   RDW 12.4 11.5 - 15.5 %   Platelets 276 150 - 400 K/uL   nRBC 0.0 0.0 - 0.2 %   Neutrophils Relative % 85 %   Neutro Abs 11.0 (H) 1.7 - 7.7 K/uL   Lymphocytes Relative 10 %   Lymphs Abs 1.3 0.7 - 4.0 K/uL   Monocytes Relative 5 %   Monocytes  Absolute 0.6 0.1 - 1.0 K/uL   Eosinophils Relative 0 %   Eosinophils Absolute 0.0 0.0 - 0.5 K/uL   Basophils Relative 0 %   Basophils Absolute 0.0 0.0 - 0.1 K/uL   Immature Granulocytes 0 %   Abs Immature Granulocytes 0.04 0.00 - 0.07 K/uL    Comment: Performed at [facility], [place] [address] [place], [place] [address]  Basic metabolic panel     Status: Abnormal   Collection Time: 10/08/22 10:28 PM  Result Value Ref Range   Sodium 141 135 - 145 mmol/L   Potassium 3.3 (L) 3.5 - 5.1 mmol/L   Chloride 107 98 - 111 mmol/L   CO2 21 (L) 22 - 32 mmol/L   Glucose, Bld 121 (H) 70 - 99 mg/dL    Comment: Glucose reference range applies only to samples taken after fasting for at least 8 hours.   BUN 18 8 - 23 mg/dL   Creatinine, Ser 1.24 (H)  0.44 - 1.00 mg/dL   Calcium 9.9 8.9 - 10.3 mg/dL   GFR, Estimated 44 (L) >60 mL/min    Comment: (NOTE) Calculated using the CKD-EPI Creatinine Equation (2021)    Anion gap 13 5 - 15    Comment: Performed at [facility] [address] [place], [place] [address]  Troponin I (High Sensitivity)     Status: None   Collection Time: 10/09/22 12:36 AM  Result Value Ref Range   Troponin I (High Sensitivity) 12 <18 ng/L    Comment: (NOTE) Elevated high sensitivity troponin I (hsTnI) values and significant  changes across serial measurements may suggest ACS but many other  chronic and acute conditions are known to elevate hsTnI results.  Refer to the "Links" section for chest pain algorithms and additional  guidance. Performed at [facility], [place] [address] [place], [place] [address]   Surgical PCR screen     Status: None   Collection Time: 10/09/22  6:50 AM   Specimen: Nasal Mucosa; Nasal Swab  Result Value Ref Range   MRSA, PCR NEGATIVE NEGATIVE   Staphylococcus aureus NEGATIVE NEGATIVE    Comment: (NOTE) The Xpert SA Assay (FDA approved for NASAL specimens in patients 100 years of age and older), is one component of a comprehensive surveillance program. It is not intended to diagnose infection nor to guide or monitor treatment. Performed at [facility], [place] [address] [place], [place] [address]   CBC     Status: Abnormal   Collection Time: 10/09/22  7:09 AM  Result Value Ref Range   WBC 9.1 4.0 - 10.5 K/uL   RBC 3.95 3.87 - 5.11 MIL/uL   Hemoglobin 11.8 (L) 12.0 - 15.0 g/dL   HCT 34.9 (L) 36.0 - 46.0 %   MCV 88.4 80.0 - 100.0 fL   MCH 29.9 26.0 - 34.0 pg   MCHC 33.8 30.0 - 36.0 g/dL   RDW 12.4 11.5 - 15.5 %   Platelets 226 150 - 400 K/uL   nRBC 0.0 0.0 - 0.2 %    Comment: Performed at [facility], [place] [address], [place], [place] [address]  Basic metabolic panel     Status: Abnormal   Collection Time: 10/09/22  7:09 AM  Result Value Ref Range    Sodium 142 135 - 145 mmol/L   Potassium 3.9 3.5 - 5.1 mmol/L   Chloride 109 98 - 111 mmol/L   CO2 23 22 - 32 mmol/L  Glucose, Bld 96 70 - 99 mg/dL    Comment: Glucose reference range applies only to samples taken after fasting for at least 8 hours.   BUN 13 8 - 23 mg/dL   Creatinine, Ser 1.04 (H) 0.44 - 1.00 mg/dL   Calcium 9.3 8.9 - 10.3 mg/dL   GFR, Estimated 54 (L) >60 mL/min    Comment: (NOTE) Calculated using the CKD-EPI Creatinine Equation (2021)    Anion gap 10 5 - 15    Comment: Performed at [facility] [address], [place], [place] [address]  Magnesium     Status: None   Collection Time: 10/09/22  7:09 AM  Result Value Ref Range   Magnesium 1.8 1.7 - 2.4 mg/dL    Comment: Performed at [facility] [address], [place],  [address]    CT Hip Right Wo Contrast  Result Date: 10/08/2022 CLINICAL DATA:  Hip trauma, fracture suspected, no prior imaging EXAM: CT OF THE RIGHT HIP WITHOUT CONTRAST TECHNIQUE: Multidetector CT imaging of the right hip was performed according to the standard protocol. Multiplanar CT image reconstructions were also generated. RADIATION DOSE REDUCTION: This exam was performed according to the departmental dose-optimization program which includes automated exposure control, adjustment of the mA and/or kV according to patient size and/or use of iterative reconstruction technique. COMPARISON:  X-ray right hip 10/08/2022 FINDINGS: Bones/Joint/Cartilage Acute displaced and impacted right femoral neck fracture. No other acute fracture of the right hip identified. No right hip dislocation. Ligaments Suboptimally assessed by CT. Muscles and Tendons Grossly unremarkable. Soft tissues No large hematoma formation. IMPRESSION: Acute displaced and impacted right femoral neck fracture. Electronically Signed   By: Iven Finn M.D.   On: 10/08/2022 22:02   DG Chest 2 View  Result Date: 10/08/2022 CLINICAL DATA:  Shortness of breath. Fall  at home. EXAM: CHEST - 2 VIEW COMPARISON:  Remote CT 09/25/2009 esophagram 06/26/2015 FINDINGS: Normal heart size. There is a retrocardiac hiatal hernia. The lungs are clear. Pulmonary vasculature is normal. No consolidation, pleural effusion, or pneumothorax. No acute osseous abnormalities are seen. IMPRESSION: 1. No acute chest findings. 2. Hiatal hernia. Electronically Signed   By: Keith Rake M.D.   On: 10/08/2022 19:30   DG Hip Unilat  With Pelvis 2-3 Views Right  Result Date: 10/08/2022 CLINICAL DATA:  Fall EXAM: DG HIP (WITH OR WITHOUT PELVIS) 2-3V RIGHT COMPARISON:  None Available. FINDINGS: Degenerative changes in the hips bilaterally with joint space narrowing and spurring. Degenerative changes in the visualized lower lumbar spine. SI joints symmetric. No acute bony abnormality. Specifically, no fracture, subluxation, or dislocation. IMPRESSION: No acute bony abnormality. Electronically Signed   By: Rolm Baptise M.D.   On: 10/08/2022 19:03    Review of Systems  All other systems reviewed and are negative.  Blood pressure (!) 140/63, pulse 74, temperature 97.9 F (36.6 C), temperature source Oral, resp. rate 14, height '4\' 11"'$  (1.499 m), weight 61.2 kg, SpO2 97 %. Physical Exam Vitals reviewed.  Constitutional:      Appearance: Normal appearance. She is normal weight.  HENT:     Head: Normocephalic and atraumatic.  Eyes:     Extraocular Movements: Extraocular movements intact.     Pupils: Pupils are equal, round, and reactive to light.  Cardiovascular:     Rate and Rhythm: Normal rate.     Pulses: Normal pulses.  Pulmonary:     Effort: Pulmonary effort is normal.     Breath sounds: Normal breath  sounds.  Abdominal:     Palpations: Abdomen is soft.  Musculoskeletal:     Cervical back: Normal range of motion and neck supple.     Right hip: Tenderness and bony tenderness present. Decreased range of motion. Decreased strength.  Neurological:     Mental Status: She is alert  and oriented to person, place, and time.  Psychiatric:        Behavior: Behavior normal.     Assessment/Plan: Right hip with transcervical femoral neck fracture  I spoke to the patient in length in detail about treatment options.  One option would be cannulated screw fixation.  However the fracture is slightly displaced and I am concerned about the healing of this fracture and for her needing to be nonweightbearing for up to 6 weeks with cannulated screw fixation.  The bone is slightly osteopenic.  I feel that a better option would be a total hip arthroplasty given the patient's high level of function and mobility.  I explained the rationale behind this as well as the risk and benefits of all different options.  She does wish to proceed with surgery today and we have recommended this as well.  She is on the schedule for later today.  Mcarthur Rossetti 10/09/2022, 9:12 AM

## 2022-10-09 NOTE — Anesthesia Preprocedure Evaluation (Addendum)
Anesthesia Evaluation  Patient identified by MRN, date of birth, ID band Patient awake    Reviewed: Allergy & Precautions, NPO status , Patient's Chart, lab work & pertinent test results  Airway Mallampati: II  TM Distance: >3 FB     Dental  (+) Teeth Intact, Dental Advidsory Given, Caps   Pulmonary neg pulmonary ROS   breath sounds clear to auscultation       Cardiovascular hypertension, Pt. on medications  Rhythm:regular Rate:Normal     Neuro/Psych  PSYCHIATRIC DISORDERS  Depression    CVA    GI/Hepatic Neg liver ROS,GERD  Medicated,,  Endo/Other  negative endocrine ROS    Renal/GU negative Renal ROS     Musculoskeletal  (+) Arthritis ,    Abdominal   Peds  Hematology negative hematology ROS (+)   Anesthesia Other Findings   Reproductive/Obstetrics                              Anesthesia Physical Anesthesia Plan  ASA: 3  Anesthesia Plan: General   Post-op Pain Management: Minimal or no pain anticipated   Induction: Intravenous  PONV Risk Score and Plan: Dexamethasone and Ondansetron  Airway Management Planned: Oral ETT  Additional Equipment: None  Intra-op Plan:   Post-operative Plan: Extubation in OR  Informed Consent: I have reviewed the patients History and Physical, chart, labs and discussed the procedure including the risks, benefits and alternatives for the proposed anesthesia with the patient or authorized representative who has indicated his/her understanding and acceptance.     Dental advisory given and Dental Advisory Given  Plan Discussed with: CRNA, Anesthesiologist and Surgeon  Anesthesia Plan Comments:          Anesthesia Quick Evaluation

## 2022-10-09 NOTE — Progress Notes (Addendum)
Initial Nutrition Assessment  DOCUMENTATION CODES:   Not applicable  INTERVENTION:  Recommend advancing to regular diet once diet able to be advanced after surgery.   Encouraged adequate intake of protein at meals and snacks.  Provide Ensure Enlive po BID, each supplement provides 350 kcal and 20 grams of protein. Patient prefers chocolate.  Provide multivitamin daily.  NUTRITION DIAGNOSIS:   Increased nutrient needs related to hip fracture, post-op healing as evidenced by estimated needs.  GOAL:   Patient will meet greater than or equal to 90% of their needs  MONITOR:   PO intake, Diet advancement, Supplement acceptance, Labs, Weight trends, Skin, I & O's  REASON FOR ASSESSMENT:   Consult Hip fracture protocol  ASSESSMENT:   81 year old female with PMHx of HTN, CVA in 2012, depression, GERD, arthritis, laparoscopic cholecystectomy 08/28/2015, hx hiatal hernia repair who is admitted after mechanical fall found to have acute displaced and impacted right femoral neck fracture.  Plan for operative repair today.  Met with patient at bedside. She reports her appetite is good at baseline. Her eating pattern has changed since she lost her husband in February of 2022 as she lives alone now. She typically eats 2 meals per day plus 1-2 snacks. For her first meal she has cereal with milk. For her second meal she has a sandwich or boiled eggs or macaroni and cheese. For snacks she has Cheetos or Fritos. Denies food allergies or intolerances. Denies difficulty with chewing or swallowing. Reports having nausea yesterday related to pain and medications but this is now improved. She has chronic abdominal pain at baseline and alternates between constipation and diarrhea. Discussed increased nutrient needs for healing post-operatively. Encouraged adequate intake of protein once diet is able to be advanced. Patient is amenable to drinking Ensure Enlive to help meet calorie/protein needs once diet  is advanced.   Pt endorses unintentional wt loss that started when she lost her husband in February of 2022. Her UBW was 140 lbs. Noted wt of 67.1 kg on 06/10/2021. Pt is currently 61.2 kg. She has lost 5.9 kg or 8.8% weight from 06/10/21 to 10/08/22, which is not significant for time frame.  UOP: none documented  I/O: +344.5 mL since admission  Medications reviewed and none pertinent  Labs reviewed: Creatinine 1.04  Patient does not meet criteria for malnutrition at this time.   NUTRITION - FOCUSED PHYSICAL EXAM:  Flowsheet Row Most Recent Value  Orbital Region No depletion  Upper Arm Region Moderate depletion  Thoracic and Lumbar Region No depletion  Buccal Region No depletion  Temple Region No depletion  Clavicle Bone Region No depletion  Clavicle and Acromion Bone Region Mild depletion  Scapular Bone Region No depletion  Dorsal Hand Mild depletion  Patellar Region No depletion  [assessed left leg]  Anterior Thigh Region No depletion  [assessed left leg]  Posterior Calf Region No depletion  [assessed left leg]  Edema (RD Assessment) None  Hair Reviewed  Eyes Reviewed  Mouth Reviewed  Skin Reviewed  Nails Reviewed       Diet Order:   Diet Order             Diet NPO time specified Except for: Sips with Meds  Diet effective 0500                  EDUCATION NEEDS:   Education needs have been addressed (Discussed increased needs for calories and protein for post-operative healing. Encouraged adequate intake of protein.)  Skin:  Skin Assessment: Reviewed RN Assessment  Last BM:  10/08/22  Height:   Ht Readings from Last 1 Encounters:  10/08/22 _0  (1.499 m)   Weight:   Wt Readings from Last 1 Encounters:  10/08/22 61.2 kg   BMI:  Body mass index is 27.27 kg/m.  Estimated Nutritional Needs:   Kcal:  1600-1800  Protein:  80-90 grams  Fluid:  1.6-1.8 L/day  Loanne Drilling, MS, RD, LDN, CNSC Pager number available on Amion

## 2022-10-09 NOTE — Op Note (Signed)
Operative Note  Date of operation: 10/09/2022 Preoperative diagnosis: Displaced right hip femoral neck fracture (trans cervical ( Postoperative diagnosis: Same  Procedure: Right direct anterior total hip arthroplasty  Implants: DePuy sector GRIPTION acetabular opponent size 50, 32+0 polythene liner, size 3 Actis femoral component with standard offset, 32+1 metal head ball  Surgeon: Lind Guest. Ninfa Linden, MD Assistant: Benita Stabile, PA-C  Anesthesia: General Antibiotics: 1 g IV vancomycin EBL: 629 cc Complications: None  Indications: The patient is an 81 year old female who sustained an unfortunate axial mechanical fall yesterday injuring her right hip.  She had inability to ambulate and was found to have a right hip femoral neck fracture.  Due to her osteopenic bone and displaced nature of the fracture I recommended a total hip arthroplasty versus cannulated screw fixation.  She has had a lot of falls over this last year and I do not feel that she would do well with nonweightbearing and 6 weeks of nonweightbearing trying to get cannulated screws to heal.  I felt it was in her best interest to have a total hip arthroplasty.  I described this in detail including a thorough discussion of risk and benefits of surgery.  Procedure description: After informed consent was obtained and appropriate right hip was marked, the patient brought the operating room and general anesthesia was obtained while she was on her stretcher.  She was then placed supine on the Hana fracture table with a perineal post in place in both legs and inline skeletal traction boots with no traction applied.  Her right upper hip was assessed radiographically with fluoroscopy and found to have a displaced fracture.  The right hip was then prepped and draped with DuraPrep and sterile drapes.  Timeout was called and she identified as correct patient the correct right hip.  An incision was made just inferior and posterior to the ASIS  and carried slightly obliquely down the leg.  Dissection was carried down the tensor fascia lata muscle and the tensor fascia was then divided longitudinally to proceed with direct and approach the hip.  Circumflex vessels were identified and cauterized.  The hip capsule was identified and opened up and found a hemarthrosis consistent with a displaced femoral neck fracture.  Cobra tractors were placed around the medial and lateral femoral neck and a refreshing femoral neck cut was made just distal to the fracture but proximal to the lesser trochanter.  This was made with oscillating saw and completed with an osteotome.  A corkscrew guide was placed in the femoral head and the femoral head was removed.  A bent Hohmann was then placed over the medial acetabular rim and remnants of the ostial labrum and other debris removed.  We then began reaming under direct visitation from a size 43 reamer and stepwise increments going up to a size 49 reamer with all reamers placed in direct visitation and the last reamer was placed on direct fluoroscopy in order to obtain a depth of reaming, inclination and anteversion.  The real DePuy sector GRIPTION acetabular component size 50 was then placed without difficulty and a 32+0 polythene liner was placed within that acetabular component.  Attention was then turned to the femur.  With the leg externally rotated to 120 degrees, extended and brought underneath the other leg, a Mueller retractor was placed medially and a Hohmann retractor was placed behind the greater trochanter.  A box cutting osteotome was used to enter the femoral canal and then broaching was initiated from a size 0 broach going  up to a size 3.  With a size 3 in place we trialed a standard offset femoral neck and a 32+1 metal hip ball.  The leg was brought back over and up and with traction and internal rotation reduced in the pelvis.  We assessed it radiographically and clinically and we are pleased with the stability  of the hip as well as placement of the implants.  We dislocated the hip and with the trial components.  We placed the real Actis femoral component with standard offset size 3 and the real 32+5 metal head ball and again reduce this in the acetabulum was stable.  We then assessed again radiographically mechanically.  The soft tissue was irrigated normal saline solution.  Remnants of the joint capsule were closed with interrupted #1 Ethibond suture followed by #1 Vicryl close the tensor fascia and 0 Vicryl to close the deep tissue with 2-0 Vicryl subcutaneous tissue.  The skin was closed with staples.  An Aquacel dressing was applied.  She was taken off the Hana table, awakened, extubated taken recovery stable addition with all final counts being correct and no complications noted.  Benita Stabile, PA-C did assisted in entire case from beginning to end and his assistance was medically necessary for helping guide implant placement and retracting soft tissues as well as a layered closure of the wound.

## 2022-10-09 NOTE — Anesthesia Procedure Notes (Signed)
Procedure Name: Intubation Date/Time: 10/09/2022 11:43 AM  Performed by: Barrington Ellison, CRNAPre-anesthesia Checklist: Patient identified, Emergency Drugs available, Suction available and Patient being monitored Patient Re-evaluated:Patient Re-evaluated prior to induction Oxygen Delivery Method: Circle System Utilized Preoxygenation: Pre-oxygenation with 100% oxygen Induction Type: IV induction Ventilation: Mask ventilation without difficulty Laryngoscope Size: Glidescope and 4 Grade View: Grade I Tube type: Oral Tube size: 6.5 mm Number of attempts: 3 Airway Equipment and Method: Stylet and Oral airway Placement Confirmation: ETT inserted through vocal cords under direct vision, positive ETCO2 and breath sounds checked- equal and bilateral Secured at: 22 cm Tube secured with: Tape Dental Injury: Teeth and Oropharynx as per pre-operative assessment  Difficulty Due To: Difficulty was unanticipated and Difficult Airway- due to reduced neck mobility Future Recommendations: Recommend- induction with short-acting agent, and alternative techniques readily available Comments: DL x 2 with Mac 3, Grade 2 view, unable to pass 7.0 ETT into trachea, DL x 1 with Glidescope 6.5 ETT, smaller ETT recommended

## 2022-10-09 NOTE — Plan of Care (Signed)
  Problem: Education: Goal: Knowledge of General Education information will improve Description: Including pain rating scale, medication(s)/side effects and non-pharmacologic comfort measures Outcome: Progressing   Problem: Clinical Measurements: Goal: Ability to maintain clinical measurements within normal limits will improve Outcome: Progressing   

## 2022-10-09 NOTE — Plan of Care (Signed)

## 2022-10-09 NOTE — Transfer of Care (Signed)
Immediate Anesthesia Transfer of Care Note  Patient: [name] [facility]  Procedure(s) Performed: TOTAL HIP ARTHROPLASTY ANTERIOR APPROACH (Right: Hip)  Patient Location: PACU  Anesthesia Type:General  Level of Consciousness: drowsy  Airway & Oxygen Therapy: Patient Spontanous Breathing and Patient connected to face mask oxygen  Post-op Assessment: Report given to RN, Post -op Vital signs reviewed and stable, and Patient moving all extremities X 4  Post vital signs: Reviewed and stable  Last Vitals:  Vitals Value Taken Time  BP 145/89 10/09/22 1319  Temp    Pulse 83 10/09/22 1321  Resp 17 10/09/22 1321  SpO2 94 % 10/09/22 1321  Vitals shown include unvalidated device data.  Last Pain:  Vitals:   10/09/22 1055  TempSrc: Oral  PainSc: 2          Complications:  Encounter Notable Events  Notable Event Outcome Phase Comment  Difficult to intubate - unexpected  Intraprocedure Filed from anesthesia note documentation.

## 2022-10-09 NOTE — H&P (Signed)
History and Physical    [name] [name] BSJ:[id number] DOB: 11-11-1941 DOA: 10/08/2022  PCP: [facility] [place]  Patient coming from: Home  Chief Complaint: Fall  HPI: [name] is a 81 y.o. female with medical history significant of CVA, depression, hypertension, GERD, arthritis presented to the ED via EMS complaining of right hip pain after a mechanical fall at home.  Hemodynamically stable.  Labs showing WBC 13.1, potassium 3.3, bicarb 21, glucose 121, creatinine 1.2.  Chest x-ray showing hiatal hernia and no acute finding.  CT showing acute displaced and impacted right femoral neck fracture. Patient was given morphine, Zofran, and IV fluids.  Orthopedics (Dr. Ninfa Linden) consulted and planning on taking the patient to the OR for a total hip arthroplasty 11/24 sometime in the afternoon.  [facility] called to admit.  Patient states she was going into her garage when she missed a step and fell onto concrete.  She is endorsing pain in her right hip since after the fall and was not able to get up from the floor.  Reports scraping her face against a cardboard box when she felt but denies any injury to her head or loss of consciousness.  No facial pain or bruising.  Denies fevers, cough, shortness of breath, chest pain, vomiting, abdominal pain, diarrhea, or any urinary symptoms.  Per daughter, patient is currently undergoing physical therapy due to problems with ambulation.  Review of Systems:  Review of Systems  All other systems reviewed and are negative.   Past Medical History:  Diagnosis Date   Arthritis    CVA (cerebral vascular accident) ([place])    Depression    GERD (gastroesophageal reflux disease)    Hypertension     Past Surgical History:  Procedure Laterality Date   CHOLECYSTECTOMY N/A 08/28/2015   Procedure: LAPAROSCOPIC CHOLECYSTECTOMY;  Surgeon: Fanny Skates, MD;  Location: [facility];  Service: General;  Laterality: N/A;   HIATAL HERNIA REPAIR      WISDOM TOOTH EXTRACTION       reports that she has never smoked. She has never used smokeless tobacco. She reports that she does not drink alcohol and does not use drugs.  Allergies  Allergen Reactions   Ambien [Zolpidem Tartrate] Other (See Comments)    Very dizzy   Crestor  [Rosuvastatin Calcium] Other (See Comments)    Myalgia   Lisinopril Cough   Sulfa Antibiotics Nausea And Vomiting   Penicillins Rash    Has patient had a PCN reaction causing immediate rash, facial/tongue/throat swelling, SOB or lightheadedness with hypotension: Yes Has patient had a PCN reaction causing severe rash involving mucus membranes or skin necrosis: No Has patient had a PCN reaction that required hospitalization No Has patient had a PCN reaction occurring within the last 10 years: No If all of the above answers are "NO", then may proceed with Cephalosporin    Family History  Problem Relation Age of Onset   Breast cancer Neg Hx     Prior to Admission medications   Medication Sig Start Date End Date Taking? Authorizing Provider  amLODipine-olmesartan (AZOR) 10-40 MG per tablet Take 0.25 tablets by mouth daily.     [provider]  aspirin 325 MG tablet Take 650 mg by mouth daily. Take on Monday, Wednesday, and Friday    [provider]  Biotin 5000 MCG CAPS Take 5,000 mcg by mouth daily.    [provider]  Cyanocobalamin (VITAMIN B-12 PO) Take 1 tablet by mouth daily.  [provider]  FLUoxetine (PROZAC) 40 MG capsule Take 40 mg by mouth daily.    [provider]  HYDROcodone-acetaminophen (NORCO) 5-325 MG tablet Take 1-2 tablets by mouth every 6 (six) hours as needed for moderate pain or severe pain. 08/28/15   Fanny Skates, MD  meclizine (ANTIVERT) 25 MG tablet Take 1 or 2 po Q 6hrs for dizziness Patient taking differently: Take 25-50 mg by mouth every 6 (six) hours as needed for dizziness. Take 1 or 2 po Q 6hrs for dizziness 04/04/13   Rolland Porter,  MD  Multiple Vitamins-Minerals (ICAPS AREDS 2 PO) Take 1 tablet by mouth daily.    [provider]  omeprazole (PRILOSEC) 20 MG capsule Take 20 mg by mouth daily. Tuesday, Thursday, and Saturday    [provider]  ondansetron (ZOFRAN ODT) 4 MG disintegrating tablet Take 1 tablet (4 mg total) by mouth every 8 (eight) hours as needed for nausea (or dizziness). 04/04/13   Rolland Porter, MD  oxyCODONE (ROXICODONE) 5 MG immediate release tablet Take 0.5 tablets (2.5 mg total) by mouth at bedtime as needed for up to 8 doses for severe pain. 06/10/21   Arnaldo Natal, MD  topiramate (TOPAMAX) 25 MG tablet TAKE 1 TABLET BY MOUTH DAILY FOR 1 WEEK THEN TAKE 1 TABLET TWICE DAILY 05/31/13   Garvin Fila, MD  VITAMIN D, CHOLECALCIFEROL, PO Take 1 tablet by mouth daily.    [provider]    Physical Exam: Vitals:   10/08/22 2243 10/08/22 2300 10/08/22 2330 10/09/22 0000  BP:  (!) 151/64 (!) 146/68 139/70  Pulse:  82 86 85  Resp:  '13 11 18  '$ Temp: 98.4 F (36.9 C)     TempSrc: Oral     SpO2:  93% 96% 96%  Weight:      Height:        Physical Exam Vitals reviewed.  Constitutional:      General: She is not in acute distress. HENT:     Head: Normocephalic and atraumatic.  Eyes:     Extraocular Movements: Extraocular movements intact.  Cardiovascular:     Rate and Rhythm: Normal rate and regular rhythm.     Pulses: Normal pulses.  Pulmonary:     Effort: Pulmonary effort is normal. No respiratory distress.     Breath sounds: Normal breath sounds. No wheezing or rales.  Abdominal:     General: Bowel sounds are normal. There is no distension.     Palpations: Abdomen is soft.     Tenderness: There is no abdominal tenderness.  Musculoskeletal:     Cervical back: Normal range of motion. No tenderness.     Right lower leg: No edema.     Left lower leg: No edema.     Comments: Right lower extremity shortened and externally rotated.  Dorsalis pedis pulse intact.  Skin:     General: Skin is warm and dry.  Neurological:     General: No focal deficit present.     Mental Status: She is alert and oriented to person, place, and time.     Labs on Admission: I have personally reviewed following labs and imaging studies  CBC: Recent Labs  Lab 10/08/22 2228  WBC 13.1*  NEUTROABS 11.0*  HGB 13.7  HCT 39.6  MCV 88.0  PLT 962   Basic Metabolic Panel: Recent Labs  Lab 10/08/22 2228  NA 141  K 3.3*  CL 107  CO2 21*  GLUCOSE 121*  BUN 18  CREATININE 1.24*  CALCIUM 9.9   GFR: Estimated Creatinine Clearance: 28.3 mL/min (A) (by C-G formula based on SCr of 1.24 mg/dL (H)). Liver Function Tests: No results for input(s): "AST", "ALT", "ALKPHOS", "BILITOT", "PROT", "ALBUMIN" in the last 168 hours. No results for input(s): "LIPASE", "AMYLASE" in the last 168 hours. No results for input(s): "AMMONIA" in the last 168 hours. Coagulation Profile: No results for input(s): "INR", "PROTIME" in the last 168 hours. Cardiac Enzymes: No results for input(s): "CKTOTAL", "CKMB", "CKMBINDEX", "TROPONINI" in the last 168 hours. BNP (last 3 results) No results for input(s): "PROBNP" in the last 8760 hours. HbA1C: No results for input(s): "HGBA1C" in the last 72 hours. CBG: No results for input(s): "GLUCAP" in the last 168 hours. Lipid Profile: No results for input(s): "CHOL", "HDL", "LDLCALC", "TRIG", "CHOLHDL", "LDLDIRECT" in the last 72 hours. Thyroid Function Tests: No results for input(s): "TSH", "T4TOTAL", "FREET4", "T3FREE", "THYROIDAB" in the last 72 hours. Anemia Panel: No results for input(s): "VITAMINB12", "FOLATE", "FERRITIN", "TIBC", "IRON", "RETICCTPCT" in the last 72 hours. Urine analysis:    Component Value Date/Time   COLORURINE YELLOW 02/12/2009 0912   APPEARANCEUR CLOUDY (A) 02/12/2009 0912   LABSPEC 1.017 02/12/2009 0912   PHURINE 7.5 02/12/2009 0912   GLUCOSEU NEGATIVE 02/12/2009 0912   HGBUR NEGATIVE 02/12/2009 0912   BILIRUBINUR NEGATIVE  02/12/2009 0912   KETONESUR NEGATIVE 02/12/2009 0912   PROTEINUR NEGATIVE 02/12/2009 0912   UROBILINOGEN 0.2 02/12/2009 0912   NITRITE NEGATIVE 02/12/2009 0912   LEUKOCYTESUR MODERATE (A) 02/12/2009 0912    Radiological Exams on Admission: CT Hip Right Wo Contrast  Result Date: 10/08/2022 CLINICAL DATA:  Hip trauma, fracture suspected, no prior imaging EXAM: CT OF THE RIGHT HIP WITHOUT CONTRAST TECHNIQUE: Multidetector CT imaging of the right hip was performed according to the standard protocol. Multiplanar CT image reconstructions were also generated. RADIATION DOSE REDUCTION: This exam was performed according to the departmental dose-optimization program which includes automated exposure control, adjustment of the mA and/or kV according to patient size and/or use of iterative reconstruction technique. COMPARISON:  X-ray right hip 10/08/2022 FINDINGS: Bones/Joint/Cartilage Acute displaced and impacted right femoral neck fracture. No other acute fracture of the right hip identified. No right hip dislocation. Ligaments Suboptimally assessed by CT. Muscles and Tendons Grossly unremarkable. Soft tissues No large hematoma formation. IMPRESSION: Acute displaced and impacted right femoral neck fracture. Electronically Signed   By: Iven Finn M.D.   On: 10/08/2022 22:02   DG Chest 2 View  Result Date: 10/08/2022 CLINICAL DATA:  Shortness of breath. Fall at home. EXAM: CHEST - 2 VIEW COMPARISON:  Remote CT 09/25/2009 esophagram 06/26/2015 FINDINGS: Normal heart size. There is a retrocardiac hiatal hernia. The lungs are clear. Pulmonary vasculature is normal. No consolidation, pleural effusion, or pneumothorax. No acute osseous abnormalities are seen. IMPRESSION: 1. No acute chest findings. 2. Hiatal hernia. Electronically Signed   By: Keith Rake M.D.   On: 10/08/2022 19:30   DG Hip Unilat  With Pelvis 2-3 Views Right  Result Date: 10/08/2022 CLINICAL DATA:  Fall EXAM: DG HIP (WITH OR WITHOUT  PELVIS) 2-3V RIGHT COMPARISON:  None Available. FINDINGS: Degenerative changes in the hips bilaterally with joint space narrowing and spurring. Degenerative changes in the visualized lower lumbar spine. SI joints symmetric. No acute bony abnormality. Specifically, no fracture, subluxation, or dislocation. IMPRESSION: No acute bony abnormality. Electronically Signed   By: Rolm Baptise M.D.   On: 10/08/2022 19:03    EKG: Independently reviewed.  Sinus rhythm, QTc 533.  Borderline T wave abnormality in lateral leads new since prior tracing from 2016.  Assessment and Plan  Acute displaced and impacted right femoral neck fracture Secondary to a mechanical fall. -Orthopedics consulted and planning on taking the patient to the OR for a total hip arthroplasty 11/24 sometime in the afternoon. -Nonweightbearing -Pain management -Keep n.p.o. starting early morning  Mild leukocytosis Likely reactive.  Patient is afebrile.  Chest x-ray not suggestive of pneumonia. -UA -Repeat CBC in a.m.  Mild hypokalemia QT prolongation Potassium 3.3, QTc 533. -Cardiac monitoring -Replace potassium and continue to monitor -Check magnesium level and replace if low -Avoid QT prolonging drugs if possible -Repeat EKG in a.m.  Preoperative risk stratification -Based on revised cardiac index calculator, patient is class II risk with 6% 30-day risk of death, MI, or cardiac arrest. -EKG showing borderline T wave abnormality in lateral leads, new since prior tracing from 2016.  Patient is not endorsing any anginal symptoms.  Check troponin.   History of CVA -Hold aspirin at this time  Hypertension Systolic currently in the low 140s. -Per pharmacy med rec, patient is not taking any antihypertensives.  Continue pain management and monitor blood pressure closely.  Depression -Continue Prozac  DVT prophylaxis: SCDs Code Status: Full Code (discussed with the patient) Family Communication: Daughter at  bedside. Consults called: Orthopedics Level of care: Telemetry bed Admission status: It is my clinical opinion that admission to INPATIENT is reasonable and necessary because of the expectation that this patient will require hospital care that crosses at least 2 midnights to treat this condition based on the medical complexity of the problems presented.  Given the aforementioned information, the predictability of an adverse outcome is felt to be significant.   Shela Leff MD [facility]  If 7PM-7AM, please contact night-coverage [email or web address]  10/09/2022, 1:09 AM

## 2022-10-10 ENCOUNTER — Encounter (HOSPITAL_COMMUNITY): Payer: Self-pay | Admitting: Orthopaedic Surgery

## 2022-10-10 DIAGNOSIS — D72829 Elevated white blood cell count, unspecified: Secondary | ICD-10-CM

## 2022-10-10 DIAGNOSIS — E876 Hypokalemia: Secondary | ICD-10-CM

## 2022-10-10 DIAGNOSIS — S72009D Fracture of unspecified part of neck of unspecified femur, subsequent encounter for closed fracture with routine healing: Secondary | ICD-10-CM

## 2022-10-10 DIAGNOSIS — I10 Essential (primary) hypertension: Secondary | ICD-10-CM

## 2022-10-10 LAB — CBC
HCT: 31.7 % — ABNORMAL LOW (ref 36.0–46.0)
Hemoglobin: 10.7 g/dL — ABNORMAL LOW (ref 12.0–15.0)
MCH: 30.2 pg (ref 26.0–34.0)
MCHC: 33.8 g/dL (ref 30.0–36.0)
MCV: 89.5 fL (ref 80.0–100.0)
Platelets: 205 10*3/uL (ref 150–400)
RBC: 3.54 MIL/uL — ABNORMAL LOW (ref 3.87–5.11)
RDW: 12.5 % (ref 11.5–15.5)
WBC: 13.7 10*3/uL — ABNORMAL HIGH (ref 4.0–10.5)
nRBC: 0 % (ref 0.0–0.2)

## 2022-10-10 LAB — COMPREHENSIVE METABOLIC PANEL
ALT: 22 U/L (ref 0–44)
AST: 27 U/L (ref 15–41)
Albumin: 2.9 g/dL — ABNORMAL LOW (ref 3.5–5.0)
Alkaline Phosphatase: 76 U/L (ref 38–126)
Anion gap: 9 (ref 5–15)
BUN: 11 mg/dL (ref 8–23)
CO2: 25 mmol/L (ref 22–32)
Calcium: 9.5 mg/dL (ref 8.9–10.3)
Chloride: 106 mmol/L (ref 98–111)
Creatinine, Ser: 0.95 mg/dL (ref 0.44–1.00)
GFR, Estimated: >60 mL/min (ref >60)
Glucose, Bld: 116 mg/dL — ABNORMAL HIGH (ref 70–99)
Potassium: 4.1 mmol/L (ref 3.5–5.1)
Sodium: 140 mmol/L (ref 135–145)
Total Bilirubin: 0.6 mg/dL (ref 0.3–1.2)
Total Protein: 5.6 g/dL — ABNORMAL LOW (ref 6.5–8.1)

## 2022-10-10 LAB — MAGNESIUM: Magnesium: 1.7 mg/dL (ref 1.7–2.4)

## 2022-10-10 NOTE — Progress Notes (Signed)
Subjective: 1 Day Post-Op Procedure(s) (LRB): TOTAL HIP ARTHROPLASTY ANTERIOR APPROACH (Right) Patient reports pain as moderate.  Tolerated surgery well yesterday.  Objective: Vital signs in last 24 hours: Temp:  [97.4 F (36.3 C)-97.9 F (36.6 C)] 97.5 F (36.4 C) (11/24 2011) Pulse Rate:  [73-77] 77 (11/24 1421) Resp:  [12-19] 15 (11/24 2011) BP: (139-163)/(69-89) 147/70 (11/25 0000) SpO2:  [93 %-99 %] 93 % (11/24 1421)  Intake/Output from previous day: 11/24 0701 - 11/25 0700 In: 2083.3 [P.O.:240; I.V.:1593.3; IV Piggyback:250] Out: 200 [Blood:200] Intake/Output this shift: No intake/output data recorded.  Recent Labs    10/08/22 2228 10/09/22 0709 10/10/22 0307  HGB 13.7 11.8* 10.7*   Recent Labs    10/09/22 0709 10/10/22 0307  WBC 9.1 13.7*  RBC 3.95 3.54*  HCT 34.9* 31.7*  PLT 226 205   Recent Labs    10/09/22 0709 10/10/22 0307  NA 142 140  K 3.9 4.1  CL 109 106  CO2 23 25  BUN 13 11  CREATININE 1.04* 0.95  GLUCOSE 96 116*  CALCIUM 9.3 9.5   No results for input(s): "LABPT", "INR" in the last 72 hours.  Sensation intact distally Intact pulses distally Dorsiflexion/Plantar flexion intact Incision: dressing C/D/I   Assessment/Plan: 1 Day Post-Op Procedure(s) (LRB): TOTAL HIP ARTHROPLASTY ANTERIOR APPROACH (Right) Up with therapy      [name] 10/10/2022, 8:32 AM

## 2022-10-10 NOTE — Discharge Instructions (Signed)

## 2022-10-10 NOTE — TOC Initial Note (Signed)
Transition of Care [facility]) - Initial/Assessment Note    Patient Details  Name: [name] MRN: [id number] Date of Birth: [birth year]  Transition of Care [facility]) CM/SW Contact:    Ina Homes, [place] Phone Number: 10/10/2022, 2:39 PM  Clinical Narrative:                  SW met with pt and daughter at bedside. SW discussed PT recs for SNF. Pt/daughter agreeable. SW provided list of SNF's in pt area. Pt/daughter will review and discuss with remainder of family for preference.    Expected Discharge Plan: Skilled Nursing Facility Barriers to Discharge: Continued Medical Work up, SNF Pending bed offer   Patient Goals and CMS Choice Patient states their goals for this hospitalization and ongoing recovery are:: return home CMS Medicare.gov Compare Post Acute Care list provided to:: Patient Choice offered to / list presented to : Patient  Expected Discharge Plan and Services Expected Discharge Plan: [place] Choice: [place] arrangements for the past 2 months: Single Family Home                                      Prior Living Arrangements/Services Living arrangements for the past 2 months: Single Family Home Lives with:: Self Patient language and need for interpreter reviewed:: Yes Do you feel safe going back to the place where you live?: Yes      Need for Family Participation in Patient Care: Yes (Comment) Care giver support system in place?: Yes (comment)   Criminal Activity/Legal Involvement Pertinent to Current Situation/Hospitalization: No - Comment as needed  Activities of Daily Living Home Assistive Devices/Equipment: None ADL Screening (condition at time of admission) Patient's cognitive ability adequate to safely complete daily activities?: Yes Is the patient deaf or have difficulty hearing?: No Does the patient have difficulty seeing, even when wearing glasses/contacts?: No Does the  patient have difficulty concentrating, remembering, or making decisions?: No Patient able to express need for assistance with ADLs?: Yes Does the patient have difficulty dressing or bathing?: No Independently performs ADLs?: Yes (appropriate for developmental age) Does the patient have difficulty walking or climbing stairs?: Yes Weakness of Legs: Right Weakness of Arms/Hands: None  Permission Sought/Granted Permission sought to share information with : Facility Art therapist granted to share information with : Yes, Verbal Permission Granted  Share Information with NAME: Raquel Sarna  Permission granted to share info w AGENCY: SNF  Permission granted to share info w Relationship: daughter     Emotional Assessment Appearance:: Appears stated age Attitude/Demeanor/Rapport: Engaged Affect (typically observed): Accepting Orientation: : Oriented to Self, Oriented to Place, Oriented to  Time, Oriented to Situation   Psych Involvement: No (comment)  Admission diagnosis:  Hip fracture ([place]) [S72.009A] Closed fracture of right hip, initial encounter [facility]) [S72.001A] Patient Active Problem List   Diagnosis Date Noted   Hip fracture ([place]) 10/09/2022   Leukocytosis 10/09/2022   Hypokalemia 10/09/2022   QT prolongation 10/09/2022   History of stroke 10/09/2022   Essential hypertension 10/09/2022   Depression 10/09/2022   Basicervical fracture of neck of right femur ([place]) 10/09/2022   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

## 2022-10-10 NOTE — TOC CAGE-AID Note (Signed)
Transition of Care [facility]) - CAGE-AID Screening  Patient Details  Name: [name] MRN: [id number] Date of Birth: 1941-06-19  Clinical Narrative:  Patient admitted after a fall at home. Patient denies any clcohol or illicit drug use, no need for substance abuse resources at this time.  CAGE-AID Screening:    Have You Ever Felt You Ought to Cut Down on Your Drinking or Drug Use?: No Have People Annoyed You By Critizing Your Drinking Or Drug Use?: No Have You Felt Bad Or Guilty About Your Drinking Or Drug Use?: No Have You Ever Had a Drink or Used Drugs First Thing In The Morning to Steady Your Nerves or to Get Rid of a Hangover?: No CAGE-AID Score: 0  Substance Abuse Education Offered: No

## 2022-10-10 NOTE — NC FL2 (Signed)
[place] LEVEL OF CARE SCREENING TOOL     IDENTIFICATION  Patient Name: [name] [facility] Birthdate: 12/29/1940 Sex: female Admission Date (Current Location): 10/08/2022  [facility] and [place] Number:  Herbalist and Address:  [facility] [place], [place] [address], [place], [place] [address]      Provider Number: [id number]  Attending Physician Name and Address:  Caren Griffins, MD  Relative Name and Phone Number:       Current Level of Care: Hospital Recommended Level of Care: [place] Prior Approval Number:    Date Approved/Denied:   PASRR Number: [id number]  Discharge Plan: Hospital    Current Diagnoses: Patient Active Problem List   Diagnosis Date Noted   Hip fracture ([place]) 10/09/2022   Leukocytosis 10/09/2022   Hypokalemia 10/09/2022   QT prolongation 10/09/2022   History of stroke 10/09/2022   Essential hypertension 10/09/2022   Depression 10/09/2022   Basicervical fracture of neck of right femur ([place]) 10/09/2022   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 08/28/2015    Orientation RESPIRATION BLADDER Height & Weight     Self, Time, Situation, Place  Normal Continent Weight: 135 lb (61.2 kg) Height:  '4\' 11"'$  (149.9 cm)  BEHAVIORAL SYMPTOMS/MOOD NEUROLOGICAL BOWEL NUTRITION STATUS      Continent Diet (see discharge summary)  AMBULATORY STATUS COMMUNICATION OF NEEDS Skin   Limited Assist Verbally Other (Comment) (right hip incision)                       Personal Care Assistance Level of Assistance  Bathing, Dressing Bathing Assistance: Limited assistance   Dressing Assistance: Limited assistance     Functional Limitations Info  Sight Sight Info: Impaired        SPECIAL CARE FACTORS FREQUENCY  PT (By licensed PT), OT (By licensed OT)     PT Frequency: 4-5x/wk OT Frequency: 4-5x/wk            Contractures      Additional Factors Info  Code  Status, Allergies Code Status Info: FULL Allergies Info: Ambien (zolpidem Tartrate) ;Crestor  (rosuvastatin Calcium) ;Myalgia  Lisinopril; Sulfa Antibiotics;Penicillins           Current Medications (10/10/2022):  This is the current hospital active medication list Current Facility-Administered Medications  Medication Dose Route Frequency Provider Last Rate Last Admin   0.9 %  sodium chloride infusion   Intravenous Continuous Mcarthur Rossetti, MD 75 mL/hr at 10/09/22 1506 New Bag at 10/09/22 1506   acetaminophen (TYLENOL) tablet 325-650 mg  325-650 mg Oral Q6H PRN Mcarthur Rossetti, MD       alum & mag hydroxide-simeth (MAALOX/MYLANTA) 200-200-20 MG/5ML suspension 30 mL  30 mL Oral Q4H PRN Mcarthur Rossetti, MD       aspirin chewable tablet 81 mg  81 mg Oral BID Mcarthur Rossetti, MD   81 mg at 10/10/22 0912   docusate sodium (COLACE) capsule 100 mg  100 mg Oral BID Mcarthur Rossetti, MD       feeding supplement (ENSURE ENLIVE / ENSURE PLUS) liquid 237 mL  237 mL Oral BID BM Mcarthur Rossetti, MD   237 mL at 10/10/22 1344   FLUoxetine (PROZAC) capsule 40 mg  40 mg Oral Daily Mcarthur Rossetti, MD   40 mg at 10/10/22 0912   HYDROcodone-acetaminophen (NORCO) 7.5-325 MG per tablet 1-2 tablet  1-2 tablet Oral Q4H PRN Mcarthur Rossetti, MD  HYDROcodone-acetaminophen (NORCO/VICODIN) 5-325 MG per tablet 1-2 tablet  1-2 tablet Oral Q6H PRN Mcarthur Rossetti, MD   2 tablet at 10/09/22 0141   HYDROcodone-acetaminophen (NORCO/VICODIN) 5-325 MG per tablet 1-2 tablet  1-2 tablet Oral Q4H PRN Mcarthur Rossetti, MD   2 tablet at 10/10/22 0920   menthol-cetylpyridinium (CEPACOL) lozenge 3 mg  1 lozenge Oral PRN Mcarthur Rossetti, MD   3 mg at 10/09/22 2008   Or   phenol (CHLORASEPTIC) mouth spray 1 spray  1 spray Mouth/Throat PRN Mcarthur Rossetti, MD       methocarbamol (ROBAXIN) tablet 500 mg  500 mg Oral Q6H PRN Mcarthur Rossetti, MD   500 mg at 10/09/22 2357   Or   methocarbamol (ROBAXIN) 500 mg in dextrose 5 % 50 mL IVPB  500 mg Intravenous Q6H PRN Mcarthur Rossetti, MD       methocarbamol (ROBAXIN) tablet 500 mg  500 mg Oral Q6H PRN Mcarthur Rossetti, MD       Or   methocarbamol (ROBAXIN) 500 mg in dextrose 5 % 50 mL IVPB  500 mg Intravenous Q6H PRN Mcarthur Rossetti, MD       metoCLOPramide (REGLAN) tablet 5-10 mg  5-10 mg Oral Q8H PRN Mcarthur Rossetti, MD       Or   metoCLOPramide (REGLAN) injection 5-10 mg  5-10 mg Intravenous Q8H PRN Mcarthur Rossetti, MD       morphine (PF) 2 MG/ML injection 0.5 mg  0.5 mg Intravenous Q2H PRN Mcarthur Rossetti, MD       morphine (PF) 2 MG/ML injection 0.5-1 mg  0.5-1 mg Intravenous Q2H PRN Mcarthur Rossetti, MD       multivitamin with minerals tablet 1 tablet  1 tablet Oral Daily Mcarthur Rossetti, MD   1 tablet at 10/10/22 0912   mupirocin ointment (BACTROBAN) 2 % 1 Application  1 Application Nasal BID Mcarthur Rossetti, MD   1 Application at 70/01/74 0910   naloxone [facility]) injection 0.4 mg  0.4 mg Intravenous PRN Mcarthur Rossetti, MD       pantoprazole (PROTONIX) EC tablet 40 mg  40 mg Oral Daily Mcarthur Rossetti, MD   40 mg at 10/10/22 0920     Discharge Medications: Please see discharge summary for a list of discharge medications.  Relevant Imaging Results:  Relevant Lab Results:   Additional Information  [id number]  Ina Homes, LCSWA

## 2022-10-10 NOTE — Plan of Care (Signed)
°  Problem: Education: °Goal: Knowledge of General Education information will improve °Description: Including pain rating scale, medication(s)/side effects and non-pharmacologic comfort measures °Outcome: Progressing °  °Problem: Health Behavior/Discharge Planning: °Goal: Ability to manage health-related needs will improve °Outcome: Progressing °  °Problem: Clinical Measurements: °Goal: Ability to maintain clinical measurements within normal limits will improve °Outcome: Progressing °  °Problem: Coping: °Goal: Level of anxiety will decrease °Outcome: Progressing °  °Problem: Pain Managment: °Goal: General experience of comfort will improve °Outcome: Progressing °  °Problem: Safety: °Goal: Ability to remain free from injury will improve °Outcome: Progressing °  °

## 2022-10-10 NOTE — Progress Notes (Signed)
PROGRESS NOTE  [name] [facility] IRS:[id number] DOB: 04-10-1941 DOA: 10/08/2022 PCP: [facility]   LOS: 1 day   Brief Narrative / Interim history: 81 year old female with prior CVA in 2012, HTN, depression, who comes into the hospital after ground-level fall.  She was at her daughter's house, just finished Thanksgiving dinner, and upon going down 2 steps into the garage she fell to the floor.  She felt right hip pain and inability to ambulate and was brought to the hospital.  She was found to have right hip fracture, orthopedic surgery was consulted   Subjective / 24h Interval events: Doing well this morning.  Has some pain but it is controlled.  Assesement and Plan: Principal Problem:   Basicervical fracture of neck of right femur (HCC) Active Problems:   Hip fracture (HCC)   Leukocytosis   Hypokalemia   QT prolongation   History of stroke   Essential hypertension   Depression   Principal problem Acute displaced and impacted right femoral neck fracture -Secondary to a mechanical fall.  Orthopedic surgery consulted, she is status post THA by Dr. Ninfa Linden on 11/24 -Mobilize with therapy today, suspect she may need SNF   Active problems Mild leukocytosis -likely reactive.  Monitor.  Afebrile   Mild hypokalemia -Potassium improved with repletion, normalized this morning   History of CVA -continue aspirin  Mild anemia of chronic disease-hemoglobin 10.7, stable.  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  Creatinine stable, 0.9 today   Depression -Continue Prozac  Scheduled Meds:  aspirin  81 mg Oral BID   docusate sodium  100 mg Oral BID   feeding supplement  237 mL Oral BID BM   FLUoxetine  40 mg Oral Daily   multivitamin with minerals  1 tablet Oral Daily   mupirocin ointment  1 Application Nasal BID   pantoprazole  40 mg Oral Daily   Continuous  Infusions:  sodium chloride 75 mL/hr at 10/09/22 1506   methocarbamol (ROBAXIN) IV     methocarbamol (ROBAXIN) IV     PRN Meds:.acetaminophen, alum & mag hydroxide-simeth, HYDROcodone-acetaminophen, HYDROcodone-acetaminophen, HYDROcodone-acetaminophen, menthol-cetylpyridinium **OR** phenol, methocarbamol **OR** methocarbamol (ROBAXIN) IV, methocarbamol **OR** methocarbamol (ROBAXIN) IV, metoCLOPramide **OR** metoCLOPramide (REGLAN) injection, morphine injection, morphine injection, naLOXone (NARCAN)  injection  Current Outpatient Medications  Medication Instructions   amLODipine-olmesartan (AZOR) 10-40 MG per tablet 0.25 tablets, Daily   aspirin 325 mg, Oral, Daily, Take on Monday, Wednesday, and Friday   Biotin 5,000 mcg, Oral, Daily   Cyanocobalamin (VITAMIN B-12 PO) 1 tablet, Daily   FLUoxetine (PROZAC) 40 mg, Daily   Multiple Vitamins-Minerals (ICAPS AREDS 2 PO) 1 tablet, Oral, 2 times daily   Repatha SureClick 009 mg, Subcutaneous, Every 14 days, Every other Thursday    Vitamin D, Cholecalciferol, 25 MCG (1000 UT) TABS 3,000 tablets, Oral, Daily    Diet Orders (From admission, onward)     Start     Ordered   10/09/22 1308  Diet regular Room service appropriate? Yes; Fluid consistency: Thin  Diet effective now       Question Answer Comment  Room service appropriate? Yes   Fluid consistency: Thin      10/09/22 1307            DVT prophylaxis: SCDs Start: 10/09/22 1417 SCDs Start: 10/09/22 0033   Lab Results  Component Value Date   PLT 205 10/10/2022  Code Status: Full Code  Family Communication: No family at bedside  Status is: Inpatient  Remains inpatient appropriate because: Postop day 1, may need SNF  Level of care: Telemetry Medical  Consultants:  Orthopedic surgery   Objective: Vitals:   10/09/22 2011 10/09/22 2330 10/10/22 0000 10/10/22 0921  BP: 139/70 (!) 156/69 (!) 147/70 (!) 142/62  Pulse:    86  Resp: 15   17  Temp: (!) 97.5 F (36.4  C)   98.4 F (36.9 C)  TempSrc: Axillary   Oral  SpO2:    96%  Weight:      Height:        Intake/Output Summary (Last 24 hours) at 10/10/2022 1023 Last data filed at 10/10/2022 0500 Gross per 24 hour  Intake 2083.29 ml  Output 200 ml  Net 1883.29 ml   Wt Readings from Last 3 Encounters:  10/08/22 61.2 kg  06/10/21 67.1 kg  08/28/15 72.1 kg    Examination:  Constitutional: NAD Eyes: no scleral icterus ENMT: Mucous membranes are moist.  Neck: normal, supple Respiratory: clear to auscultation bilaterally, no wheezing, no crackles. Normal respiratory effort. No accessory muscle use.  Cardiovascular: Regular rate and rhythm, no murmurs / rubs / gallops. No LE edema.  Abdomen: non distended, no tenderness. Bowel sounds positive.  Musculoskeletal: no clubbing / cyanosis.  Skin: no rashes  Data Reviewed: I have independently reviewed following labs and imaging studies   CBC Recent Labs  Lab 10/08/22 2228 10/09/22 0709 10/10/22 0307  WBC 13.1* 9.1 13.7*  HGB 13.7 11.8* 10.7*  HCT 39.6 34.9* 31.7*  PLT 276 226 205  MCV 88.0 88.4 89.5  MCH 30.4 29.9 30.2  MCHC 34.6 33.8 33.8  RDW 12.4 12.4 12.5  LYMPHSABS 1.3  --   --   MONOABS 0.6  --   --   EOSABS 0.0  --   --   BASOSABS 0.0  --   --     Recent Labs  Lab 10/08/22 2228 10/09/22 0709 10/10/22 0307  NA 141 142 140  K 3.3* 3.9 4.1  CL 107 109 106  CO2 21* 23 25  GLUCOSE 121* 96 116*  BUN '18 13 11  '$ CREATININE 1.24* 1.04* 0.95  CALCIUM 9.9 9.3 9.5  AST  --   --  27  ALT  --   --  22  ALKPHOS  --   --  76  BILITOT  --   --  0.6  ALBUMIN  --   --  2.9*  MG  --  1.8 1.7    ------------------------------------------------------------------------------------------------------------------ No results for input(s): "CHOL", "HDL", "LDLCALC", "TRIG", "CHOLHDL", "LDLDIRECT" in the last 72 hours.  Lab Results  Component Value Date   HGBA1C 5.3 08/01/2011    ------------------------------------------------------------------------------------------------------------------ No results for input(s): "TSH", "T4TOTAL", "T3FREE", "THYROIDAB" in the last 72 hours.  Invalid input(s): "FREET3"  Cardiac Enzymes No results for input(s): "CKMB", "TROPONINI", "MYOGLOBIN" in the last 168 hours.  Invalid input(s): "CK" ------------------------------------------------------------------------------------------------------------------ No results found for: "BNP"  CBG: No results for input(s): "GLUCAP" in the last 168 hours.  Recent Results (from the past 240 hour(s))  Surgical PCR screen     Status: None   Collection Time: 10/09/22  6:50 AM   Specimen: Nasal Mucosa; Nasal Swab  Result Value Ref Range Status   MRSA, PCR NEGATIVE NEGATIVE Final   Staphylococcus aureus NEGATIVE NEGATIVE Final    Comment: (NOTE) The Xpert SA Assay (FDA approved for NASAL specimens in patients 22 years of  age and older), is one component of a comprehensive surveillance program. It is not intended to diagnose infection nor to guide or monitor treatment. Performed at [facility], [place] [address] [place], [place] [address]      Radiology Studies: DG Pelvis Portable  Result Date: 10/09/2022 CLINICAL DATA:  [id number] Status post total replacement of right hip [id number] EXAM: PORTABLE PELVIS 1-2 VIEWS COMPARISON:  CT from previous day FINDINGS: Components of right hip arthroplasty project in expected location. No fracture or dislocation. IMPRESSION: Right hip arthroplasty without apparent complications. Electronically Signed   By: Lucrezia Europe M.D.   On: 10/09/2022 14:46   DG HIP UNILAT WITH PELVIS 1V RIGHT  Result Date: 10/09/2022 CLINICAL DATA:  Right hip arthroplasty. EXAM: DG HIP (WITH OR WITHOUT PELVIS) 1V RIGHT; DG C-ARM 1-60 MIN-NO REPORT COMPARISON:  10/08/2022 FINDINGS: Interval placement of right total hip arthroplasty. Prosthetic components are normally  located and in adequate position. 3 mm of lucency between the prosthesis and cortex of the adjacent medial proximal femur on 1 of the 3 images. Remainder of the exam is unremarkable. IMPRESSION: Expected changes post right total hip arthroplasty. Electronically Signed   By: Marin Olp M.D.   On: 10/09/2022 12:54   DG C-Arm 1-60 Min-No Report  Result Date: 10/09/2022 Fluoroscopy was utilized by the requesting physician.  No radiographic interpretation.     Marzetta Board, MD, PhD [facility]  Between 7 am - 7 pm I am available, please contact me via Amion (for emergencies) or Securechat (non urgent messages)  Between 7 pm - 7 am I am not available, please contact night coverage MD/APP via Amion

## 2022-10-10 NOTE — Evaluation (Signed)
Physical Therapy Evaluation Patient Details Name: [name] MRN: [id number] DOB: [birth year] Today's Date: 10/10/2022  History of Present Illness  Patient is 81 y.o. female s/p Rt THA anterior approach on 10/09/22 secondary to mechanical fall resulting in femoral neck fracture. PMH significant for OA, CVA, GERD, HTN, depression.    Clinical Impression  [name] is a 81 y.o. female POD 1 s/p Rt THA due to fall and femoral neck fracture. Patient reports mod independence in home with use of RW for mobility at baseline and reports increased falls recently. Patient is now limited by functional impairments (see PT problem list below) and requires Mod-Max assist for bed mobility and transfers with RW. Patient was able to stand and take small steps with RW and mod assist to move to [facility]. Despite cues for safety to sit pt holding mini-squat to void and Mod-Max assist required to guide pt back to bed. Pt demonstrating limited effort to initiate mobility and repeated cues required to encourage pt to use Ue's and Lt Le to assist with all functional mobility as pt limited by Lt LE pain. Patient will benefit from continued skilled PT interventions to address impairments and progress towards PLOF. Recommending ST rehab at SNF at this time.     Recommendations for follow up therapy are one component of a multi-disciplinary discharge planning process, led by the attending physician.  Recommendations may be updated based on patient status, additional functional criteria and insurance authorization.  Follow Up Recommendations Skilled nursing-short term rehab (<3 hours/day) Can patient physically be transported by private vehicle: No    Assistance Recommended at Discharge Frequent or constant Supervision/Assistance  Patient can return home with the following  Two people to help with walking and/or transfers;Two people to help with bathing/dressing/bathroom;Assistance with cooking/housework;Direct  supervision/assist for medications management;Assist for transportation;Help with stairs or ramp for entrance    Equipment Recommendations None recommended by PT  Recommendations for Other Services    OT consult;    Functional Status Assessment Patient has had a recent decline in their functional status and demonstrates the ability to make significant improvements in function in a reasonable and predictable amount of time.     Precautions / Restrictions Precautions Precautions: Fall Restrictions Weight Bearing Restrictions: Yes RLE Weight Bearing: Non weight bearing      Mobility  Bed Mobility Overal bed mobility: Needs Assistance Bed Mobility: Supine to Sit, Sit to Supine     Supine to sit: Mod assist, HOB elevated Sit to supine: Max assist, +2 for physical assistance, +2 for safety/equipment    Max encouragement to initiate any movement of LE's and UE's. Mod Assist to bring trunk up and use of bed pad to pivot and set feet on floor while sitting EOB. BP stable. Max+2 to return to supine.     Transfers Overall transfer level: Needs assistance   Transfers: Sit to/from Stand, Bed to chair/wheelchair/BSC Sit to Stand: Mod assist, From elevated surface   Step pivot transfers: Mod assist, Max assist      Mod-Max to rise from elevated EOB and take small steps to [facility] to void bladder. Pt would not fully sit despite cues for safety. Max assist to fully stand and guide steps back to EOB.       Ambulation/Gait                  Stairs            Wheelchair Mobility    Modified Rankin (Stroke Patients  Only)       Balance                                             Pertinent Vitals/Pain Pain Assessment Pain Assessment: Faces Faces Pain Scale: Hurts even more Pain Location: Rt hip Pain Descriptors / Indicators: Aching, Discomfort, Guarding, Sore Pain Intervention(s): Limited activity within patient's tolerance, Monitored during  session, Repositioned, RN gave pain meds during session    [place] expects to be discharged to:: Private residence Living Arrangements: Alone Available Help at Discharge: Available PRN/intermittently Type of Home: House Home Access: Stairs to enter Entrance Stairs-Rails: Can reach both;None Entrance Stairs-Number of Steps: 5 at the back (2 rails), 3 at the front (no rails)   Home Layout: One level   Additional Comments: husband passed away 1 year ago in 2023-01-11. Her son lives near and brings her the paper and helps occasionally.    Prior Function Prior Level of Function : Independent/Modified Independent;History of Falls (last six months)             Mobility Comments: pt has been living alone and mobilizing with RW. she reporst increased falls recently.       Hand Dominance        Extremity/Trunk Assessment   Upper Extremity Assessment Upper Extremity Assessment: Defer to OT evaluation    Lower Extremity Assessment Lower Extremity Assessment: Generalized weakness    Cervical / Trunk Assessment Cervical / Trunk Assessment: Other exceptions Cervical / Trunk Exceptions: habitus  Communication   Communication: No difficulties  Cognition Arousal/Alertness: Awake/alert Behavior During Therapy: WFL for tasks assessed/performed Overall Cognitive Status: Within Functional Limits for tasks assessed                                          General Comments      Exercises     Assessment/Plan    PT Assessment Patient needs continued PT services  PT Problem List Decreased strength;Decreased range of motion;Decreased activity tolerance;Decreased balance;Decreased mobility;Decreased coordination;Decreased knowledge of use of DME;Decreased safety awareness;Decreased knowledge of precautions;Obesity;Pain       PT Treatment Interventions DME instruction;Gait training;Stair training;Functional mobility training;Therapeutic  activities;Therapeutic exercise;Balance training;Patient/family education    PT Goals (Current goals can be found in the Care Plan section)  Acute Rehab PT Goals Patient Stated Goal: stop hurting PT Goal Formulation: With patient Time For Goal Achievement: 10/24/22 Potential to Achieve Goals: Good    Frequency Min 3X/week     Co-evaluation               AM-PAC PT "6 Clicks" Mobility  Outcome Measure Help needed turning from your back to your side while in a flat bed without using bedrails?: A Lot Help needed moving from lying on your back to sitting on the side of a flat bed without using bedrails?: A Lot Help needed moving to and from a bed to a chair (including a wheelchair)?: A Lot Help needed standing up from a chair using your arms (e.g., wheelchair or bedside chair)?: A Lot Help needed to walk in hospital room?: Total Help needed climbing 3-5 steps with a railing? : Total 6 Click Score: 10    End of Session Equipment Utilized During Treatment: Gait belt Activity Tolerance: Patient tolerated  treatment well (self limiting) Patient left: in bed;with call bell/phone within reach;with bed alarm set Nurse Communication: Mobility status PT Visit Diagnosis: Muscle weakness (generalized) (M62.81);Difficulty in walking, not elsewhere classified (R26.2);Pain Pain - Right/Left: Right Pain - part of body: Hip    Time: 6237-6283 PT Time Calculation (min) (ACUTE ONLY): 37 min   Charges:   PT Evaluation $PT Eval Moderate Complexity: 1 Mod PT Treatments $Therapeutic Activity: 8-22 mins        Verner Mould, DPT Acute Rehabilitation Services Office [phone]  10/10/22 12:19 PM

## 2022-10-11 DIAGNOSIS — E876 Hypokalemia: Secondary | ICD-10-CM | POA: Diagnosis not present

## 2022-10-11 DIAGNOSIS — S72009D Fracture of unspecified part of neck of unspecified femur, subsequent encounter for closed fracture with routine healing: Secondary | ICD-10-CM | POA: Diagnosis not present

## 2022-10-11 DIAGNOSIS — I10 Essential (primary) hypertension: Secondary | ICD-10-CM | POA: Diagnosis not present

## 2022-10-11 DIAGNOSIS — D72829 Elevated white blood cell count, unspecified: Secondary | ICD-10-CM | POA: Diagnosis not present

## 2022-10-11 LAB — BASIC METABOLIC PANEL
Anion gap: 9 (ref 5–15)
BUN: 14 mg/dL (ref 8–23)
CO2: 24 mmol/L (ref 22–32)
Calcium: 9.4 mg/dL (ref 8.9–10.3)
Chloride: 105 mmol/L (ref 98–111)
Creatinine, Ser: 1.06 mg/dL — ABNORMAL HIGH (ref 0.44–1.00)
GFR, Estimated: 53 mL/min — ABNORMAL LOW (ref >60)
Glucose, Bld: 120 mg/dL — ABNORMAL HIGH (ref 70–99)
Potassium: 3.9 mmol/L (ref 3.5–5.1)
Sodium: 138 mmol/L (ref 135–145)

## 2022-10-11 LAB — CBC
HCT: 28.9 % — ABNORMAL LOW (ref 36.0–46.0)
Hemoglobin: 9.9 g/dL — ABNORMAL LOW (ref 12.0–15.0)
MCH: 30.6 pg (ref 26.0–34.0)
MCHC: 34.3 g/dL (ref 30.0–36.0)
MCV: 89.2 fL (ref 80.0–100.0)
Platelets: 175 10*3/uL (ref 150–400)
RBC: 3.24 MIL/uL — ABNORMAL LOW (ref 3.87–5.11)
RDW: 12.6 % (ref 11.5–15.5)
WBC: 12 10*3/uL — ABNORMAL HIGH (ref 4.0–10.5)
nRBC: 0 % (ref 0.0–0.2)

## 2022-10-11 NOTE — Progress Notes (Signed)
PROGRESS NOTE  Julissa Browning [facility] IDP:[id number] DOB: 03-May-1941 DOA: 10/08/2022 PCP: Charlane Ferretti, MD   LOS: 2 days   Brief Narrative / Interim history: 81 year old female with prior CVA in 2012, HTN, depression, who comes into the hospital after ground-level fall.  She was at her daughter's house, just finished Thanksgiving dinner, and upon going down 2 steps into the garage she fell to the floor.  She felt right hip pain and inability to ambulate and was brought to the hospital.  She was found to have right hip fracture, orthopedic surgery was consulted   Subjective / 24h Interval events: She hates morning blood work.  Feels well, eating well.  No nausea or vomiting.  Assesement and Plan: Principal Problem:   Basicervical fracture of neck of right femur (HCC) Active Problems:   Hip fracture (HCC)   Leukocytosis   Hypokalemia   QT prolongation   History of stroke   Essential hypertension   Depression   Principal problem Acute displaced and impacted right femoral neck fracture -Secondary to a mechanical fall.  Orthopedic surgery consulted, she is status post THA by Dr. Ninfa Linden on 11/24.  Remained stable postop, needs SNF.  Stable to go on Monday   Active problems Mild leukocytosis -likely reactive.  Monitor.  Afebrile, WBC improving  Mild hypokalemia -Potassium improved with repletion, now normal  History of CVA -continue aspirin Mild anemia of chronic disease-hemoglobin stable postoperatively.  No bleeding.  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  Creatinine stable. Depression -Continue Prozac  Scheduled Meds:  aspirin  81 mg Oral BID   docusate sodium  100 mg Oral BID   feeding supplement  237 mL Oral BID BM   FLUoxetine  40 mg Oral Daily   multivitamin with minerals  1 tablet Oral Daily   mupirocin ointment  1 Application Nasal BID   pantoprazole  40 mg Oral  Daily   Continuous Infusions:  sodium chloride 75 mL/hr at 10/09/22 1506   methocarbamol (ROBAXIN) IV     methocarbamol (ROBAXIN) IV     PRN Meds:.acetaminophen, alum & mag hydroxide-simeth, HYDROcodone-acetaminophen, HYDROcodone-acetaminophen, HYDROcodone-acetaminophen, menthol-cetylpyridinium **OR** phenol, methocarbamol **OR** methocarbamol (ROBAXIN) IV, methocarbamol **OR** methocarbamol (ROBAXIN) IV, metoCLOPramide **OR** metoCLOPramide (REGLAN) injection, morphine injection, morphine injection, naLOXone (NARCAN)  injection  Current Outpatient Medications  Medication Instructions   amLODipine-olmesartan (AZOR) 10-40 MG per tablet 0.25 tablets, Daily   aspirin 325 mg, Oral, Daily, Take on Monday, Wednesday, and Friday   Biotin 5,000 mcg, Oral, Daily   Cyanocobalamin (VITAMIN B-12 PO) 1 tablet, Daily   FLUoxetine (PROZAC) 40 mg, Daily   Multiple Vitamins-Minerals (ICAPS AREDS 2 PO) 1 tablet, Oral, 2 times daily   Repatha SureClick 443 mg, Subcutaneous, Every 14 days, Every other Thursday    Vitamin D, Cholecalciferol, 25 MCG (1000 UT) TABS 3,000 tablets, Oral, Daily    Diet Orders (From admission, onward)     Start     Ordered   10/09/22 1308  Diet regular Room service appropriate? Yes; Fluid consistency: Thin  Diet effective now       Question Answer Comment  Room service appropriate? Yes   Fluid consistency: Thin      10/09/22 1307            DVT prophylaxis: SCDs Start: 10/09/22 1417 SCDs Start: 10/09/22 0033   Lab Results  Component Value Date   PLT 175 10/11/2022  Code Status: Full Code  Family Communication: No family at bedside  Status is: Inpatient  Remains inpatient appropriate because: SNF on Monday if bed available  Level of care: Med-Surg  Consultants:  Orthopedic surgery   Objective: Vitals:   10/10/22 0000 10/10/22 0921 10/10/22 2130 10/11/22 0900  BP: (!) 147/70 (!) 142/62 (!) 143/61 (!) 140/67  Pulse:  86 92 86  Resp:  17  17   Temp:  98.4 F (36.9 C) 97.6 F (36.4 C) 97.8 F (36.6 C)  TempSrc:  Oral Oral Oral  SpO2:  96% 97% 93%  Weight:      Height:       No intake or output data in the 24 hours ending 10/11/22 1056  Wt Readings from Last 3 Encounters:  10/08/22 61.2 kg  06/10/21 67.1 kg  08/28/15 72.1 kg    Examination:  Constitutional: NAD Eyes: lids and conjunctivae normal, no scleral icterus ENMT: mmm Neck: normal, supple Respiratory: clear to auscultation bilaterally, no wheezing, no crackles. Normal respiratory effort.  Cardiovascular: Regular rate and rhythm, no murmurs / rubs / gallops. No LE edema. Abdomen: soft, no distention, no tenderness. Bowel sounds positive.  Skin: no rashes Neurologic: no focal deficits, equal strength  Data Reviewed: I have independently reviewed following labs and imaging studies   CBC Recent Labs  Lab 10/08/22 2228 10/09/22 0709 10/10/22 0307 10/11/22 0323  WBC 13.1* 9.1 13.7* 12.0*  HGB 13.7 11.8* 10.7* 9.9*  HCT 39.6 34.9* 31.7* 28.9*  PLT 276 226 205 175  MCV 88.0 88.4 89.5 89.2  MCH 30.4 29.9 30.2 30.6  MCHC 34.6 33.8 33.8 34.3  RDW 12.4 12.4 12.5 12.6  LYMPHSABS 1.3  --   --   --   MONOABS 0.6  --   --   --   EOSABS 0.0  --   --   --   BASOSABS 0.0  --   --   --      Recent Labs  Lab 10/08/22 2228 10/09/22 0709 10/10/22 0307 10/11/22 0323  NA 141 142 140 138  K 3.3* 3.9 4.1 3.9  CL 107 109 106 105  CO2 21* '23 25 24  '$ GLUCOSE 121* 96 116* 120*  BUN '18 13 11 14  '$ CREATININE 1.24* 1.04* 0.95 1.06*  CALCIUM 9.9 9.3 9.5 9.4  AST  --   --  27  --   ALT  --   --  22  --   ALKPHOS  --   --  76  --   BILITOT  --   --  0.6  --   ALBUMIN  --   --  2.9*  --   MG  --  1.8 1.7  --      ------------------------------------------------------------------------------------------------------------------ No results for input(s): "CHOL", "HDL", "LDLCALC", "TRIG", "CHOLHDL", "LDLDIRECT" in the last 72 hours.  Lab Results  Component Value  Date   HGBA1C 5.3 08/01/2011   ------------------------------------------------------------------------------------------------------------------ No results for input(s): "TSH", "T4TOTAL", "T3FREE", "THYROIDAB" in the last 72 hours.  Invalid input(s): "FREET3"  Cardiac Enzymes No results for input(s): "CKMB", "TROPONINI", "MYOGLOBIN" in the last 168 hours.  Invalid input(s): "CK" ------------------------------------------------------------------------------------------------------------------ No results found for: "BNP"  CBG: No results for input(s): "GLUCAP" in the last 168 hours.  Recent Results (from the past 240 hour(s))  Surgical PCR screen     Status: None   Collection Time: 10/09/22  6:50 AM   Specimen: Nasal Mucosa; Nasal Swab  Result Value Ref Range Status   MRSA,  PCR NEGATIVE NEGATIVE Final   Staphylococcus aureus NEGATIVE NEGATIVE Final    Comment: (NOTE) The Xpert SA Assay (FDA approved for NASAL specimens in patients 7 years of age and older), is one component of a comprehensive surveillance program. It is not intended to diagnose infection nor to guide or monitor treatment. Performed at [facility], [place] [address], [place], [place] [address]      Radiology Studies: No results found.   Marzetta Board, MD, PhD [facility]  Between 7 am - 7 pm I am available, please contact me via Amion (for emergencies) or Securechat (non urgent messages)  Between 7 pm - 7 am I am not available, please contact night coverage MD/APP via Amion

## 2022-10-11 NOTE — Progress Notes (Signed)
Subjective: 2 Days Post-Op Procedure(s) (LRB): TOTAL HIP ARTHROPLASTY ANTERIOR APPROACH (Right) Patient reports pain as moderate.  Pain medications are helping. Was able to pivot and get up to a chair yesterday. Says she has not done much walking yet. No thigh numbness.   Objective: Vital signs in last 24 hours: Temp:  [97.6 F (36.4 C)-98.4 F (36.9 C)] 97.6 F (36.4 C) (11/25 2130) Pulse Rate:  [86-92] 92 (11/25 2130) Resp:  [17] 17 (11/25 0921) BP: (142-143)/(61-62) 143/61 (11/25 2130) SpO2:  [96 %-97 %] 97 % (11/25 2130)  Intake/Output from previous day: No intake/output data recorded. Intake/Output this shift: No intake/output data recorded.  Recent Labs    10/08/22 2228 10/09/22 0709 10/10/22 0307 10/11/22 0323  HGB 13.7 11.8* 10.7* 9.9*    Recent Labs    10/10/22 0307 10/11/22 0323  WBC 13.7* 12.0*  RBC 3.54* 3.24*  HCT 31.7* 28.9*  PLT 205 175    Recent Labs    10/10/22 0307 10/11/22 0323  NA 140 138  K 4.1 3.9  CL 106 105  CO2 25 24  BUN 11 14  CREATININE 0.95 1.06*  GLUCOSE 116* 120*  CALCIUM 9.5 9.4    RLE: Sensation intact distally Intact pulses distally Dorsiflexion/Plantar flexion intact Incision: dressing C/D/I   Assessment/Plan: 2 Days Post-Op Procedure(s) (LRB): TOTAL HIP ARTHROPLASTY ANTERIOR APPROACH (Right) Up with therapy      Callie Fielding 10/11/2022, 8:26 AM

## 2022-10-11 NOTE — Progress Notes (Signed)
Physical Therapy Treatment Patient Details Name: [name] MRN: [id number] DOB: [birth year] Today's Date: 10/11/2022   History of Present Illness Patient is 81 y.o. female s/p Rt THA anterior approach on 10/09/22 secondary to mechanical fall resulting in femoral neck fracture. PMH significant for OA, CVA, GERD, HTN, depression.    PT Comments    Patient made good progress today with mobility and more motivated with family present. Mod Assist to bring Rt LE off EOB with use of belt and to fully sit upright. Pt able to initiate sit<>stand with single UE on EOB to power up and min assist to steady balance. Patient initiated gait training today and ambulated ~ 10' in room with mod assist and progressed to min assist with greater initiation to advance Rt LE. EOS pt restining recliner and educated on exercises for Rt LE. Will continue to progress pt as able.    Recommendations for follow up therapy are one component of a multi-disciplinary discharge planning process, led by the attending physician.  Recommendations may be updated based on patient status, additional functional criteria and insurance authorization.  Follow Up Recommendations  Skilled nursing-short term rehab (<3 hours/day) Can patient physically be transported by private vehicle: No   Assistance Recommended at Discharge Frequent or constant Supervision/Assistance  Patient can return home with the following Two people to help with walking and/or transfers;Two people to help with bathing/dressing/bathroom;Assistance with cooking/housework;Direct supervision/assist for medications management;Assist for transportation;Help with stairs or ramp for entrance   Equipment Recommendations  None recommended by PT    Recommendations for Other Services OT consult     Precautions / Restrictions Precautions Precautions: Fall Restrictions Weight Bearing Restrictions: No RLE Weight Bearing: Weight bearing as tolerated      Mobility  Bed Mobility Overal bed mobility: Needs Assistance Bed Mobility: Supine to Sit, Sit to Supine     Supine to sit: Mod assist, HOB elevated     General bed mobility comments: Max encouragement to initiate any movement of LE's and UE's. pt more easily motivated with family present. Pt required cues for use of bed to bring Rt LE off EOB. mod assist to complete and use of bed pad to pivot hips/trunk. pt able to use bil UE to scoot fully to EOB.    Transfers Overall transfer level: Needs assistance Equipment used: Rolling walker (2 wheels) Transfers: Sit to/from Stand Sit to Stand: From elevated surface, Min assist           General transfer comment: cues for hand placement, pt usign single UE to power up from EOB. EOB elevated slightly. Min assist to steady with full rise.    Ambulation/Gait Ambulation/Gait assistance: Mod assist, Min assist Gait Distance (Feet): 10 Feet Assistive device: Rolling walker (2 wheels) Gait Pattern/deviations: Step-to pattern, Decreased stride length, Decreased weight shift to right, Decreased step length - left, Decreased step length - right Gait velocity: decr     General Gait Details: Cues for step pattern and mod assist to facilitate weight shift Rt/Lt and initiate advancing Rt LE forward. ~halfway through gait pt improved ability to initiate stepping Rt LE and use of UE's to hop Lt foot up to Rt.   Stairs             Wheelchair Mobility    Modified Rankin (Stroke Patients Only)       Balance Overall balance assessment: Needs assistance Sitting-balance support: Feet supported, No upper extremity supported, Bilateral upper extremity supported Sitting balance-Leahy Scale: Fair  Standing balance support: During functional activity, Reliant on assistive device for balance, Bilateral upper extremity supported Standing balance-Leahy Scale: Poor                              Cognition Arousal/Alertness:  Awake/alert Behavior During Therapy: WFL for tasks assessed/performed Overall Cognitive Status: Within Functional Limits for tasks assessed                                          Exercises Total Joint Exercises Ankle Circles/Pumps: AROM, Both, 20 reps Long Arc Quad: AROM, Right, 10 reps    General Comments        Pertinent Vitals/Pain Pain Assessment Pain Assessment: Faces Faces Pain Scale: Hurts little more Pain Location: Rt hip Pain Descriptors / Indicators: Aching, Discomfort, Guarding, Sore Pain Intervention(s): Limited activity within patient's tolerance, Monitored during session, Repositioned, Ice applied    Home Living                          Prior Function            PT Goals (current goals can now be found in the care plan section) Acute Rehab PT Goals Patient Stated Goal: stop hurting PT Goal Formulation: With patient Time For Goal Achievement: 10/24/22 Potential to Achieve Goals: Good Progress towards PT goals: Progressing toward goals    Frequency    Min 3X/week      PT Plan Current plan remains appropriate    Co-evaluation              AM-PAC PT "6 Clicks" Mobility   Outcome Measure  Help needed turning from your back to your side while in a flat bed without using bedrails?: A Lot Help needed moving from lying on your back to sitting on the side of a flat bed without using bedrails?: A Lot Help needed moving to and from a bed to a chair (including a wheelchair)?: A Little Help needed standing up from a chair using your arms (e.g., wheelchair or bedside chair)?: A Little Help needed to walk in hospital room?: A Lot Help needed climbing 3-5 steps with a railing? : Total 6 Click Score: 13    End of Session Equipment Utilized During Treatment: Gait belt Activity Tolerance: Patient tolerated treatment well (self limiting) Patient left: in bed;with call bell/phone within reach;with bed alarm set Nurse  Communication: Mobility status PT Visit Diagnosis: Muscle weakness (generalized) (M62.81);Difficulty in walking, not elsewhere classified (R26.2);Pain Pain - Right/Left: Right Pain - part of body: Hip     Time: 7782-4235 PT Time Calculation (min) (ACUTE ONLY): 25 min  Charges:  $Gait Training: 8-22 mins $Therapeutic Exercise: 8-22 mins                     Verner Mould, DPT Acute Rehabilitation Services Office [phone]  10/11/22 2:05 PM

## 2022-10-11 NOTE — TOC Progression Note (Signed)
Transition of Care [facility]) - Progression Note    Patient Details  Name: SHARRA CAYABYAB MRN: [id number] Date of Birth: [birth year]  [facility] CM/SW Contact  Ina Homes, [place] Phone Number: 10/11/2022, 11:12 AM  Clinical Narrative:     SW met with pt at bedside to discuss preferences. Pt preferred [facility]. SW explained very unlikely to get offer from [facility] due to accepting their ILF/AFL residents first. SW encouraged pt to revisit list with family and decide on another SNF.   Expected Discharge Plan: [place] Barriers to Discharge: Continued Medical Work up, SNF Pending bed offer  Expected Discharge Plan and Services Expected Discharge Plan: [place] Choice: [place] arrangements for the past 2 months: Single Family Home                                       Social Determinants of Health (SDOH) Interventions Housing Interventions: Patient Refused  Readmission Risk Interventions     No data to display

## 2022-10-12 DIAGNOSIS — I10 Essential (primary) hypertension: Secondary | ICD-10-CM | POA: Diagnosis not present

## 2022-10-12 DIAGNOSIS — S72041A Displaced fracture of base of neck of right femur, initial encounter for closed fracture: Secondary | ICD-10-CM | POA: Diagnosis not present

## 2022-10-12 MED ORDER — HYDROCODONE-ACETAMINOPHEN 5-325 MG PO TABS: 1.0000 | ORAL_TABLET | ORAL | 0 refills | Status: DC | PRN

## 2022-10-12 MED ORDER — ASPIRIN 81 MG PO CHEW: 81.0000 mg | CHEWABLE_TABLET | Freq: Two times a day (BID) | ORAL | 0 refills | Status: DC

## 2022-10-12 NOTE — Discharge Summary (Signed)
Physician Discharge Summary  [name] [facility] VXB:[id number] DOB: 1941/07/20 DOA: 10/08/2022  PCP: Charlane Ferretti, MD  Admit date: 10/08/2022 Discharge date: 10/12/2022  Admitted From: home Disposition:  SNF  Recommendations for Outpatient Follow-up:  Follow up with PCP in 1-2 weeks Please obtain BMP/CBC in one week  Home Health: none Equipment/Devices: none  Discharge Condition: stable CODE STATUS: Full code Diet Orders (From admission, onward)     Start     Ordered   10/09/22 1308  Diet regular Room service appropriate? Yes; Fluid consistency: Thin  Diet effective now       Question Answer Comment  Room service appropriate? Yes   Fluid consistency: Thin      10/09/22 1307            HPI: Per admitting MD, [name] is a 81 y.o. female with medical history significant of CVA, depression, hypertension, GERD, arthritis presented to the ED via EMS complaining of right hip pain after a mechanical fall at home.  Hemodynamically stable.  Labs showing WBC 13.1, potassium 3.3, bicarb 21, glucose 121, creatinine 1.2.  Chest x-ray showing hiatal hernia and no acute finding.  CT showing acute displaced and impacted right femoral neck fracture. Patient was given morphine, Zofran, and IV fluids.  Orthopedics (Dr. Ninfa Linden) consulted and planning on taking the patient to the OR for a total hip arthroplasty 11/24 sometime in the afternoon.  [facility] called to admit. Patient states she was going into her garage when she missed a step and fell onto concrete.  She is endorsing pain in her right hip since after the fall and was not able to get up from the floor.  Reports scraping her face against a cardboard box when she felt but denies any injury to her head or loss of consciousness.  No facial pain or bruising.  Denies fevers, cough, shortness of breath, chest pain, vomiting, abdominal pain, diarrhea, or any urinary symptoms.  Per daughter, patient is currently undergoing physical  therapy due to problems with ambulation.    Hospital Course / Discharge diagnoses: Principal Problem:   Basicervical fracture of neck of right femur (HCC) Active Problems:   Hip fracture (HCC)   Leukocytosis   Hypokalemia   QT prolongation   History of stroke   Essential hypertension   Depression   Principal problem Acute displaced and impacted right femoral neck fracture -Secondary to a mechanical fall.  Orthopedic surgery consulted, she is status post THA by Dr. Ninfa Linden on 11/24.  Remained stable postop, needs SNF rehab   Active problems Mild leukocytosis -likely reactive.  Monitor.  Afebrile, WBC improving  Mild hypokalemia -Potassium improved with repletion, now normal  History of CVA -continue aspirin Mild anemia of chronic disease-hemoglobin stable postoperatively.  No bleeding.  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  Creatinine stable. Depression -Continue Prozac  Sepsis ruled out   Discharge Instructions   Allergies as of 10/12/2022       Reactions   Ambien [zolpidem Tartrate] Other (See Comments)   Very dizzy   Crestor  [rosuvastatin Calcium] Other (See Comments)   Myalgia   Lisinopril Cough   Sulfa Antibiotics Nausea And Vomiting   Penicillins Rash   Has patient had a PCN reaction causing immediate rash, facial/tongue/throat swelling, SOB or lightheadedness with hypotension: Yes Has patient had a PCN reaction causing severe rash involving mucus membranes or skin necrosis: No  Has patient had a PCN reaction that required hospitalization No Has patient had a PCN reaction occurring within the last 10 years: No If all of the above answers are "NO", then may proceed with Cephalosporin        Medication List     STOP taking these medications    aspirin 325 MG tablet Replaced by: aspirin 81 MG chewable tablet   Azor 10-40 MG tablet Generic  drug: amLODipine-olmesartan       TAKE these medications    aspirin 81 MG chewable tablet Chew 1 tablet (81 mg total) by mouth 2 (two) times daily. Replaces: aspirin 325 MG tablet   Biotin 5000 MCG Caps Take 5,000 mcg by mouth daily.   FLUoxetine 40 MG capsule Commonly known as: PROZAC Take 40 mg by mouth daily.   HYDROcodone-acetaminophen 5-325 MG tablet Commonly known as: NORCO/VICODIN Take 1-2 tablets by mouth every 4 (four) hours as needed for moderate pain (pain score 4-6).   ICAPS AREDS 2 PO Take 1 tablet by mouth in the morning and at bedtime.   Repatha SureClick 761 MG/ML Soaj Generic drug: Evolocumab Inject 140 mg into the skin every 14 (fourteen) days. Every other Thursday   VITAMIN B-12 PO Take 1 tablet by mouth daily.   Vitamin D (Cholecalciferol) 25 MCG (1000 UT) Tabs Take 3,000 tablets by mouth daily.               Durable Medical Equipment  (From admission, onward)           Start     Ordered   10/09/22 1417  DME 3 n 1  Once        10/09/22 1416   10/09/22 1417  DME Walker rolling  Once       Question Answer Comment  Walker: With 5 Inch Wheels   Patient needs a walker to treat with the following condition Status post total hip replacement, right      10/09/22 1416            Follow-up Information     Mcarthur Rossetti, MD. Schedule an appointment as soon as possible for a visit in 2 week(s).   Specialty: Orthopedic Surgery Contact information: [address] [place] [address] [phone]                 Consultations: Orthopedic surgery   Procedures/Studies:  DG Pelvis Portable  Result Date: 10/09/2022 CLINICAL DATA:  [id number] Status post total replacement of right hip [id number] EXAM: PORTABLE PELVIS 1-2 VIEWS COMPARISON:  CT from previous day FINDINGS: Components of right hip arthroplasty project in expected location. No fracture or dislocation. IMPRESSION: Right hip arthroplasty without apparent  complications. Electronically Signed   By: Lucrezia Europe M.D.   On: 10/09/2022 14:46   DG HIP UNILAT WITH PELVIS 1V RIGHT  Result Date: 10/09/2022 CLINICAL DATA:  Right hip arthroplasty. EXAM: DG HIP (WITH OR WITHOUT PELVIS) 1V RIGHT; DG C-ARM 1-60 MIN-NO REPORT COMPARISON:  10/08/2022 FINDINGS: Interval placement of right total hip arthroplasty. Prosthetic components are normally located and in adequate position. 3 mm of lucency between the prosthesis and cortex of the adjacent medial proximal femur on 1 of the 3 images. Remainder of the exam is unremarkable. IMPRESSION: Expected changes post right total hip arthroplasty. Electronically Signed   By: Marin Olp M.D.   On: 10/09/2022 12:54   DG C-Arm 1-60 Min-No Report  Result Date: 10/09/2022 Fluoroscopy was utilized by the requesting physician.  No radiographic  interpretation.   CT Hip Right Wo Contrast  Result Date: 10/08/2022 CLINICAL DATA:  Hip trauma, fracture suspected, no prior imaging EXAM: CT OF THE RIGHT HIP WITHOUT CONTRAST TECHNIQUE: Multidetector CT imaging of the right hip was performed according to the standard protocol. Multiplanar CT image reconstructions were also generated. RADIATION DOSE REDUCTION: This exam was performed according to the departmental dose-optimization program which includes automated exposure control, adjustment of the mA and/or kV according to patient size and/or use of iterative reconstruction technique. COMPARISON:  X-ray right hip 10/08/2022 FINDINGS: Bones/Joint/Cartilage Acute displaced and impacted right femoral neck fracture. No other acute fracture of the right hip identified. No right hip dislocation. Ligaments Suboptimally assessed by CT. Muscles and Tendons Grossly unremarkable. Soft tissues No large hematoma formation. IMPRESSION: Acute displaced and impacted right femoral neck fracture. Electronically Signed   By: Iven Finn M.D.   On: 10/08/2022 22:02   DG Chest 2 View  Result Date:  10/08/2022 CLINICAL DATA:  Shortness of breath. Fall at home. EXAM: CHEST - 2 VIEW COMPARISON:  Remote CT 09/25/2009 esophagram 06/26/2015 FINDINGS: Normal heart size. There is a retrocardiac hiatal hernia. The lungs are clear. Pulmonary vasculature is normal. No consolidation, pleural effusion, or pneumothorax. No acute osseous abnormalities are seen. IMPRESSION: 1. No acute chest findings. 2. Hiatal hernia. Electronically Signed   By: Keith Rake M.D.   On: 10/08/2022 19:30   DG Hip Unilat  With Pelvis 2-3 Views Right  Result Date: 10/08/2022 CLINICAL DATA:  Fall EXAM: DG HIP (WITH OR WITHOUT PELVIS) 2-3V RIGHT COMPARISON:  None Available. FINDINGS: Degenerative changes in the hips bilaterally with joint space narrowing and spurring. Degenerative changes in the visualized lower lumbar spine. SI joints symmetric. No acute bony abnormality. Specifically, no fracture, subluxation, or dislocation. IMPRESSION: No acute bony abnormality. Electronically Signed   By: Rolm Baptise M.D.   On: 10/08/2022 19:03     Subjective: - no chest pain, shortness of breath, no abdominal pain, nausea or vomiting.   Discharge Exam: BP (!) 166/69 (BP Location: Left Arm)   Pulse 80   Temp 97.9 F (36.6 C) (Oral)   Resp 17   Ht '4\' 11"'$  (1.499 m)   Wt 61.2 kg   SpO2 96%   BMI 27.27 kg/m   Constitutional: NAD Respiratory: clear to auscultation bilaterally, no wheezing, no crackles. Normal respiratory effort.  Cardiovascular: Regular rate and rhythm, no murmurs / rubs / gallops. No LE edema. Abdomen: soft, no distention, no tenderness. Bowel sounds positive.  Neurologic: no focal deficits, equal strength    The results of significant diagnostics from this hospitalization (including imaging, microbiology, ancillary and laboratory) are listed below for reference.     Microbiology: Recent Results (from the past 240 hour(s))  Surgical PCR screen     Status: None   Collection Time: 10/09/22  6:50 AM    Specimen: Nasal Mucosa; Nasal Swab  Result Value Ref Range Status   MRSA, PCR NEGATIVE NEGATIVE Final   Staphylococcus aureus NEGATIVE NEGATIVE Final    Comment: (NOTE) The Xpert SA Assay (FDA approved for NASAL specimens in patients 89 years of age and older), is one component of a comprehensive surveillance program. It is not intended to diagnose infection nor to guide or monitor treatment. Performed at [facility], [place] [address] [place], [place] [address]      Labs: Basic Metabolic Panel: Recent Labs  Lab 10/08/22 2228 10/09/22 0709 10/10/22 0307 10/11/22 0323  NA 141 142 140 138  K 3.3* 3.9 4.1 3.9  CL 107 109 106 105  CO2 21* '23 25 24  '$ GLUCOSE 121* 96 116* 120*  BUN '18 13 11 14  '$ CREATININE 1.24* 1.04* 0.95 1.06*  CALCIUM 9.9 9.3 9.5 9.4  MG  --  1.8 1.7  --    Liver Function Tests: Recent Labs  Lab 10/10/22 0307  AST 27  ALT 22  ALKPHOS 76  BILITOT 0.6  PROT 5.6*  ALBUMIN 2.9*   CBC: Recent Labs  Lab 10/08/22 2228 10/09/22 0709 10/10/22 0307 10/11/22 0323  WBC 13.1* 9.1 13.7* 12.0*  NEUTROABS 11.0*  --   --   --   HGB 13.7 11.8* 10.7* 9.9*  HCT 39.6 34.9* 31.7* 28.9*  MCV 88.0 88.4 89.5 89.2  PLT 276 226 205 175   CBG: No results for input(s): "GLUCAP" in the last 168 hours. Hgb A1c No results for input(s): "HGBA1C" in the last 72 hours. Lipid Profile No results for input(s): "CHOL", "HDL", "LDLCALC", "TRIG", "CHOLHDL", "LDLDIRECT" in the last 72 hours. Thyroid function studies No results for input(s): "TSH", "T4TOTAL", "T3FREE", "THYROIDAB" in the last 72 hours.  Invalid input(s): "FREET3" Urinalysis    Component Value Date/Time   COLORURINE YELLOW (A) 10/09/2022 1037   APPEARANCEUR HAZY (A) 10/09/2022 1037   LABSPEC 1.026 10/09/2022 1037   PHURINE 5.0 10/09/2022 1037   GLUCOSEU NEGATIVE 10/09/2022 1037   HGBUR NEGATIVE 10/09/2022 1037   BILIRUBINUR NEGATIVE 10/09/2022 1037   KETONESUR NEGATIVE 10/09/2022 1037   PROTEINUR  NEGATIVE 10/09/2022 1037   UROBILINOGEN 0.2 02/12/2009 0912   NITRITE NEGATIVE 10/09/2022 1037   LEUKOCYTESUR LARGE (A) 10/09/2022 1037    FURTHER DISCHARGE INSTRUCTIONS:   Get Medicines reviewed and adjusted: Please take all your medications with you for your next visit with your Primary MD   Laboratory/radiological data: Please request your Primary MD to go over all hospital tests and procedure/radiological results at the follow up, please ask your Primary MD to get all Hospital records sent to his/her office.   In some cases, they will be blood work, cultures and biopsy results pending at the time of your discharge. Please request that your primary care M.D. goes through all the records of your hospital data and follows up on these results.   Also Note the following: If you experience worsening of your admission symptoms, develop shortness of breath, life threatening emergency, suicidal or homicidal thoughts you must seek medical attention immediately by calling 911 or calling your MD immediately  if symptoms less severe.   You must read complete instructions/literature along with all the possible adverse reactions/side effects for all the Medicines you take and that have been prescribed to you. Take any new Medicines after you have completely understood and accpet all the possible adverse reactions/side effects.    Do not drive when taking Pain medications or sleeping medications (Benzodaizepines)   Do not take more than prescribed Pain, Sleep and Anxiety Medications. It is not advisable to combine anxiety,sleep and pain medications without talking with your primary care practitioner   Special Instructions: If you have smoked or chewed Tobacco  in the last 2 yrs please stop smoking, stop any regular Alcohol  and or any Recreational drug use.   Wear Seat belts while driving.   Please note: You were cared for by a hospitalist during your hospital stay. Once you are discharged, your  primary care physician will handle any further medical issues. Please note that NO REFILLS for any discharge medications will  be authorized once you are discharged, as it is imperative that you return to your primary care physician (or establish a relationship with a primary care physician if you do not have one) for your post hospital discharge needs so that they can reassess your need for medications and monitor your lab values.  Time coordinating discharge: 40 minutes  SIGNED:  Marzetta Board, MD, PhD 10/12/2022, 12:04 PM

## 2022-10-12 NOTE — Anesthesia Postprocedure Evaluation (Signed)
Anesthesia Post Note  Patient: [name] [facility]  Procedure(s) Performed: TOTAL HIP ARTHROPLASTY ANTERIOR APPROACH (Right: Hip)     Patient location during evaluation: PACU Anesthesia Type: General Level of consciousness: awake and alert Pain management: pain level controlled Vital Signs Assessment: post-procedure vital signs reviewed and stable Respiratory status: spontaneous breathing, nonlabored ventilation, respiratory function stable and patient connected to nasal cannula oxygen Cardiovascular status: blood pressure returned to baseline and stable Postop Assessment: no apparent nausea or vomiting Anesthetic complications: yes  Encounter Notable Events  Notable Event Outcome Phase Comment  Difficult to intubate - unexpected  Intraprocedure Filed from anesthesia note documentation.    Last Vitals:  Vitals:   10/11/22 2035 10/12/22 0834  BP: (!) 140/65 (!) 166/69  Pulse: 83 80  Resp: 18 17  Temp: (!) 36.4 C 36.6 C  SpO2: 100% 96%    Last Pain:  Vitals:   10/12/22 0834  TempSrc: Oral  PainSc:                  [name]

## 2022-10-12 NOTE — Progress Notes (Signed)
Subjective: 3 Days Post-Op Procedure(s) (LRB): TOTAL HIP ARTHROPLASTY ANTERIOR APPROACH (Right) Patient reports pain as moderate.  Slow mobility with therapy.  Short-term SNF has been recommended.  Objective: Vital signs in last 24 hours: Temp:  [97.5 F (36.4 C)-97.8 F (36.6 C)] 97.5 F (36.4 C) (11/26 2035) Pulse Rate:  [83-86] 83 (11/26 2035) Resp:  [17-18] 18 (11/26 2035) BP: (140)/(65-67) 140/65 (11/26 2035) SpO2:  [93 %-100 %] 100 % (11/26 2035)  Intake/Output from previous day: 11/26 0701 - 11/27 0700 In: -  Out: 100 [Urine:100] Intake/Output this shift: No intake/output data recorded.  Recent Labs    10/10/22 0307 10/11/22 0323  HGB 10.7* 9.9*   Recent Labs    10/10/22 0307 10/11/22 0323  WBC 13.7* 12.0*  RBC 3.54* 3.24*  HCT 31.7* 28.9*  PLT 205 175   Recent Labs    10/10/22 0307 10/11/22 0323  NA 140 138  K 4.1 3.9  CL 106 105  CO2 25 24  BUN 11 14  CREATININE 0.95 1.06*  GLUCOSE 116* 120*  CALCIUM 9.5 9.4   No results for input(s): "LABPT", "INR" in the last 72 hours.  Sensation intact distally Intact pulses distally Dorsiflexion/Plantar flexion intact Incision: dressing C/D/I   Assessment/Plan: 3 Days Post-Op Procedure(s) (LRB): TOTAL HIP ARTHROPLASTY ANTERIOR APPROACH (Right) Up with therapy OK for SNF from ortho standpoint. Baby aspirin twice daily for DVT coverage     Mcarthur Rossetti 10/12/2022, 7:45 AM

## 2022-10-12 NOTE — Progress Notes (Signed)
PROGRESS NOTE  [name] [facility] EHU:[id number] DOB: 1941-03-25 DOA: 10/08/2022 PCP: Charlane Ferretti, MD   LOS: 3 days   Brief Narrative / Interim history: 81 year old female with prior CVA in 2012, HTN, depression, who comes into the hospital after ground-level fall.  She was at her daughter's house, just finished Thanksgiving dinner, and upon going down 2 steps into the garage she fell to the floor.  She felt right hip pain and inability to ambulate and was brought to the hospital.  She was found to have right hip fracture, orthopedic surgery was consulted   Subjective / 24h Interval events: Stable for discharge. Awaiting SNF  Assesement and Plan: Principal Problem:   Basicervical fracture of neck of right femur (HCC) Active Problems:   Hip fracture (HCC)   Leukocytosis   Hypokalemia   QT prolongation   History of stroke   Essential hypertension   Depression   Principal problem Acute displaced and impacted right femoral neck fracture -Secondary to a mechanical fall.  Orthopedic surgery consulted, she is status post THA by Dr. Ninfa Linden on 11/24.  Remained stable postop, needs SNF.  Stable to go    Active problems Mild leukocytosis -likely reactive.  Monitor.  Afebrile, WBC improving  Mild hypokalemia -Potassium improved with repletion, now normal  History of CVA -continue aspirin Mild anemia of chronic disease-hemoglobin stable postoperatively.  No bleeding.  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  Creatinine stable. Depression -Continue Prozac  Scheduled Meds:  aspirin  81 mg Oral BID   docusate sodium  100 mg Oral BID   feeding supplement  237 mL Oral BID BM   FLUoxetine  40 mg Oral Daily   multivitamin with minerals  1 tablet Oral Daily   mupirocin ointment  1 Application Nasal BID   pantoprazole  40 mg Oral Daily   Continuous Infusions:  methocarbamol  (ROBAXIN) IV     methocarbamol (ROBAXIN) IV     PRN Meds:.acetaminophen, alum & mag hydroxide-simeth, HYDROcodone-acetaminophen, HYDROcodone-acetaminophen, HYDROcodone-acetaminophen, menthol-cetylpyridinium **OR** phenol, methocarbamol **OR** methocarbamol (ROBAXIN) IV, methocarbamol **OR** methocarbamol (ROBAXIN) IV, metoCLOPramide **OR** metoCLOPramide (REGLAN) injection, morphine injection, morphine injection, naLOXone (NARCAN)  injection  Current Outpatient Medications  Medication Instructions   amLODipine-olmesartan (AZOR) 10-40 MG per tablet 0.25 tablets, Daily   aspirin 325 mg, Oral, Daily, Take on Monday, Wednesday, and Friday   aspirin 81 mg, Oral, 2 times daily   Biotin 5,000 mcg, Oral, Daily   Cyanocobalamin (VITAMIN B-12 PO) 1 tablet, Daily   FLUoxetine (PROZAC) 40 mg, Daily   HYDROcodone-acetaminophen (NORCO/VICODIN) 5-325 MG tablet 1-2 tablets, Oral, Every 4 hours PRN   Multiple Vitamins-Minerals (ICAPS AREDS 2 PO) 1 tablet, Oral, 2 times daily   Repatha SureClick 785 mg, Subcutaneous, Every 14 days, Every other Thursday    Vitamin D, Cholecalciferol, 25 MCG (1000 UT) TABS 3,000 tablets, Oral, Daily    Diet Orders (From admission, onward)     Start     Ordered   10/09/22 1308  Diet regular Room service appropriate? Yes; Fluid consistency: Thin  Diet effective now       Question Answer Comment  Room service appropriate? Yes   Fluid consistency: Thin      10/09/22 1307            DVT prophylaxis: SCDs Start: 10/09/22 1417 SCDs Start: 10/09/22 0033   Lab Results  Component Value Date   PLT 175 10/11/2022  Code Status: Full Code  Family Communication: No family at bedside  Status is: Inpatient  Remains inpatient appropriate because: SNF on Monday if bed available  Level of care: Med-Surg  Consultants:  Orthopedic surgery   Objective: Vitals:   10/11/22 0900 10/11/22 2035 10/12/22 0834 10/12/22 1416  BP: (!) 140/67 (!) 140/65 (!) 166/69 121/69   Pulse: 86 83 80 92  Resp: '17 18 17 17  '$ Temp: 97.8 F (36.6 C) (!) 97.5 F (36.4 C) 97.9 F (36.6 C) 98 F (36.7 C)  TempSrc: Oral Oral Oral Oral  SpO2: 93% 100% 96% 95%  Weight:      Height:        Intake/Output Summary (Last 24 hours) at 10/12/2022 1742 Last data filed at 10/11/2022 2035 Gross per 24 hour  Intake --  Output 100 ml  Net -100 ml    Wt Readings from Last 3 Encounters:  10/08/22 61.2 kg  06/10/21 67.1 kg  08/28/15 72.1 kg    Examination:  Constitutional: nad Respiratory: CTA Cardiovascular: RRR  Data Reviewed: I have independently reviewed following labs and imaging studies   CBC Recent Labs  Lab 10/08/22 2228 10/09/22 0709 10/10/22 0307 10/11/22 0323  WBC 13.1* 9.1 13.7* 12.0*  HGB 13.7 11.8* 10.7* 9.9*  HCT 39.6 34.9* 31.7* 28.9*  PLT 276 226 205 175  MCV 88.0 88.4 89.5 89.2  MCH 30.4 29.9 30.2 30.6  MCHC 34.6 33.8 33.8 34.3  RDW 12.4 12.4 12.5 12.6  LYMPHSABS 1.3  --   --   --   MONOABS 0.6  --   --   --   EOSABS 0.0  --   --   --   BASOSABS 0.0  --   --   --      Recent Labs  Lab 10/08/22 2228 10/09/22 0709 10/10/22 0307 10/11/22 0323  NA 141 142 140 138  K 3.3* 3.9 4.1 3.9  CL 107 109 106 105  CO2 21* '23 25 24  '$ GLUCOSE 121* 96 116* 120*  BUN '18 13 11 14  '$ CREATININE 1.24* 1.04* 0.95 1.06*  CALCIUM 9.9 9.3 9.5 9.4  AST  --   --  27  --   ALT  --   --  22  --   ALKPHOS  --   --  76  --   BILITOT  --   --  0.6  --   ALBUMIN  --   --  2.9*  --   MG  --  1.8 1.7  --      ------------------------------------------------------------------------------------------------------------------ No results for input(s): "CHOL", "HDL", "LDLCALC", "TRIG", "CHOLHDL", "LDLDIRECT" in the last 72 hours.  Lab Results  Component Value Date   HGBA1C 5.3 08/01/2011   ------------------------------------------------------------------------------------------------------------------ No results for input(s): "TSH", "T4TOTAL", "T3FREE",  "THYROIDAB" in the last 72 hours.  Invalid input(s): "FREET3"  Cardiac Enzymes No results for input(s): "CKMB", "TROPONINI", "MYOGLOBIN" in the last 168 hours.  Invalid input(s): "CK" ------------------------------------------------------------------------------------------------------------------ No results found for: "BNP"  CBG: No results for input(s): "GLUCAP" in the last 168 hours.  Recent Results (from the past 240 hour(s))  Surgical PCR screen     Status: None   Collection Time: 10/09/22  6:50 AM   Specimen: Nasal Mucosa; Nasal Swab  Result Value Ref Range Status   MRSA, PCR NEGATIVE NEGATIVE Final   Staphylococcus aureus NEGATIVE NEGATIVE Final    Comment: (NOTE) The Xpert SA Assay (FDA approved for NASAL specimens in patients 3 years of age and  older), is one component of a comprehensive surveillance program. It is not intended to diagnose infection nor to guide or monitor treatment. Performed at [facility], [place] [address], [place], [place] [address]      Radiology Studies: No results found.   Marzetta Board, MD, PhD [facility]  Between 7 am - 7 pm I am available, please contact me via Amion (for emergencies) or Securechat (non urgent messages)  Between 7 pm - 7 am I am not available, please contact night coverage MD/APP via Amion

## 2022-10-12 NOTE — TOC Progression Note (Addendum)
Transition of Care [facility]) - Progression Note    Patient Details  Name: [name] MRN: [id number] Date of Birth: December 06, 1940  Transition of Care [facility]) CM/SW Contact  Joanne Chars, LCSW Phone Number: 10/12/2022, 11:11 AM  Clinical Narrative:   Bed offers and choice document presented to pt.  Pt daughter [name] called and joined conversation.  Discussed that [facility] is not an option.  [name] wants to visit [facility].  Also asking for response from [facility].    CSW reached out to [facility].  1310: CSW sent message to daughter with bed offers from [facility] and [facility].  Expected Discharge Plan: [place] Barriers to Discharge: Continued Medical Work up, SNF Pending bed offer  Expected Discharge Plan and Services Expected Discharge Plan: [place] Choice: [place] arrangements for the past 2 months: Single Family Home                                       Social Determinants of Health (SDOH) Interventions Housing Interventions: Patient Refused  Readmission Risk Interventions     No data to display

## 2022-10-12 NOTE — Care Management Important Message (Signed)
Important Message  Patient Details  Name: [name] MRN: [id number] Date of Birth: 1941-07-23   Medicare Important Message Given:  Yes     Hannah Beat 10/12/2022, 1:13 PM

## 2022-10-12 NOTE — Plan of Care (Signed)
  Problem: Education: Goal: Knowledge of General Education information will improve Description: Including pain rating scale, medication(s)/side effects and non-pharmacologic comfort measures Outcome: Progressing   Problem: Clinical Measurements: Goal: Cardiovascular complication will be avoided Outcome: Progressing   Problem: Nutrition: Goal: Adequate nutrition will be maintained Outcome: Progressing   Problem: Elimination: Goal: Will not experience complications related to bowel motility Outcome: Progressing Goal: Will not experience complications related to urinary retention Outcome: Progressing   Problem: Safety: Goal: Ability to remain free from injury will improve Outcome: Progressing   Problem: Education: Goal: Knowledge of the prescribed therapeutic regimen will improve Outcome: Progressing   Problem: Clinical Measurements: Goal: Postoperative complications will be avoided or minimized Outcome: Progressing   Problem: Pain Management: Goal: Pain level will decrease with appropriate interventions Outcome: Progressing   Problem: Skin Integrity: Goal: Will show signs of wound healing Outcome: Progressing

## 2022-10-13 DIAGNOSIS — N189 Chronic kidney disease, unspecified: Secondary | ICD-10-CM | POA: Diagnosis not present

## 2022-10-13 DIAGNOSIS — F32A Depression, unspecified: Secondary | ICD-10-CM | POA: Diagnosis not present

## 2022-10-13 DIAGNOSIS — Z743 Need for continuous supervision: Secondary | ICD-10-CM | POA: Diagnosis not present

## 2022-10-13 DIAGNOSIS — E876 Hypokalemia: Secondary | ICD-10-CM | POA: Diagnosis not present

## 2022-10-13 DIAGNOSIS — K5900 Constipation, unspecified: Secondary | ICD-10-CM | POA: Diagnosis not present

## 2022-10-13 DIAGNOSIS — Z96641 Presence of right artificial hip joint: Secondary | ICD-10-CM | POA: Diagnosis not present

## 2022-10-13 DIAGNOSIS — R5381 Other malaise: Secondary | ICD-10-CM | POA: Diagnosis not present

## 2022-10-13 DIAGNOSIS — Z4789 Encounter for other orthopedic aftercare: Secondary | ICD-10-CM | POA: Diagnosis not present

## 2022-10-13 DIAGNOSIS — D638 Anemia in other chronic diseases classified elsewhere: Secondary | ICD-10-CM | POA: Diagnosis not present

## 2022-10-13 DIAGNOSIS — Z471 Aftercare following joint replacement surgery: Secondary | ICD-10-CM | POA: Diagnosis not present

## 2022-10-13 DIAGNOSIS — S7291XD Unspecified fracture of right femur, subsequent encounter for closed fracture with routine healing: Secondary | ICD-10-CM | POA: Diagnosis not present

## 2022-10-13 DIAGNOSIS — N1831 Chronic kidney disease, stage 3a: Secondary | ICD-10-CM | POA: Diagnosis not present

## 2022-10-13 DIAGNOSIS — I10 Essential (primary) hypertension: Secondary | ICD-10-CM | POA: Diagnosis not present

## 2022-10-13 DIAGNOSIS — D72829 Elevated white blood cell count, unspecified: Secondary | ICD-10-CM | POA: Diagnosis not present

## 2022-10-13 DIAGNOSIS — M8430XA Stress fracture, unspecified site, initial encounter for fracture: Secondary | ICD-10-CM | POA: Diagnosis not present

## 2022-10-13 DIAGNOSIS — M25551 Pain in right hip: Secondary | ICD-10-CM | POA: Diagnosis not present

## 2022-10-13 DIAGNOSIS — S72041A Displaced fracture of base of neck of right femur, initial encounter for closed fracture: Secondary | ICD-10-CM | POA: Diagnosis not present

## 2022-10-13 DIAGNOSIS — K219 Gastro-esophageal reflux disease without esophagitis: Secondary | ICD-10-CM | POA: Diagnosis not present

## 2022-10-13 DIAGNOSIS — S72041D Displaced fracture of base of neck of right femur, subsequent encounter for closed fracture with routine healing: Secondary | ICD-10-CM | POA: Diagnosis not present

## 2022-10-13 DIAGNOSIS — I639 Cerebral infarction, unspecified: Secondary | ICD-10-CM | POA: Diagnosis not present

## 2022-10-13 DIAGNOSIS — I4581 Long QT syndrome: Secondary | ICD-10-CM | POA: Diagnosis not present

## 2022-10-13 NOTE — Plan of Care (Signed)
Problem: Education: Goal: Knowledge of General Education information will improve Description: Including pain rating scale, medication(s)/side effects and non-pharmacologic comfort measures 10/13/2022 1501 by Oris Drone L, LPN Outcome: Adequate for Discharge 10/13/2022 1418 by Pierson Vantol L, LPN Outcome: Progressing 10/13/2022 1417 by Eustace Quail, LPN Outcome: Progressing   Problem: Health Behavior/Discharge Planning: Goal: Ability to manage health-related needs will improve 10/13/2022 1501 by Ojani Berenson L, LPN Outcome: Adequate for Discharge 10/13/2022 1418 by Teryl Mcconaghy L, LPN Outcome: Progressing 10/13/2022 1417 by Eustace Quail, LPN Outcome: Progressing   Problem: Clinical Measurements: Goal: Ability to maintain clinical measurements within normal limits will improve 10/13/2022 1501 by Euline Kimbler L, LPN Outcome: Adequate for Discharge 10/13/2022 1418 by Ata Pecha, Doreene Adas L, LPN Outcome: Progressing 10/13/2022 1417 by Oris Drone L, LPN Outcome: Progressing Goal: Will remain free from infection 10/13/2022 1501 by Oris Drone L, LPN Outcome: Adequate for Discharge 10/13/2022 1418 by Oris Drone L, LPN Outcome: Progressing 10/13/2022 1417 by Oris Drone L, LPN Outcome: Progressing Goal: Diagnostic test results will improve 10/13/2022 1501 by Oris Drone L, LPN Outcome: Adequate for Discharge 10/13/2022 1417 by Oris Drone L, LPN Outcome: Progressing Goal: Respiratory complications will improve 10/13/2022 1501 by Oris Drone L, LPN Outcome: Adequate for Discharge 10/13/2022 1418 by Oris Drone L, LPN Outcome: Progressing 10/13/2022 1417 by Oris Drone L, LPN Outcome: Progressing Goal: Cardiovascular complication will be avoided 10/13/2022 1501 by Oris Drone L, LPN Outcome: Adequate for Discharge 10/13/2022 1418 by Council Munguia, Doreene Adas L, LPN Outcome: Progressing 10/13/2022 1417  by Eustace Quail, LPN Outcome: Progressing   Problem: Activity: Goal: Risk for activity intolerance will decrease 10/13/2022 1501 by Oris Drone L, LPN Outcome: Adequate for Discharge 10/13/2022 1418 by Bryant Lipps L, LPN Outcome: Progressing   Problem: Nutrition: Goal: Adequate nutrition will be maintained 10/13/2022 1501 by Oris Drone L, LPN Outcome: Adequate for Discharge 10/13/2022 1418 by Joevon Holliman, Doreene Adas L, LPN Outcome: Progressing 10/13/2022 1417 by Oris Drone L, LPN Outcome: Progressing   Problem: Coping: Goal: Level of anxiety will decrease 10/13/2022 1501 by Oris Drone L, LPN Outcome: Adequate for Discharge 10/13/2022 1418 by Casmer Yepiz, Doreene Adas L, LPN Outcome: Progressing 10/13/2022 1417 by Eustace Quail, LPN Outcome: Progressing   Problem: Elimination: Goal: Will not experience complications related to bowel motility 10/13/2022 1501 by Oris Drone L, LPN Outcome: Adequate for Discharge 10/13/2022 1418 by Judee Hennick, Doreene Adas L, LPN Outcome: Progressing 10/13/2022 1417 by Oris Drone L, LPN Outcome: Progressing   Problem: Pain Managment: Goal: General experience of comfort will improve 10/13/2022 1501 by Oris Drone L, LPN Outcome: Adequate for Discharge 10/13/2022 1418 by Rufina Kimery, Doreene Adas L, LPN Outcome: Progressing 10/13/2022 1417 by Oris Drone L, LPN Outcome: Progressing   Problem: Safety: Goal: Ability to remain free from injury will improve 10/13/2022 1501 by Oris Drone L, LPN Outcome: Adequate for Discharge 10/13/2022 1418 by Jalayla Chrismer, Doreene Adas L, LPN Outcome: Progressing 10/13/2022 1417 by Oris Drone L, LPN Outcome: Progressing   Problem: Skin Integrity: Goal: Risk for impaired skin integrity will decrease 10/13/2022 1501 by Oris Drone L, LPN Outcome: Adequate for Discharge 10/13/2022 1418 by Ninetta Adelstein L, LPN Outcome: Progressing   Problem: Education: Goal:  Knowledge of the prescribed therapeutic regimen will improve 10/13/2022 1501 by Oris Drone L, LPN Outcome: Adequate for Discharge 10/13/2022 1418 by Benno Brensinger L, LPN Outcome: Progressing Goal: Understanding of discharge needs will improve Outcome: Adequate for Discharge Goal: Individualized Educational Video(s) Outcome: Adequate for Discharge   Problem: Activity: Goal: Ability to avoid complications of mobility  impairment will improve 10/13/2022 1501 by Denaja Verhoeven L, LPN Outcome: Adequate for Discharge 10/13/2022 1418 by Glorie Dowlen L, LPN Outcome: Progressing Goal: Ability to tolerate increased activity will improve 10/13/2022 1501 by Kaoru Rezendes L, LPN Outcome: Adequate for Discharge 10/13/2022 1418 by Nery Kalisz L, LPN Outcome: Progressing   Problem: Clinical Measurements: Goal: Postoperative complications will be avoided or minimized 10/13/2022 1501 by Lewayne Pauley L, LPN Outcome: Adequate for Discharge 10/13/2022 1418 by Elese Rane L, LPN Outcome: Progressing   Problem: Pain Management: Goal: Pain level will decrease with appropriate interventions 10/13/2022 1501 by Tiffinie Caillier L, LPN Outcome: Adequate for Discharge 10/13/2022 1418 by Marnette Perkins L, LPN Outcome: Progressing   Problem: Skin Integrity: Goal: Will show signs of wound healing 10/13/2022 1501 by Oris Drone L, LPN Outcome: Adequate for Discharge 10/13/2022 1418 by Taitum Menton L, LPN Outcome: Progressing

## 2022-10-13 NOTE — Progress Notes (Signed)
Physical Therapy Treatment Patient Details Name: [name] MRN: [id number] DOB: 1941/06/01 Today's Date: 10/13/2022   History of Present Illness Patient is 81 y.o. female s/p Rt THA anterior approach on 10/09/22 secondary to mechanical fall resulting in femoral neck fracture. PMH significant for OA, CVA, GERD, HTN, depression.    PT Comments    Pt received in supine, agreeable to therapy session with encouragement, emphasis on safety with transfers and gait training/RW safety. Pt needing up to minA for transfers with dense safety cues and up to  [facility] for bed mobility. Pt unable to progress to typical household distance gait trial due to c/o BUE fatigue but able to perform short distances in room at bedside with minA. Pt continues to benefit from PT services to progress toward functional mobility goals.   Recommendations for follow up therapy are one component of a multi-disciplinary discharge planning process, led by the attending physician.  Recommendations may be updated based on patient status, additional functional criteria and insurance authorization.  Follow Up Recommendations  Skilled nursing-short term rehab (<3 hours/day) Can patient physically be transported by private vehicle: No   Assistance Recommended at Discharge Frequent or constant Supervision/Assistance  Patient can return home with the following Two people to help with walking and/or transfers;Two people to help with bathing/dressing/bathroom;Assistance with cooking/housework;Direct supervision/assist for medications management;Assist for transportation;Help with stairs or ramp for entrance   Equipment Recommendations  None recommended by PT    Recommendations for Other Services OT consult     Precautions / Restrictions Precautions Precautions: Fall Restrictions Weight Bearing Restrictions: Yes RLE Weight Bearing: Weight bearing as tolerated     Mobility  Bed Mobility Overal bed mobility: Needs  Assistance Bed Mobility: Supine to Sit, Sit to Supine     Supine to sit: HOB elevated, Min assist Sit to supine: Mod assist, HOB elevated   General bed mobility comments: Pt instructed on using gait belt to guide her RLE to EOB with good carryover; pt needing increased assist to lift RLE back over edge of bed.    Transfers Overall transfer level: Needs assistance Equipment used: Rolling walker (2 wheels) Transfers: Sit to/from Stand Sit to Stand: From elevated surface, Min assist           General transfer comment: cues for hand placement, pt with poor carryover of hand placement cues between sessions; pt asking for rationale and given multiple visual/verbal demos for safety and seemed surprised although in prior sessions she had pushed with one arm from RW and one on lower surface.    Ambulation/Gait Ambulation/Gait assistance: Min assist Gait Distance (Feet): 18 Feet (63f, seated break, 146f Assistive device: Rolling walker (2 wheels) Gait Pattern/deviations: Step-to pattern, Decreased stride length, Decreased weight shift to right, Decreased step length - left, Decreased step length - right, Decreased dorsiflexion - right, Shuffle Gait velocity: decr     General Gait Details: Cues for step pattern and sequencing with RW; pt tending to shuffle, fatigued after ~1866fnd needed seated break at EOB. x2 trials   Stairs             Wheelchair Mobility    Modified Rankin (Stroke Patients Only)       Balance Overall balance assessment: Needs assistance Sitting-balance support: Feet supported, No upper extremity supported, Bilateral upper extremity supported Sitting balance-Leahy Scale: Fair     Standing balance support: During functional activity, Reliant on assistive device for balance, Bilateral upper extremity supported Standing balance-Leahy Scale: Poor Standing balance comment: heavy  reliance on RW support                            Cognition  Arousal/Alertness: Awake/alert Behavior During Therapy: WFL for tasks assessed/performed Overall Cognitive Status: Within Functional Limits for tasks assessed                                 General Comments: mild word finding difficulties, (very subtle), pt reports it has much improved since prior CVA; tangential, needs redirection to task        Exercises Total Joint Exercises Ankle Circles/Pumps: AROM, Both, 20 reps Heel Slides: AROM, AAROM, Right, 5 reps, Supine Hip ABduction/ADduction: AROM, Right, 5 reps, Supine Long Arc Quad: AROM, Right, 10 reps, Seated Knee Flexion: AROM, Right, 10 reps, Seated    General Comments General comments (skin integrity, edema, etc.): VSS (BP slightly elevated but no c/o headache)      Pertinent Vitals/Pain Pain Assessment Pain Assessment: Faces Faces Pain Scale: Hurts little more Pain Location: Rt hip Pain Descriptors / Indicators: Aching, Discomfort, Guarding, Sore Pain Intervention(s): Monitored during session, Repositioned, Premedicated before session (pt defers ice pack)    Home Living                          Prior Function            PT Goals (current goals can now be found in the care plan section) Acute Rehab PT Goals Patient Stated Goal: less pain, to be more independent getting around PT Goal Formulation: With patient Time For Goal Achievement: 10/24/22 Progress towards PT goals: Progressing toward goals    Frequency    Min 3X/week      PT Plan Current plan remains appropriate    Co-evaluation              AM-PAC PT "6 Clicks" Mobility   Outcome Measure  Help needed turning from your back to your side while in a flat bed without using bedrails?: A Little Help needed moving from lying on your back to sitting on the side of a flat bed without using bedrails?: A Lot Help needed moving to and from a bed to a chair (including a wheelchair)?: A Little Help needed standing up from a  chair using your arms (e.g., wheelchair or bedside chair)?: A Little Help needed to walk in hospital room?: A Lot (mod cues for safe technique) Help needed climbing 3-5 steps with a railing? : Total 6 Click Score: 14    End of Session Equipment Utilized During Treatment: Gait belt Activity Tolerance: Patient tolerated treatment well Patient left: in bed;with call bell/phone within reach;with bed alarm set;Other (comment) (LCSW entering room to speak with the patient) Nurse Communication: Mobility status PT Visit Diagnosis: Muscle weakness (generalized) (M62.81);Difficulty in walking, not elsewhere classified (R26.2);Pain Pain - Right/Left: Right Pain - part of body: Hip     Time: 3662-9476 PT Time Calculation (min) (ACUTE ONLY): 29 min  Charges:  $Gait Training: 8-22 mins $Therapeutic Activity: 8-22 mins                     Blaine Guiffre P., PTA Acute Rehabilitation Services Secure Chat Preferred 9a-5:30pm Office: [address] 10/13/2022, 4:17 PM

## 2022-10-13 NOTE — TOC Transition Note (Signed)
Transition of Care [facility]) - CM/SW Discharge Note   Patient Details  Name: [name] MRN: [id number] Date of Birth: Jun 15, 1941  [facility]) CM/SW Contact:  Joanne Chars, LCSW Phone Number: 10/13/2022, 2:57 PM   Clinical Narrative:   [facility]  RN call report to [phone].    Final next level of care: Skilled Nursing Facility Barriers to Discharge: Barriers Resolved   Patient Goals and CMS Choice Patient states their goals for this hospitalization and ongoing recovery are:: return home CMS Medicare.gov Compare Post Acute Care list provided to:: Patient Choice offered to / list presented to : Patient  Discharge Placement              [facility] bed at[facility]) Patient to be transferred to facility by: [place] Name of family member notified: daughter Raquel Sarna Patient and family notified of of transfer: 10/13/22  Discharge Plan and Services     Post Acute Care Choice: [place]                               Social Determinants of Health (SDOH) Interventions Housing Interventions: Patient Refused   Readmission Risk Interventions     No data to display

## 2022-10-13 NOTE — TOC Progression Note (Signed)
Transition of Care [facility]) - Progression Note    Patient Details  Name: VALTA DILLON MRN: [id number] Date of Birth: 12/09/1940  Transition of Care [facility]) CM/SW Contact  Joanne Chars, LCSW Phone Number: 10/13/2022, 2:46 PM  Clinical Narrative:   CSW met with pt and daughter.  Daughter was not able to [facility] but she agreed to go over right now and if she does not choose [facility], will go with [place].  1330: TC daughter.  No bed at [facility], she is checking out [facility].  No referral had been sent, CSW sent referral, [facility] able to offer bed.  1440: TC daughter and pt.  They accept offer at [facility].  CSW confirmed with [facility] they can accept pt today.    Expected Discharge Plan: [place] Barriers to Discharge: Continued Medical Work up, SNF Pending bed offer  Expected Discharge Plan and Services Expected Discharge Plan: [place] Choice: [place] arrangements for the past 2 months: Single Family Home Expected Discharge Date: 10/13/22                                     Social Determinants of Health (SDOH) Interventions Housing Interventions: Patient Refused  Readmission Risk Interventions     No data to display

## 2022-10-13 NOTE — Plan of Care (Signed)
Problem: Education: Goal: Knowledge of General Education information will improve Description: Including pain rating scale, medication(s)/side effects and non-pharmacologic comfort measures Outcome: Progressing   Problem: Health Behavior/Discharge Planning: Goal: Ability to manage health-related needs will improve Outcome: Progressing   Problem: Clinical Measurements: Goal: Ability to maintain clinical measurements within normal limits will improve Outcome: Progressing Goal: Will remain free from infection Outcome: Progressing Goal: Diagnostic test results will improve Outcome: Progressing Goal: Respiratory complications will improve Outcome: Progressing Goal: Cardiovascular complication will be avoided Outcome: Progressing   Problem: Nutrition: Goal: Adequate nutrition will be maintained Outcome: Progressing   Problem: Coping: Goal: Level of anxiety will decrease Outcome: Progressing   Problem: Elimination: Goal: Will not experience complications related to bowel motility Outcome: Progressing   Problem: Pain Managment: Goal: General experience of comfort will improve Outcome: Progressing   Problem: Safety: Goal: Ability to remain free from injury will improve Outcome: Progressing   Problem: Education: Goal: Knowledge of General Education information will improve Description: Including pain rating scale, medication(s)/side effects and non-pharmacologic comfort measures 10/13/2022 1418 by Caydence Enck L, LPN Outcome: Progressing 10/13/2022 1417 by Melanny Wire L, LPN Outcome: Progressing   Problem: Health Behavior/Discharge Planning: Goal: Ability to manage health-related needs will improve 10/13/2022 1418 by Sujey Gundry L, LPN Outcome: Progressing 10/13/2022 1417 by Oris Drone L, LPN Outcome: Progressing   Problem: Clinical Measurements: Goal: Ability to maintain clinical measurements within normal limits will improve 10/13/2022 1418 by  Kalena Mander L, LPN Outcome: Progressing 10/13/2022 1417 by Oris Drone L, LPN Outcome: Progressing Goal: Will remain free from infection 10/13/2022 1418 by Katonya Blecher L, LPN Outcome: Progressing 10/13/2022 1417 by Oris Drone L, LPN Outcome: Progressing Goal: Diagnostic test results will improve Outcome: Progressing Goal: Respiratory complications will improve 10/13/2022 1418 by Edie Darley L, LPN Outcome: Progressing 10/13/2022 1417 by Oris Drone L, LPN Outcome: Progressing Goal: Cardiovascular complication will be avoided 10/13/2022 1418 by Rayhaan Huster L, LPN Outcome: Progressing 10/13/2022 1417 by Eustace Quail, LPN Outcome: Progressing   Problem: Activity: Goal: Risk for activity intolerance will decrease Outcome: Progressing   Problem: Nutrition: Goal: Adequate nutrition will be maintained 10/13/2022 1418 by Lise Pincus L, LPN Outcome: Progressing 10/13/2022 1417 by Oris Drone L, LPN Outcome: Progressing   Problem: Coping: Goal: Level of anxiety will decrease 10/13/2022 1418 by Blenda Wisecup L, LPN Outcome: Progressing 10/13/2022 1417 by Oris Drone L, LPN Outcome: Progressing   Problem: Elimination: Goal: Will not experience complications related to bowel motility 10/13/2022 1418 by Areli Jowett L, LPN Outcome: Progressing 10/13/2022 1417 by Oris Drone L, LPN Outcome: Progressing   Problem: Pain Managment: Goal: General experience of comfort will improve 10/13/2022 1418 by Kaleigha Chamberlin L, LPN Outcome: Progressing 10/13/2022 1417 by Oris Drone L, LPN Outcome: Progressing   Problem: Safety: Goal: Ability to remain free from injury will improve 10/13/2022 1418 by Eduardo Wurth L, LPN Outcome: Progressing 10/13/2022 1417 by Lorrane Mccay L, LPN Outcome: Progressing   Problem: Skin Integrity: Goal: Risk for impaired skin integrity will decrease Outcome: Progressing    Problem: Education: Goal: Knowledge of the prescribed therapeutic regimen will improve Outcome: Progressing   Problem: Activity: Goal: Ability to avoid complications of mobility impairment will improve Outcome: Progressing Goal: Ability to tolerate increased activity will improve Outcome: Progressing   Problem: Clinical Measurements: Goal: Postoperative complications will be avoided or minimized Outcome: Progressing   Problem: Pain Management: Goal: Pain level will decrease with appropriate interventions Outcome: Progressing   Problem: Skin Integrity: Goal: Will show signs  of wound healing Outcome: Progressing

## 2022-10-14 DIAGNOSIS — K219 Gastro-esophageal reflux disease without esophagitis: Secondary | ICD-10-CM | POA: Diagnosis not present

## 2022-10-14 DIAGNOSIS — I639 Cerebral infarction, unspecified: Secondary | ICD-10-CM | POA: Diagnosis not present

## 2022-10-14 DIAGNOSIS — F32A Depression, unspecified: Secondary | ICD-10-CM | POA: Diagnosis not present

## 2022-10-14 DIAGNOSIS — N189 Chronic kidney disease, unspecified: Secondary | ICD-10-CM | POA: Diagnosis not present

## 2022-10-16 DIAGNOSIS — I639 Cerebral infarction, unspecified: Secondary | ICD-10-CM | POA: Diagnosis not present

## 2022-10-16 DIAGNOSIS — I10 Essential (primary) hypertension: Secondary | ICD-10-CM | POA: Diagnosis not present

## 2022-10-16 DIAGNOSIS — Z471 Aftercare following joint replacement surgery: Secondary | ICD-10-CM | POA: Diagnosis not present

## 2022-10-16 DIAGNOSIS — N1831 Chronic kidney disease, stage 3a: Secondary | ICD-10-CM | POA: Diagnosis not present

## 2022-10-16 DIAGNOSIS — D638 Anemia in other chronic diseases classified elsewhere: Secondary | ICD-10-CM | POA: Diagnosis not present

## 2022-10-16 DIAGNOSIS — F32A Depression, unspecified: Secondary | ICD-10-CM | POA: Diagnosis not present

## 2022-10-16 DIAGNOSIS — I4581 Long QT syndrome: Secondary | ICD-10-CM | POA: Diagnosis not present

## 2022-10-16 DIAGNOSIS — Z4789 Encounter for other orthopedic aftercare: Secondary | ICD-10-CM | POA: Diagnosis not present

## 2022-10-16 DIAGNOSIS — S72041D Displaced fracture of base of neck of right femur, subsequent encounter for closed fracture with routine healing: Secondary | ICD-10-CM | POA: Diagnosis not present

## 2022-10-16 DIAGNOSIS — S7291XD Unspecified fracture of right femur, subsequent encounter for closed fracture with routine healing: Secondary | ICD-10-CM | POA: Diagnosis not present

## 2022-10-16 DIAGNOSIS — E876 Hypokalemia: Secondary | ICD-10-CM | POA: Diagnosis not present

## 2022-10-16 DIAGNOSIS — Z96641 Presence of right artificial hip joint: Secondary | ICD-10-CM | POA: Diagnosis not present

## 2022-10-16 DIAGNOSIS — M25551 Pain in right hip: Secondary | ICD-10-CM | POA: Diagnosis not present

## 2022-10-16 DIAGNOSIS — D72829 Elevated white blood cell count, unspecified: Secondary | ICD-10-CM | POA: Diagnosis not present

## 2022-10-22 DIAGNOSIS — I639 Cerebral infarction, unspecified: Secondary | ICD-10-CM | POA: Diagnosis not present

## 2022-10-22 DIAGNOSIS — K5900 Constipation, unspecified: Secondary | ICD-10-CM | POA: Diagnosis not present

## 2022-10-22 DIAGNOSIS — K219 Gastro-esophageal reflux disease without esophagitis: Secondary | ICD-10-CM | POA: Diagnosis not present

## 2022-10-22 DIAGNOSIS — N189 Chronic kidney disease, unspecified: Secondary | ICD-10-CM | POA: Diagnosis not present

## 2022-10-28 ENCOUNTER — Ambulatory Visit (INDEPENDENT_AMBULATORY_CARE_PROVIDER_SITE_OTHER): Payer: Medicare Other | Admitting: Orthopaedic Surgery

## 2022-10-28 ENCOUNTER — Encounter: Payer: Self-pay | Admitting: Orthopaedic Surgery

## 2022-10-28 DIAGNOSIS — Z96641 Presence of right artificial hip joint: Secondary | ICD-10-CM | POA: Insufficient documentation

## 2022-10-28 NOTE — Progress Notes (Signed)
This is a first postoperative visit for this patient status post a right total hip arthroplasty to treat her displaced right hip femoral neck fracture.  She is still recovering at a skilled nursing facility.  She lives at home before this.  She feels like she is getting there in terms of being ready to go back home.  She did live alone with family nearby.  She has been on a baby aspirin twice daily for DVT coverage.  She denies any calf pain.  There is no significant foot and ankle swelling either.  She was not on aspirin before surgery.  Her right hip incision looks good.  The staples are removed and Steri-Strips applied.  Her leg lengths are equal.  We talked in length in detail about her surgery.  Therapy should still work on her balance and coordination given her fall risk.  She can stop the aspirin completely and I did fill out this on her intake form for the skilled nursing facility let them know.  From my standpoint we will see her back in a month to see how she is doing overall but no x-rays are needed.  Once she is home, she does need home health therapy for continued balance coordination.

## 2022-11-10 DIAGNOSIS — I639 Cerebral infarction, unspecified: Secondary | ICD-10-CM | POA: Diagnosis not present

## 2022-11-10 DIAGNOSIS — R5381 Other malaise: Secondary | ICD-10-CM | POA: Diagnosis not present

## 2022-11-10 DIAGNOSIS — K5900 Constipation, unspecified: Secondary | ICD-10-CM | POA: Diagnosis not present

## 2022-11-10 DIAGNOSIS — N189 Chronic kidney disease, unspecified: Secondary | ICD-10-CM | POA: Diagnosis not present

## 2022-11-10 DIAGNOSIS — K219 Gastro-esophageal reflux disease without esophagitis: Secondary | ICD-10-CM | POA: Diagnosis not present

## 2022-11-18 DIAGNOSIS — J209 Acute bronchitis, unspecified: Secondary | ICD-10-CM | POA: Diagnosis not present

## 2022-11-25 ENCOUNTER — Encounter: Payer: Self-pay | Admitting: Orthopaedic Surgery

## 2022-11-25 ENCOUNTER — Other Ambulatory Visit: Payer: Self-pay

## 2022-11-25 ENCOUNTER — Ambulatory Visit (INDEPENDENT_AMBULATORY_CARE_PROVIDER_SITE_OTHER): Payer: Medicare Other | Admitting: Orthopaedic Surgery

## 2022-11-25 DIAGNOSIS — Z96641 Presence of right artificial hip joint: Secondary | ICD-10-CM

## 2022-11-25 NOTE — Progress Notes (Signed)
The patient is now 6 weeks status post right hip surgery to treat a femoral neck fracture.  We performed a direct anterior total hip arthroplasty.  She is 82 years old.  Prior to surgery she had had some outpatient physical therapy due to balance and coordination issues from a previous stroke.  She then unfortunately had a mechanical fall sustaining a hip fracture.  She is ambulating with a walker here in the office today but she uses a cane more often at home.  Sometimes she does not use assistive device in her home because of her comfort level with mobility.  On exam her right operative hip has just some slight stiffness with rotation but overall she looks good.  She would definitely benefit from outpatient physical therapy given her fall risk and previous deconditioning from her stroke.  Physical therapy to work on bilateral lower extremity strengthening as well as her balance and coordination.  We will see her back in 6 weeks.  At that visit I would like just a standing AP pelvis.

## 2022-12-15 DIAGNOSIS — Z96641 Presence of right artificial hip joint: Secondary | ICD-10-CM | POA: Diagnosis not present

## 2022-12-15 DIAGNOSIS — Z780 Asymptomatic menopausal state: Secondary | ICD-10-CM | POA: Diagnosis not present

## 2022-12-15 DIAGNOSIS — R058 Other specified cough: Secondary | ICD-10-CM | POA: Diagnosis not present

## 2022-12-16 ENCOUNTER — Other Ambulatory Visit: Payer: Self-pay | Admitting: Internal Medicine

## 2022-12-16 DIAGNOSIS — Z780 Asymptomatic menopausal state: Secondary | ICD-10-CM

## 2023-01-06 ENCOUNTER — Other Ambulatory Visit: Payer: Self-pay

## 2023-01-06 ENCOUNTER — Encounter: Payer: Self-pay | Admitting: Orthopaedic Surgery

## 2023-01-06 ENCOUNTER — Ambulatory Visit (INDEPENDENT_AMBULATORY_CARE_PROVIDER_SITE_OTHER): Payer: Medicare Other

## 2023-01-06 ENCOUNTER — Ambulatory Visit (INDEPENDENT_AMBULATORY_CARE_PROVIDER_SITE_OTHER): Payer: Medicare Other | Admitting: Orthopaedic Surgery

## 2023-01-06 DIAGNOSIS — Z96641 Presence of right artificial hip joint: Secondary | ICD-10-CM

## 2023-01-06 NOTE — Progress Notes (Signed)
The patient is a pleasant 82 year old female who sustained a mechanical fall and a fracture of her right hip back on Thanksgiving.  She underwent a right direct anterior hip replacement and has done well from that standpoint.  She does ambulate the cane.  Her biggest issue has been balance and coordination problems that were happening even before her mechanical fall.  We had wanted to set her up for outpatient physical therapy for lower extremity strengthening and balance and coordination but somehow that did not get ordered appropriately.  At this point she is still in need of outpatient physical therapy to work on her balance and coordination.  She says her hip is doing well.  On examination her right operative hip moves smoothly and fluidly with no issues at all.  She does have weakness in her legs in general.  An AP pelvis and lateral of the right hip shows a well-seated total hip arthroplasty with no complicating features.  From my standpoint I do feel that outpatient therapy could help with her balance and coordination in bilateral lower extremity strengthening.  We will work on getting that scheduled and then we can see her back in 6 weeks to see if that is doing well for her.

## 2023-01-18 NOTE — Therapy (Unsigned)
OUTPATIENT PHYSICAL THERAPY LOWER EXTREMITY EVALUATION   Patient Name: [name] MRN: [id number] DOB:February 25, 1941, 82 y.o., female Today's Date: 01/19/2023  END OF SESSION:  PT End of Session - 01/19/23 1129     Visit Number 1    Number of Visits 16    Date for PT Re-Evaluation 03/23/23    Authorization Type MEDICARE PART A AND B    Progress Note Due on Visit 10    PT Start Time 1108    PT Stop Time 1138    PT Time Calculation (min) 30 min    Activity Tolerance Patient tolerated treatment well    Behavior During Therapy WFL for tasks assessed/performed             Past Medical History:  Diagnosis Date   Arthritis    CVA (cerebral vascular accident) ([place])    Depression    GERD (gastroesophageal reflux disease)    Hypertension    Past Surgical History:  Procedure Laterality Date   CHOLECYSTECTOMY N/A 08/28/2015   Procedure: LAPAROSCOPIC CHOLECYSTECTOMY;  Surgeon: Fanny Skates, MD;  Location: [facility];  Service: General;  Laterality: N/A;   HIATAL HERNIA REPAIR     TOTAL HIP ARTHROPLASTY Right 10/09/2022   Procedure: TOTAL HIP ARTHROPLASTY ANTERIOR APPROACH;  Surgeon: Mcarthur Rossetti, MD;  Location: [facility];  Service: Orthopedics;  Laterality: Right;   WISDOM TOOTH EXTRACTION     Patient Active Problem List   Diagnosis Date Noted   Status post total replacement of right hip 10/28/2022   Hip fracture ([place]) 10/09/2022   Leukocytosis 10/09/2022   Hypokalemia 10/09/2022   QT prolongation 10/09/2022   History of stroke 10/09/2022   Essential hypertension 10/09/2022   Depression 10/09/2022   Basicervical fracture of neck of right femur ([place]) 10/09/2022   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 She denies any pain in her hip, but reports difficulty with walking and balance.   PERTINENT HISTORY: Arthritis, CVA, Depression, GERD, HTN PAIN:  Are you having pain? No  PRECAUTIONS: Anterior hip and Fall  WEIGHT BEARING RESTRICTIONS: No  FALLS:  Has patient fallen in last 6 months? Yes. Number of falls 1x since surgery, but once a week prior to sx.   LIVING ENVIRONMENT: Lives with: lives alone Lives in: House/apartment Stairs: No Has following equipment at home: Single point cane, Quad cane large base, Walker - 2 wheeled, shower chair, and Grab bars  OCCUPATION: Retired   PLOF: [facility]  PATIENT GOALS: Pt would like to improve gait, balance and cardiovascular endurance.   OBJECTIVE:   DIAGNOSTIC FINDINGS: Expected changes post right total hip arthroplasty.   PATIENT SURVEYS:  FOTO 55.1935%, 64% in 13 visits.   COGNITION: Overall cognitive status: Within functional limits for tasks assessed     SENSATION: WFL  POSTURE: rounded shoulders and forward head  PALPATION: No pain or tenderness  LOWER  EXTREMITY ROM:  Active ROM Right eval Left eval  Hip flexion [facility]  Hip extension    Hip abduction [facility]  Knee flexion [facility] WFL  Knee extension WFL WFL   (Blank rows = not tested)  LOWER EXTREMITY MMT:  MMT Right eval Left eval  Hip flexion 4 4+  Hip extension    Knee flexion 4 4+  Knee extension 4- 4+   (Blank rows = not tested)  FUNCTIONAL TESTS:  5 times sit to stand: 12.60 sec with definite use of hands.  Timed up and go (TUG): 18.84 sec  BERG BALANCE  TEST Sitting to Standing: 3.      Stands independently using hands Standing Unsupported: 4.      Stands safely for 2 minutes Sitting Unsupported: 4.     Sits for 2 minutes independently Standing to Sitting: 3.     Controls descent with hands  Transfers: 3.     Transfers safely definite use of hands Standing with eyes closed: 4.     Stands safely for 10 seconds  Standing with feet together: 3.     Stands for 1 minute with supervision Reaching forward with outstretched arm: 3.     Reaches forward 5 inches Retrieving object from the floor: 3.     Able to pick up with supervision Turning to look behind: 4.     Looks behind from both sides and weight shifts well Turning 360 degrees: 3.     Able to turn on one side in </= 4 seconds Place alternate foot on stool: 2.     4 steps without assistance/supervision Standing with one foot in front: 0.     Loses balance while standing/stepping Standing on one foot: 0.     Unable  Total Score: 39/56   GAIT: Distance walked: 48f  Assistive device utilized: Single point cane Level of assistance: Complete [place] Comments: slow shuffled gait pattern.    TODAY'S TREATMENT:                                                                                                                              DATE: Creating, reviewing, and completing below HEP    PATIENT EDUCATION:  Education details: Educated pt on anatomy and physiology of current symptoms, FOTO, diagnosis, prognosis, HEP,  and POC. Person educated: Patient Education method: ECustomer service managerEducation comprehension: verbalized understanding and returned demonstration  HOME EXERCISE PROGRAM: Access Code: [email or web address] URL: [email or web address] Date: 01/19/2023 Prepared by: SRudi Heap Exercises - Seated Long Arc Quad  - 2 x daily - 7 x weekly - 2 sets - 10 reps - Seated March  - 2 x daily - 7 x weekly - 2 sets - 10 reps - Seated Hip Adduction Isometrics with  Ball  - 2 x daily - 7 x weekly - 2 sets - 10 reps  ASSESSMENT:  CLINICAL IMPRESSION: Patient referred to PT for s/p R THA. She  reports no pain in her hip, but notes increased fatigue with walking and impaired balance. Pt demonstrates full functional ROM, with deficits noted with MMT and balance today. She ambulates with a shuffled gait pattern with her SPC. Patient will benefit from skilled PT to address below impairments, limitations and improve overall function.  OBJECTIVE IMPAIRMENTS: decreased activity tolerance, difficulty walking, decreased balance, decreased endurance, decreased mobility, decreased ROM, decreased strength, impaired flexibility, impaired LE use, postural dysfunction, and pain.  ACTIVITY LIMITATIONS: bending, lifting, carry, locomotion, cleaning, community activity, driving, and or occupation  PERSONAL FACTORS: Arthritis, CVA, Depression, GERD, HTN are also affecting patient's functional outcome.  REHAB POTENTIAL: Good  CLINICAL DECISION MAKING: Stable/uncomplicated  EVALUATION COMPLEXITY: Low    GOALS: Short term PT Goals Target date: 02/02/2023 Pt will be I and compliant with HEP. Baseline:  Goal status: New Pt will decrease pain by 25% overall Baseline: Goal status: New  Long term PT goals Target date: 03/23/2023  Pt will improve hip/knee strength to at least 4+/5 MMT to improve functional strength Baseline: Goal status: New Pt will improve FOTO to at least 64% functional to show improved function Baseline: Goal status: New Pt will improve BERG by at least 3 points in order to demonstrate clinically significant improvement in balance.  Baseline: Goal status: New Pt will improve TUG by 3.4 seconds or Minimal clinically important difference.  Baseline: Goal status: New Pt will be able to ambulate community distances at least 1000 ft WNL gait pattern and appropriate AD without complaints Baseline: Goal status: New Pt will deny any falls throughout  duration of PT.  Baseline: Goal status: New  PLAN: PT FREQUENCY: 1-2 times per week   PT DURATION: 6-8 weeks  PLANNED INTERVENTIONS (unless contraindicated): aquatic PT, Canalith repositioning, cryotherapy, Electrical stimulation, Iontophoresis with 4 mg/ml dexamethasome, Moist heat, traction, Ultrasound, gait training, Therapeutic exercise, balance training, neuromuscular re-education, patient/family education, prosthetic training, manual techniques, passive ROM, dry needling, taping, vasopnuematic device, vestibular, spinal manipulations, joint manipulations  PLAN FOR NEXT SESSION: Assess HEP/update PRN, continue to progress functional mobility, strengthen proximal hip muscles. Improve gait and balance.      Lynden Ang, PT 01/19/2023, 11:56 AM

## 2023-01-19 ENCOUNTER — Other Ambulatory Visit: Payer: Self-pay

## 2023-01-19 ENCOUNTER — Ambulatory Visit (INDEPENDENT_AMBULATORY_CARE_PROVIDER_SITE_OTHER): Payer: Medicare Other | Admitting: Physical Therapy

## 2023-01-19 ENCOUNTER — Encounter: Payer: Self-pay | Admitting: Physical Therapy

## 2023-01-19 DIAGNOSIS — M6281 Muscle weakness (generalized): Secondary | ICD-10-CM

## 2023-01-19 DIAGNOSIS — R262 Difficulty in walking, not elsewhere classified: Secondary | ICD-10-CM | POA: Diagnosis not present

## 2023-01-19 DIAGNOSIS — M25551 Pain in right hip: Secondary | ICD-10-CM

## 2023-01-19 DIAGNOSIS — R293 Abnormal posture: Secondary | ICD-10-CM | POA: Diagnosis not present

## 2023-01-25 NOTE — Therapy (Deleted)
OUTPATIENT PHYSICAL THERAPY LOWER EXTREMITY TREATMENT   Patient Name: [name] MRN: [id number] DOB:[birth year], 82 y.o., female Today's Date: 01/25/2023  END OF SESSION:    Past Medical History:  Diagnosis Date   Arthritis    CVA (cerebral vascular accident) ([place])    Depression    GERD (gastroesophageal reflux disease)    Hypertension    Past Surgical History:  Procedure Laterality Date   CHOLECYSTECTOMY N/A 08/28/2015   Procedure: LAPAROSCOPIC CHOLECYSTECTOMY;  Surgeon: Fanny Skates, MD;  Location: [place];  Service: General;  Laterality: N/A;   HIATAL HERNIA REPAIR     TOTAL HIP ARTHROPLASTY Right 10/09/2022   Procedure: TOTAL HIP ARTHROPLASTY ANTERIOR APPROACH;  Surgeon: Mcarthur Rossetti, MD;  Location: [facility];  Service: Orthopedics;  Laterality: Right;   WISDOM TOOTH EXTRACTION     Patient Active Problem List   Diagnosis Date Noted   Status post total replacement of right hip 10/28/2022   Hip fracture ([place]) 10/09/2022   Leukocytosis 10/09/2022   Hypokalemia 10/09/2022   QT prolongation 10/09/2022   History of stroke 10/09/2022   Essential hypertension 10/09/2022   Depression 10/09/2022   Basicervical fracture of neck of right femur ([place]) 10/09/2022   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  Rationale for Evaluation and Treatment: Rehabilitation  ONSET DATE: 10/09/2022  SUBJECTIVE:   SUBJECTIVE STATEMENT: Pt states that she had a R THA Nov last year. She denies any pain in her hip, but reports difficulty with walking and balance.   PERTINENT HISTORY: Arthritis, CVA, Depression, GERD, HTN PAIN:  Are you having pain? No  PRECAUTIONS: Anterior hip and Fall  WEIGHT BEARING RESTRICTIONS: No  FALLS:  Has patient  fallen in last 6 months? Yes. Number of falls 1x since surgery, but once a week prior to sx.   LIVING ENVIRONMENT: Lives with: lives alone Lives in: House/apartment Stairs: No Has following equipment at home: Single point cane, Quad cane large base, Walker - 2 wheeled, shower chair, and Grab bars  OCCUPATION: Retired   PLOF: [facility]  PATIENT GOALS: Pt would like to improve gait, balance and cardiovascular endurance.   OBJECTIVE:   DIAGNOSTIC FINDINGS: Expected changes post right total hip arthroplasty.   PATIENT SURVEYS:  FOTO 55.1935%, 64% in 13 visits.   COGNITION: Overall cognitive status: Within functional limits for tasks assessed     SENSATION: WFL  POSTURE: rounded shoulders and forward head  PALPATION: No pain or tenderness  LOWER EXTREMITY ROM:  Active ROM Right eval Left eval  Hip flexion [facility]  Hip extension    Hip abduction [facility]  Knee flexion [facility] WFL  Knee extension WFL WFL   (Blank rows = not tested)  LOWER EXTREMITY MMT:  MMT Right eval Left eval  Hip flexion 4 4+  Hip extension    Knee flexion 4 4+  Knee extension 4- 4+   (Blank rows = not tested)  FUNCTIONAL TESTS:  5 times sit to stand: 12.60 sec with definite use of hands.  Timed up and go (TUG): 18.84 sec  BERG BALANCE TEST Sitting to Standing: 3.      Stands independently using hands Standing Unsupported: 4.      Stands safely for 2 minutes Sitting Unsupported: 4.     Sits for 2 minutes  independently Standing to Sitting: 3.     Controls descent with hands  Transfers: 3.     Transfers safely definite use of hands Standing with eyes closed: 4.     Stands safely for 10 seconds  Standing with feet together: 3.     Stands for 1 minute with supervision Reaching forward with outstretched arm: 3.     Reaches forward 5 inches Retrieving object from the floor: 3.     Able to pick up with supervision Turning to look behind: 4.     Looks behind from both sides and weight shifts  well Turning 360 degrees: 3.     Able to turn on one side in </= 4 seconds Place alternate foot on stool: 2.     4 steps without assistance/supervision Standing with one foot in front: 0.     Loses balance while standing/stepping Standing on one foot: 0.     Unable  Total Score: 39/56   GAIT: Distance walked: 12f  Assistive device utilized: Single point cane Level of assistance: Complete [place] Comments: slow shuffled gait pattern.    TODAY'S TREATMENT:                                                                                                                              DATE: Creating, reviewing, and completing below HEP    PATIENT EDUCATION:  Education details: Educated pt on anatomy and physiology of current symptoms, FOTO, diagnosis, prognosis, HEP,  and POC. Person educated: Patient Education method: ECustomer service managerEducation comprehension: verbalized understanding and returned demonstration  HOME EXERCISE PROGRAM: Access Code: [email or web address] URL: [email or web address] Date: 01/19/2023 Prepared by: SRudi Heap Exercises - Seated Long Arc Quad  - 2 x daily - 7 x weekly - 2 sets - 10 reps - Seated March  - 2 x daily - 7 x weekly - 2 sets - 10 reps - Seated Hip Adduction Isometrics with Ball  - 2 x daily - 7 x weekly - 2 sets - 10 reps  ASSESSMENT:  CLINICAL IMPRESSION: Patient referred to PT for s/p R THA. She reports no pain in her hip, but notes increased fatigue with walking and impaired balance. Pt demonstrates full functional ROM, with deficits noted with MMT and balance today. She ambulates with a shuffled gait pattern with her SPC. Patient will benefit from skilled PT to address below impairments, limitations and improve overall function.  OBJECTIVE IMPAIRMENTS: decreased activity tolerance, difficulty walking, decreased balance, decreased endurance, decreased mobility, decreased ROM, decreased strength, impaired flexibility,  impaired LE use, postural dysfunction, and pain.  ACTIVITY LIMITATIONS: bending, lifting, carry, locomotion, cleaning, community activity, driving, and or occupation  PERSONAL FACTORS: Arthritis, CVA, Depression, GERD, HTN are also affecting patient's functional outcome.  REHAB POTENTIAL: Good  CLINICAL DECISION MAKING: Stable/uncomplicated  EVALUATION COMPLEXITY: Low    GOALS: Short term PT Goals Target date: 02/02/2023 Pt will be I and compliant with HEP. Baseline:  Goal  status: New Pt will decrease pain by 25% overall Baseline: Goal status: New  Long term PT goals Target date: 03/23/2023  Pt will improve hip/knee strength to at least 4+/5 MMT to improve functional strength Baseline: Goal status: New Pt will improve FOTO to at least 64% functional to show improved function Baseline: Goal status: New Pt will improve BERG by at least 3 points in order to demonstrate clinically significant improvement in balance.  Baseline: Goal status: New Pt will improve TUG by 3.4 seconds or Minimal clinically important difference.  Baseline: Goal status: New Pt will be able to ambulate community distances at least 1000 ft WNL gait pattern and appropriate AD without complaints Baseline: Goal status: New Pt will deny any falls throughout duration of PT.  Baseline: Goal status: New  PLAN: PT FREQUENCY: 1-2 times per week   PT DURATION: 6-8 weeks  PLANNED INTERVENTIONS (unless contraindicated): aquatic PT, Canalith repositioning, cryotherapy, Electrical stimulation, Iontophoresis with 4 mg/ml dexamethasome, Moist heat, traction, Ultrasound, gait training, Therapeutic exercise, balance training, neuromuscular re-education, patient/family education, prosthetic training, manual techniques, passive ROM, dry needling, taping, vasopnuematic device, vestibular, spinal manipulations, joint manipulations  PLAN FOR NEXT SESSION: Assess HEP/update PRN, continue to progress functional mobility,  strengthen proximal hip muscles. Improve gait and balance.      Lynden Ang, PT 01/25/2023, 10:41 AM

## 2023-01-26 ENCOUNTER — Encounter: Payer: Medicare Other | Admitting: Physical Therapy

## 2023-02-02 ENCOUNTER — Telehealth: Payer: Self-pay | Admitting: Physical Therapy

## 2023-02-02 ENCOUNTER — Encounter: Payer: Medicare Other | Admitting: Physical Therapy

## 2023-02-02 NOTE — Telephone Encounter (Signed)
I called to follow up with pt after she didn't show for her 11:45 Physical Therapy appointment on 02/02/23. There was no answer and I was unable to leave a message.  Pt's next PT appointment is next Tuesday 02/08/23 at 11:45.   Kearney Hard, PT, MPT 02/02/23 12:05 PM

## 2023-02-09 ENCOUNTER — Ambulatory Visit (INDEPENDENT_AMBULATORY_CARE_PROVIDER_SITE_OTHER): Payer: Medicare Other | Admitting: Physical Therapy

## 2023-02-09 ENCOUNTER — Encounter: Payer: Self-pay | Admitting: Physical Therapy

## 2023-02-09 DIAGNOSIS — R262 Difficulty in walking, not elsewhere classified: Secondary | ICD-10-CM | POA: Diagnosis not present

## 2023-02-09 DIAGNOSIS — M6281 Muscle weakness (generalized): Secondary | ICD-10-CM

## 2023-02-09 DIAGNOSIS — R293 Abnormal posture: Secondary | ICD-10-CM | POA: Diagnosis not present

## 2023-02-09 DIAGNOSIS — M25551 Pain in right hip: Secondary | ICD-10-CM | POA: Diagnosis not present

## 2023-02-09 NOTE — Therapy (Signed)
OUTPATIENT PHYSICAL THERAPY LOWER EXTREMITY    Patient Name: [name] MRN: [id number] DOB:03-Feb-1941, 82 y.o., female Today's Date: 02/09/2023  END OF SESSION:  PT End of Session - 02/09/23 1152     Visit Number 2    Number of Visits 16    Date for PT Re-Evaluation 03/23/23    Authorization Type MEDICARE PART A AND B    Progress Note Due on Visit 10    PT Start Time 1148    PT Stop Time 1226    PT Time Calculation (min) 38 min    Activity Tolerance Patient tolerated treatment well    Behavior During Therapy WFL for tasks assessed/performed              Past Medical History:  Diagnosis Date   Arthritis    CVA (cerebral vascular accident) ([place])    Depression    GERD (gastroesophageal reflux disease)    Hypertension    Past Surgical History:  Procedure Laterality Date   CHOLECYSTECTOMY N/A 08/28/2015   Procedure: LAPAROSCOPIC CHOLECYSTECTOMY;  Surgeon: Fanny Skates, MD;  Location: [facility];  Service: General;  Laterality: N/A;   HIATAL HERNIA REPAIR     TOTAL HIP ARTHROPLASTY Right 10/09/2022   Procedure: TOTAL HIP ARTHROPLASTY ANTERIOR APPROACH;  Surgeon: Mcarthur Rossetti, MD;  Location: [facility];  Service: Orthopedics;  Laterality: Right;   WISDOM TOOTH EXTRACTION     Patient Active Problem List   Diagnosis Date Noted   Status post total replacement of right hip 10/28/2022   Hip fracture ([place]) 10/09/2022   Leukocytosis 10/09/2022   Hypokalemia 10/09/2022   QT prolongation 10/09/2022   History of stroke 10/09/2022   Essential hypertension 10/09/2022   Depression 10/09/2022   Basicervical fracture of neck of right femur ([place]) 10/09/2022   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 Pt also reporting 2 falls while at the beach last week. Pt reporting no injuries only soreness.   PERTINENT HISTORY: Arthritis, CVA, Depression, GERD, HTN PAIN:  Are you having pain? No not at present  PRECAUTIONS: Anterior hip and Fall  WEIGHT BEARING RESTRICTIONS: No  FALLS:  Has patient fallen in last 6 months? Yes. Number of falls 1x since surgery, but once a week prior to sx.   LIVING ENVIRONMENT: Lives with: lives alone Lives in: House/apartment Stairs: No Has following equipment at home: Single point cane, Quad cane large base, Walker - 2 wheeled, shower chair, and Grab bars  OCCUPATION: Retired   PLOF: Independent  PATIENT GOALS: Pt would like to improve gait, balance and cardiovascular endurance.   OBJECTIVE:   DIAGNOSTIC FINDINGS: Expected changes post right total hip arthroplasty.   PATIENT SURVEYS:  FOTO 55.1935%, 64% in 13 visits.   COGNITION: Overall cognitive status: Within functional limits for tasks assessed     SENSATION: WFL  POSTURE: rounded shoulders and forward head  PALPATION: No pain or tenderness  LOWER EXTREMITY ROM:  Active ROM Right eval Left eval  Hip flexion [facility]  Hip extension    Hip abduction  [facility] WFL  Knee flexion [facility] WFL  Knee extension WFL WFL   (Blank rows = not tested)  LOWER EXTREMITY MMT:  MMT Right eval Left eval  Hip flexion 4 4+  Hip extension    Knee flexion 4 4+  Knee extension 4- 4+   (Blank rows = not tested)  FUNCTIONAL TESTS:  5 times sit to stand: 12.60 sec with definite use of hands.  Timed up and go (TUG): 18.84 sec  BERG BALANCE TEST Sitting to Standing: 3.      Stands independently using hands Standing Unsupported: 4.      Stands  safely for 2 minutes Sitting Unsupported: 4.     Sits for 2 minutes independently Standing to Sitting: 3.     Controls descent with hands  Transfers: 3.     Transfers safely definite use of hands Standing with eyes closed: 4.     Stands safely for 10 seconds  Standing with feet together: 3.     Stands for 1 minute with supervision Reaching forward with outstretched arm: 3.     Reaches forward 5 inches Retrieving object from the floor: 3.     Able to pick up with supervision Turning to look behind: 4.     Looks behind from both sides and weight shifts well Turning 360 degrees: 3.     Able to turn on one side in </= 4 seconds Place alternate foot on stool: 2.     4 steps without assistance/supervision Standing with one foot in front: 0.     Loses balance while standing/stepping Standing on one foot: 0.     Unable  Total Score: 39/56   GAIT: Distance walked: 61ft  Assistive device utilized: Single point cane Level of assistance: Complete Independence Comments: slow shuffled gait pattern.    TODAY'S TREATMENT:    02/09/23:  TherEx:  Supine: bridges x 10 holding 5 sec Supine SLR: x 10 each LE  Supine marching x 30  Supine ball squeezes x 15 holding 5 sec Supine clam shells x 15  Standing calf raises x 15 Standing hip abd x 15 each LE c UE support Standing marching x 20 c UE support Step ups on 4 inch step x 10 c UE support Standing on Airex mat feet apart x 30 seconds with close supervision, standing with eyes closed on Airex x 10 seconds with close supervision and increased anterior posterior sway.                                                                                                                               DATE: Creating, reviewing, and completing below HEP    PATIENT EDUCATION:  Education details: Educated pt on anatomy and physiology of current symptoms, FOTO, diagnosis, prognosis, HEP,  and POC. Person educated: Patient Education method: Holiday representative Education comprehension: verbalized understanding and returned demonstration  HOME EXERCISE PROGRAM: Access Code: [email or web address] URL: [email or web address] Date:  01/19/2023 Prepared by: Rudi Heap  Exercises - Seated Long Arc Quad  - 2 x daily - 7 x weekly - 2 sets - 10 reps - Seated March  - 2 x daily - 7 x weekly - 2 sets - 10 reps - Seated Hip Adduction Isometrics with Ball  - 2 x daily - 7 x weekly - 2 sets - 10 reps  ASSESSMENT:  CLINICAL IMPRESSION: Pt stating after walking yesterday when she was shopping. Pt also reporting 2 new falls last week while at the beach with her grandson. Pt tolerating exercises well. Continue skilled PT to maximize pt's function.   OBJECTIVE IMPAIRMENTS: decreased activity tolerance, difficulty walking, decreased balance, decreased endurance, decreased mobility, decreased ROM, decreased strength, impaired flexibility, impaired LE use, postural dysfunction, and pain.  ACTIVITY LIMITATIONS: bending, lifting, carry, locomotion, cleaning, community activity, driving, and or occupation  PERSONAL FACTORS: Arthritis, CVA, Depression, GERD, HTN are also affecting patient's functional outcome.  REHAB POTENTIAL: Good  CLINICAL DECISION MAKING: Stable/uncomplicated  EVALUATION COMPLEXITY: Low    GOALS: Short term PT Goals Target date: 02/02/2023 Pt will be I and compliant with HEP. Baseline:  Goal status: MET 02/09/23 Pt will decrease pain by 25% overall Baseline: Goal status: On-going 02/09/23  Long term PT goals Target date: 03/23/2023  Pt will improve hip/knee strength to at least 4+/5 MMT to improve functional strength Baseline: Goal status: New Pt will improve FOTO to at least 64% functional to show improved function Baseline: Goal status: New Pt will improve BERG by at least 3 points in order to demonstrate clinically significant improvement in balance.  Baseline: Goal status: New Pt will improve TUG by 3.4 seconds  or Minimal clinically important difference.  Baseline: Goal status: New Pt will be able to ambulate community distances at least 1000 ft WNL gait pattern and appropriate AD without complaints Baseline: Goal status: New Pt will deny any falls throughout duration of PT.  Baseline: Goal status: New  PLAN: PT FREQUENCY: 1-2 times per week   PT DURATION: 6-8 weeks  PLANNED INTERVENTIONS (unless contraindicated): aquatic PT, Canalith repositioning, cryotherapy, Electrical stimulation, Iontophoresis with 4 mg/ml dexamethasome, Moist heat, traction, Ultrasound, gait training, Therapeutic exercise, balance training, neuromuscular re-education, patient/family education, prosthetic training, manual techniques, passive ROM, dry needling, taping, vasopnuematic device, vestibular, spinal manipulations, joint manipulations  PLAN FOR NEXT SESSION: Update HEP next visit, continue to progress functional mobility, strengthen proximal hip muscles. Improve gait and balance.      Oretha Caprice, PT, MPT 02/09/2023, 12:32 PM

## 2023-02-11 DIAGNOSIS — L820 Inflamed seborrheic keratosis: Secondary | ICD-10-CM | POA: Diagnosis not present

## 2023-02-11 DIAGNOSIS — L570 Actinic keratosis: Secondary | ICD-10-CM | POA: Diagnosis not present

## 2023-02-11 DIAGNOSIS — D1801 Hemangioma of skin and subcutaneous tissue: Secondary | ICD-10-CM | POA: Diagnosis not present

## 2023-02-11 DIAGNOSIS — L218 Other seborrheic dermatitis: Secondary | ICD-10-CM | POA: Diagnosis not present

## 2023-02-11 DIAGNOSIS — L821 Other seborrheic keratosis: Secondary | ICD-10-CM | POA: Diagnosis not present

## 2023-02-16 ENCOUNTER — Encounter: Payer: Self-pay | Admitting: Physical Therapy

## 2023-02-16 ENCOUNTER — Ambulatory Visit (INDEPENDENT_AMBULATORY_CARE_PROVIDER_SITE_OTHER): Payer: Medicare Other | Admitting: Physical Therapy

## 2023-02-16 DIAGNOSIS — R293 Abnormal posture: Secondary | ICD-10-CM | POA: Diagnosis not present

## 2023-02-16 DIAGNOSIS — R262 Difficulty in walking, not elsewhere classified: Secondary | ICD-10-CM

## 2023-02-16 DIAGNOSIS — M25551 Pain in right hip: Secondary | ICD-10-CM

## 2023-02-16 NOTE — Therapy (Addendum)
OUTPATIENT PHYSICAL THERAPY LOWER EXTREMITY  DISCHARGE   Patient Name: [name] MRN: [id number] DOB:07-11-1941, 82 y.o., female Today's Date: 02/16/2023  END OF SESSION:  PT End of Session - 02/16/23 1237     Visit Number 3    Number of Visits 16    Date for PT Re-Evaluation 03/23/23    Authorization Type MEDICARE PART A AND B    Progress Note Due on Visit 10    PT Start Time 1202    PT Stop Time 1230    PT Time Calculation (min) 28 min    Activity Tolerance Patient tolerated treatment well    Behavior During Therapy WFL for tasks assessed/performed               Past Medical History:  Diagnosis Date   Arthritis    CVA (cerebral vascular accident)    Depression    GERD (gastroesophageal reflux disease)    Hypertension    Past Surgical History:  Procedure Laterality Date   CHOLECYSTECTOMY N/A 08/28/2015   Procedure: LAPAROSCOPIC CHOLECYSTECTOMY;  Surgeon: Claud Kelp, MD;  Location: [facility];  Service: General;  Laterality: N/A;   HIATAL HERNIA REPAIR     TOTAL HIP ARTHROPLASTY Right 10/09/2022   Procedure: TOTAL HIP ARTHROPLASTY ANTERIOR APPROACH;  Surgeon: Kathryne Hitch, MD;  Location: [facility];  Service: Orthopedics;  Laterality: Right;   WISDOM TOOTH EXTRACTION     Patient Active Problem List   Diagnosis Date Noted   Status post total replacement of right hip 10/28/2022   Hip fracture 10/09/2022   Leukocytosis 10/09/2022   Hypokalemia 10/09/2022   QT prolongation 10/09/2022   History of stroke 10/09/2022   Essential hypertension 10/09/2022   Depression 10/09/2022   Basicervical fracture of neck of right femur 10/09/2022   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   PERTINENT HISTORY: Arthritis, CVA, Depression, GERD, HTN PAIN:  Are you having pain? No not at present  PRECAUTIONS: Anterior hip and Fall  WEIGHT BEARING RESTRICTIONS: No  FALLS:  Has patient fallen in last 6 months? Yes. Number of falls 1x since surgery, but once a week prior to sx.   LIVING ENVIRONMENT: Lives with: lives alone Lives in: House/apartment Stairs: No Has following equipment at home: Single point cane, Quad cane large base, Walker - 2 wheeled, shower chair, and Grab bars  OCCUPATION: Retired   PLOF: Independent  PATIENT GOALS: Pt would like to improve gait, balance and cardiovascular endurance.   OBJECTIVE:   DIAGNOSTIC FINDINGS: Expected changes post right total hip arthroplasty.   PATIENT SURVEYS:  FOTO 55.1935%, 64% in 13 visits.   COGNITION: Overall cognitive status: Within functional limits for tasks assessed     SENSATION: WFL  POSTURE: rounded shoulders and forward head  PALPATION: No pain or tenderness  LOWER EXTREMITY ROM:  Active ROM Right eval Left eval  Hip flexion [facility]  Hip extension    Hip abduction [facility]  Knee flexion [facility] WFL  Knee extension WFL WFL   (Blank rows = not tested)  LOWER  EXTREMITY MMT:  MMT Right eval Left eval  Hip flexion 4 4+  Hip extension    Knee flexion 4 4+  Knee extension 4- 4+   (Blank rows = not tested)  FUNCTIONAL TESTS:  5 times sit to stand: 12.60 sec with definite use of hands.  Timed up and go (TUG): 18.84 sec  BERG BALANCE TEST Sitting to Standing: 3.      Stands independently using hands Standing Unsupported: 4.      Stands safely for 2 minutes Sitting Unsupported: 4.     Sits for 2 minutes independently Standing to Sitting: 3.     Controls descent with hands   Transfers: 3.     Transfers safely definite use of hands Standing with eyes closed: 4.     Stands safely for 10 seconds  Standing with feet together: 3.     Stands for 1 minute with supervision Reaching forward with outstretched arm: 3.     Reaches forward 5 inches Retrieving object from the floor: 3.     Able to pick up with supervision Turning to look behind: 4.     Looks behind from both sides and weight shifts well Turning 360 degrees: 3.     Able to turn on one side in </= 4 seconds Place alternate foot on stool: 2.     4 steps without assistance/supervision Standing with one foot in front: 0.     Loses balance while standing/stepping Standing on one foot: 0.     Unable  Total Score: 39/56   GAIT: Distance walked: 57ft  Assistive device utilized: Single point cane Level of assistance: Complete Independence Comments: slow shuffled gait pattern.    TODAY'S TREATMENT:   02/16/23:  TherEx:  Nustep: x 5 Level 4 x 5 minutes Standing Calf raises x 15 c UE support  Standing hip abd x 15 each LE c UE support Standing marching x 20 c UE support Step ups on 6 inch step x 15 c UE support Standing on Airex mat feet apart x 30 seconds with close supervision, standing modified tandem x 30 seconds with intermittent UE support,     02/09/23:  TherEx:  Supine: bridges x 10 holding 5 sec Supine SLR: x 10 each LE  Supine marching x 30  Supine ball squeezes x 15 holding 5 sec Supine clam shells x 15  Standing calf raises x 15 Standing hip abd x 15 each LE c UE support Standing marching x 20 c UE support Step ups on 4 inch step x 10 c UE support Standing on Airex mat feet apart x 30 seconds with close supervision, standing with eyes closed on Airex x 10 seconds with close supervision and increased anterior posterior sway.                                                                                                                               DATE: Creating, reviewing, and  completing  below HEP    PATIENT EDUCATION:  Education details: Educated pt on anatomy and physiology of current symptoms, FOTO, diagnosis, prognosis, HEP,  and POC. Person educated: Patient Education method: Medical illustrator Education comprehension: verbalized understanding and returned demonstration  HOME EXERCISE PROGRAM: Access Code: [email or web address] URL: [email or web address] Date: 02/16/2023 Prepared by: Narda Amber  Exercises - Seated Long Arc Quad  - 2 x daily - 7 x weekly - 2 sets - 10 reps - Seated Hip Adduction Isometrics with Ball  - 2 x daily - 7 x weekly - 2 sets - 10 reps - Standing Hip Abduction with Counter Support  - 2 x daily - 7 x weekly - 20 reps - Standing March with Counter Support  - 2 x daily - 7 x weekly - 20 reps - Heel Raises with Counter Support  - 2 x daily - 7 x weekly - 20 reps  ASSESSMENT:  CLINICAL IMPRESSION: Pt arriving 20 minutes late to appointment. Pt stating she was stuck at gas station and unable to open her gas nozzle. Pt tolerating exercises well, pt's HEP was updated with more standing exercises to progress overall strength and balance. Continue skilled PT to maximize pt's function.   OBJECTIVE IMPAIRMENTS: decreased activity tolerance, difficulty walking, decreased balance, decreased endurance, decreased mobility, decreased ROM, decreased strength, impaired flexibility, impaired LE use, postural dysfunction, and pain.  ACTIVITY LIMITATIONS: bending, lifting, carry, locomotion, cleaning, community activity, driving, and or occupation  PERSONAL FACTORS: Arthritis, CVA, Depression, GERD, HTN are also affecting patient's functional outcome.  REHAB POTENTIAL: Good  CLINICAL DECISION MAKING: Stable/uncomplicated  EVALUATION COMPLEXITY: Low    GOALS: Short term PT Goals Target date: 02/02/2023 Pt will be I and compliant with HEP. Baseline:  Goal status: MET 02/09/23 Pt will decrease pain by 25% overall Baseline: Goal  status: On-going 02/09/23  Long term PT goals Target date: 03/23/2023  Pt will improve hip/knee strength to at least 4+/5 MMT to improve functional strength Baseline: Goal status: New Pt will improve FOTO to at least 64% functional to show improved function Baseline: Goal status: New Pt will improve BERG by at least 3 points in order to demonstrate clinically significant improvement in balance.  Baseline: Goal status: New Pt will improve TUG by 3.4 seconds or Minimal clinically important difference.  Baseline: Goal status: New Pt will be able to ambulate community distances at least 1000 ft WNL gait pattern and appropriate AD without complaints Baseline: Goal status: New Pt will deny any falls throughout duration of PT.  Baseline: Goal status: New  PLAN: PT FREQUENCY: 1-2 times per week   PT DURATION: 6-8 weeks  PLANNED INTERVENTIONS (unless contraindicated): aquatic PT, Canalith repositioning, cryotherapy, Electrical stimulation, Iontophoresis with 4 mg/ml dexamethasome, Moist heat, traction, Ultrasound, gait training, Therapeutic exercise, balance training, neuromuscular re-education, patient/family education, prosthetic training, manual techniques, passive ROM, dry needling, taping, vasopnuematic device, vestibular, spinal manipulations, joint manipulations  PLAN FOR NEXT SESSION: continue to progress functional mobility, strengthen proximal hip muscles. Improve gait and balance.      Sharmon Leyden, PT, MPT 02/16/2023, 12:40 PM  PHYSICAL THERAPY DISCHARGE SUMMARY  Visits from Start of Care: 3/16  Current functional level related to goals / functional outcomes: See above   Remaining deficits: See above   Education / Equipment: HEP   Patient agrees to discharge. Patient goals were not met. Patient is being discharged due to the patient's request.

## 2023-02-22 ENCOUNTER — Ambulatory Visit (INDEPENDENT_AMBULATORY_CARE_PROVIDER_SITE_OTHER): Payer: Medicare Other | Admitting: Orthopaedic Surgery

## 2023-02-22 ENCOUNTER — Encounter: Payer: Self-pay | Admitting: Orthopaedic Surgery

## 2023-02-22 DIAGNOSIS — Z96641 Presence of right artificial hip joint: Secondary | ICD-10-CM | POA: Diagnosis not present

## 2023-02-22 NOTE — Progress Notes (Signed)
The patient is an 83 year old female who is now 4 and half months status post a right total hip replacement to treat the displaced right hip femoral neck fracture.  She does ambulate with a cane.  We sent her to outpatient physical therapy because she was having some balance issues.  She just finished therapy.  She is trying to do some exercises at home but does report good range of motion and strength and says she is doing well.  Her right operative hip moves smoothly and fluidly with no issues at all.  We had a long and thorough discussion about when to use assistive devices for mobility and to continue home exercise program.  She can stop outpatient therapy.  All questions and concerns were answered and addressed.  Follow-up at this point is as needed.

## 2023-03-01 ENCOUNTER — Encounter: Payer: Medicare Other | Admitting: Physical Therapy

## 2023-03-01 ENCOUNTER — Telehealth: Payer: Self-pay | Admitting: Physical Therapy

## 2023-03-01 NOTE — Telephone Encounter (Signed)
I called pt after she missed her PT appointment this morning at 11:00. Pt stating after her visits with Dr. Magnus Ivan on 02/22/23 that he was discharging her on an as needed basis. Pt wishing not to make any further appointments.   Narda Amber, PT, MPT 03/01/23 12:13 PM

## 2023-04-27 DIAGNOSIS — H3500 Unspecified background retinopathy: Secondary | ICD-10-CM | POA: Diagnosis not present

## 2023-04-27 DIAGNOSIS — H353132 Nonexudative age-related macular degeneration, bilateral, intermediate dry stage: Secondary | ICD-10-CM | POA: Diagnosis not present

## 2023-04-27 DIAGNOSIS — H5201 Hypermetropia, right eye: Secondary | ICD-10-CM | POA: Diagnosis not present

## 2023-04-27 DIAGNOSIS — H47021 Hemorrhage in optic nerve sheath, right eye: Secondary | ICD-10-CM | POA: Diagnosis not present

## 2023-10-22 DIAGNOSIS — Z961 Presence of intraocular lens: Secondary | ICD-10-CM | POA: Diagnosis not present

## 2023-10-22 DIAGNOSIS — H353132 Nonexudative age-related macular degeneration, bilateral, intermediate dry stage: Secondary | ICD-10-CM | POA: Diagnosis not present

## 2023-11-01 DIAGNOSIS — Z0000 Encounter for general adult medical examination without abnormal findings: Secondary | ICD-10-CM | POA: Diagnosis not present

## 2023-11-01 DIAGNOSIS — Z79899 Other long term (current) drug therapy: Secondary | ICD-10-CM | POA: Diagnosis not present

## 2023-11-01 DIAGNOSIS — W19XXXA Unspecified fall, initial encounter: Secondary | ICD-10-CM | POA: Diagnosis not present

## 2023-11-01 DIAGNOSIS — E559 Vitamin D deficiency, unspecified: Secondary | ICD-10-CM | POA: Diagnosis not present

## 2023-11-01 DIAGNOSIS — E785 Hyperlipidemia, unspecified: Secondary | ICD-10-CM | POA: Diagnosis not present

## 2023-11-01 DIAGNOSIS — R32 Unspecified urinary incontinence: Secondary | ICD-10-CM | POA: Diagnosis not present

## 2023-11-01 DIAGNOSIS — E538 Deficiency of other specified B group vitamins: Secondary | ICD-10-CM | POA: Diagnosis not present

## 2023-11-01 DIAGNOSIS — K529 Noninfective gastroenteritis and colitis, unspecified: Secondary | ICD-10-CM | POA: Diagnosis not present

## 2023-11-01 DIAGNOSIS — Z8673 Personal history of transient ischemic attack (TIA), and cerebral infarction without residual deficits: Secondary | ICD-10-CM | POA: Diagnosis not present

## 2023-11-01 DIAGNOSIS — I10 Essential (primary) hypertension: Secondary | ICD-10-CM | POA: Diagnosis not present

## 2023-12-25 ENCOUNTER — Other Ambulatory Visit: Payer: Self-pay

## 2023-12-25 ENCOUNTER — Emergency Department (HOSPITAL_COMMUNITY): Admission: EM | Admit: 2023-12-25 | Discharge: 2024-01-15 | Disposition: E | Payer: Medicare Other

## 2023-12-25 ENCOUNTER — Encounter (HOSPITAL_COMMUNITY): Payer: Self-pay

## 2023-12-25 DIAGNOSIS — R0689 Other abnormalities of breathing: Secondary | ICD-10-CM | POA: Insufficient documentation

## 2023-12-25 DIAGNOSIS — R531 Weakness: Secondary | ICD-10-CM | POA: Diagnosis not present

## 2023-12-25 DIAGNOSIS — Z7982 Long term (current) use of aspirin: Secondary | ICD-10-CM | POA: Insufficient documentation

## 2023-12-25 DIAGNOSIS — I469 Cardiac arrest, cause unspecified: Secondary | ICD-10-CM | POA: Diagnosis not present

## 2023-12-25 DIAGNOSIS — R079 Chest pain, unspecified: Secondary | ICD-10-CM | POA: Insufficient documentation

## 2023-12-25 DIAGNOSIS — M7918 Myalgia, other site: Secondary | ICD-10-CM | POA: Diagnosis not present

## 2023-12-25 DIAGNOSIS — R1110 Vomiting, unspecified: Secondary | ICD-10-CM | POA: Insufficient documentation

## 2023-12-25 DIAGNOSIS — R0602 Shortness of breath: Secondary | ICD-10-CM | POA: Insufficient documentation

## 2023-12-25 DIAGNOSIS — R112 Nausea with vomiting, unspecified: Secondary | ICD-10-CM | POA: Diagnosis not present

## 2023-12-25 DIAGNOSIS — I10 Essential (primary) hypertension: Secondary | ICD-10-CM | POA: Insufficient documentation

## 2023-12-25 DIAGNOSIS — Z20822 Contact with and (suspected) exposure to covid-19: Secondary | ICD-10-CM | POA: Insufficient documentation

## 2023-12-25 DIAGNOSIS — R110 Nausea: Secondary | ICD-10-CM | POA: Diagnosis not present

## 2023-12-25 DIAGNOSIS — R1111 Vomiting without nausea: Secondary | ICD-10-CM | POA: Diagnosis not present

## 2023-12-25 LAB — CBG MONITORING, ED: Glucose-Capillary: 121 mg/dL — ABNORMAL HIGH (ref 70–99)

## 2023-12-25 LAB — RESP PANEL BY RT-PCR (RSV, FLU A&B, COVID)  RVPGX2
Influenza A by PCR: NEGATIVE
Influenza B by PCR: NEGATIVE
Resp Syncytial Virus by PCR: NEGATIVE
SARS Coronavirus 2 by RT PCR: NEGATIVE

## 2023-12-25 MED ORDER — ROCURONIUM BROMIDE 10 MG/ML (PF) SYRINGE
PREFILLED_SYRINGE | INTRAVENOUS | Status: DC | PRN
  Administered 2023-12-25: 20 mg via INTRAVENOUS

## 2023-12-25 MED ORDER — EPINEPHRINE 1 MG/10ML IJ SOSY
PREFILLED_SYRINGE | INTRAMUSCULAR | Status: DC | PRN
  Administered 2023-12-25 (×3): 1 mg via INTRAVENOUS

## 2023-12-25 MED ORDER — ETOMIDATE 2 MG/ML IV SOLN
INTRAVENOUS | Status: DC | PRN
  Administered 2023-12-25: 20 mg via INTRAVENOUS

## 2023-12-25 MED ORDER — CALCIUM CHLORIDE 10 % IV SOLN
INTRAVENOUS | Status: DC | PRN
  Administered 2023-12-25: 1 g via INTRAVENOUS

## 2023-12-25 MED ORDER — SODIUM BICARBONATE 8.4 % IV SOLN
INTRAVENOUS | Status: DC | PRN
  Administered 2023-12-25: 50 meq via INTRAVENOUS

## 2024-01-15 NOTE — ED Provider Notes (Signed)
  EMERGENCY DEPARTMENT AT [facility] Provider Note   CSN: [id number] Arrival date & time: 01-07-2024  1618     History  Chief Complaint  Patient presents with   FLU SX   Emesis    [name] is a 83 y.o. female.  83 year old female presenting emergency department for flulike symptoms from urgent care reportedly having vomiting and bodyaches and chest pain for the past 2 days.  Patient was triaged, but then stated she needed to go to the bathroom.  Then went unresponsive with agonal breathing.  Unable to feel pulse.  CPR initiated.   Emesis      Home Medications Prior to Admission medications   Medication Sig Start Date End Date Taking? Authorizing Provider  aspirin  81 MG chewable tablet Chew 1 tablet (81 mg total) by mouth 2 (two) times daily. 10/12/22   Vernetta Lonni GRADE, MD  Biotin 5000 MCG CAPS Take 5,000 mcg by mouth daily.    [provider]  Cyanocobalamin  (VITAMIN B-12 PO) Take 1 tablet by mouth daily.    [provider]  FLUoxetine  (PROZAC ) 40 MG capsule Take 40 mg by mouth daily.    [provider]  HYDROcodone -acetaminophen  (NORCO/VICODIN) 5-325 MG tablet Take 1-2 tablets by mouth every 4 (four) hours as needed for moderate pain (pain score 4-6). 10/12/22   Vernetta Lonni GRADE, MD  Multiple Vitamins-Minerals (ICAPS AREDS 2 PO) Take 1 tablet by mouth in the morning and at bedtime.    [provider]  REPATHA SURECLICK 140 MG/ML SOAJ Inject 140 mg into the skin every 14 (fourteen) days. Every other Thursday    [provider]  Vitamin D, Cholecalciferol, 25 MCG (1000 UT) TABS Take 3,000 tablets by mouth daily.    [provider]      Allergies    Ambien [zolpidem tartrate], Crestor  [rosuvastatin calcium ], Lisinopril, Sulfa antibiotics, and Penicillins    Review of Systems   Review of Systems  Gastrointestinal:  Positive for vomiting.    Physical Exam Updated Vital  Signs BP (!) 158/89   Pulse 81   Temp 98.3 F (36.8 C) (Oral)   Resp 17   Ht 5' (1.524 m)   Wt 59 kg   SpO2 99%   BMI 25.39 kg/m  Physical Exam Constitutional:      General: She is in acute distress.     Appearance: She is ill-appearing.  HENT:     Head: Normocephalic and atraumatic.     Mouth/Throat:     Mouth: Mucous membranes are dry.  Eyes:     Pupils: Pupils are equal, round, and reactive to light.     Comments: Patient with agonal breathing.  Right-sided gaze.  Cardiovascular:     Comments: Initially with faint pulses femoral, however lost pulses  Pulmonary:     Comments: Agonal breathing. Abdominal:     General: There is no distension.  Skin:    General: Skin is warm.     Capillary Refill: Capillary refill takes less than 2 seconds.  Neurological:     Comments: No purposeful movement or response to painful stimuli.  Appeared to have decerebrate posturing at 1 point  Psychiatric:     Comments: Not possible to assess     ED Results / Procedures / Treatments   Labs (all labs ordered are listed, but only abnormal results are displayed) Labs Reviewed  CBG MONITORING, ED - Abnormal; Notable for the following components:  Result Value   Glucose-Capillary 121 (*)    All other components within normal limits  RESP PANEL BY RT-PCR (RSV, FLU A&B, COVID)  [id number]    EKG None  Radiology No results found.  Procedures Procedure Name: Intubation Date/Time: 01/05/2024 5:35 PM  Performed by: Neysa Caron PARAS, DOPre-anesthesia Checklist: Patient identified, Patient being monitored, Emergency Drugs available, Timeout performed and Suction available Oxygen Delivery Method: Non-rebreather mask Preoxygenation: Pre-oxygenation with 100% oxygen Induction Type: Rapid sequence Ventilation: Mask ventilation without difficulty Laryngoscope Size: Glidescope Grade View: Grade III Tube size: 7.5 mm Number of attempts: 1 Airway Equipment and Method: Rigid stylet and  Video-laryngoscopy Placement Confirmation: ETT inserted through vocal cords under direct vision, CO2 detector and Breath sounds checked- equal and bilateral Secured at: 25 cm Difficulty Due To: Difficulty was anticipated    CPR  Date/Time: Jan 05, 2024 5:37 PM  Performed by: Neysa Caron PARAS, DO Authorized by: Neysa Caron PARAS, DO  CPR Procedure Details:    ACLS/BLS initiated by EMS: No     CPR/ACLS performed in the ED: Yes     Duration of CPR (minutes):  30   Outcome: Pt declared dead    CPR performed via ACLS guidelines under my direct supervision.  See RN documentation for details including defibrillator use, medications, doses and timing.     Medications Ordered in ED Medications - No data to display  ED Course/ Medical Decision Making/ A&P                                 Medical Decision Making This is an 83 year old female who presented to the emergency department with reportedly flulike symptoms generalized malaise with nausea vomiting for the past 2 days.  Initially triaged with hypertension, no fever or tachycardia.  No hypoxia noted.  Shortly after triage went unresponsive with agonal breathing.  2 rounds of CPR with 1 of epi given with short lived ROSC. Patient intubated for airway protection, pulses lost shortly after intubation.  CPR resumed and remained in PEA.  Given epi, calcium  bicarb.  Blood sugar normal.  Family updated during CPR and confirmed patient full code.  Unfortunately after 30 minutes of maximal intervention unable to obtain ROSC.  Patient with cardiac standstill on bedside ultrasound.  Time of death 5:23pm.  Family notified of patient's passing.         Final Clinical Impression(s) / ED Diagnoses Final diagnoses:  None    Rx / DC Orders ED Discharge Orders     None         Neysa Caron PARAS, DO 2024/01/05 1743

## 2024-01-15 NOTE — ED Triage Notes (Signed)
 Pt to ED via [facility] from [facility] c/o flu like symptoms x 2 days. Vomiting, fatigue, body aches.  No medications given by EMS. Pt refused IV access.  Last VS: 132/60, hr 82, 99%RA, CBG 194.

## 2024-01-15 NOTE — Code Documentation (Signed)
 Compressions resumed.

## 2024-01-15 NOTE — Code Documentation (Signed)
 Pulse check = no pulse, compressions resumed

## 2024-01-15 NOTE — Code Documentation (Signed)
Patient time of death occurred at 1723. 

## 2024-01-15 NOTE — ED Notes (Signed)
 This RN and NT to triage room to assist pt to bathroom per her request. Pt noted sitting in WC, pt unresponsive, faint pulse noted , slow breathing. This RN to yell for assistance. This RN, Adam RN, Siobhan NT, Omar league to assist pt in stretcher and go to ED room 9.

## 2024-01-15 NOTE — Code Documentation (Signed)
 Pulse check, no Pulse, compressions resumed

## 2024-01-15 NOTE — Code Documentation (Signed)
 Pulse Check- positive pulse

## 2024-01-15 NOTE — Code Documentation (Signed)
Pulse check: no pulse 

## 2024-01-15 NOTE — Code Documentation (Signed)
Pulse check.  No pulse, compressions resumed

## 2024-01-15 NOTE — Progress Notes (Signed)
   01/09/2024 1830  Spiritual Encounters  Type of Visit Initial  Care provided to: Family  Conversation partners present during encounter Nurse  Referral source Code page  Reason for visit Patient death  OnCall Visit Yes   Chaplain responded to patient's death. Chaplain met with patient's Daughter [name] - [phone]) and a family friend. Chaplain offered prayer and support for family after death. Chaplain helped family transition to patient's room. Chaplain, upon request from family and clearance from RN, removed patient's jewelry to give to family. Patient's daughter indicated they would like to use [name] [place] for funeral arrangements and that those details are already established. Chaplain extended hospitality.Spiritual care services available as needed.   Juliene CHRISTELLA Das, Chaplain Jan 09, 2024

## 2024-01-15 NOTE — Code Documentation (Signed)
No pulse, compressions resumed  

## 2024-01-15 NOTE — Code Documentation (Signed)
 No pulse. Resume compressions.

## 2024-01-15 NOTE — Code Documentation (Signed)
Pulse check-- no pulse--- compressions resumed 

## 2024-01-15 NOTE — Code Documentation (Signed)
 Young MD to place ET tube

## 2024-01-15 DEATH — deceased
